# Patient Record
Sex: Female | Born: 1990
Health system: Southern US, Community
[De-identification: ages and names within clinical notes are randomized; demographics above are authoritative.]

## PROBLEM LIST (undated history)

## (undated) ENCOUNTER — Inpatient Hospital Stay (HOSPITAL_COMMUNITY): Payer: Self-pay

## (undated) DIAGNOSIS — R87619 Unspecified abnormal cytological findings in specimens from cervix uteri: Secondary | ICD-10-CM

## (undated) DIAGNOSIS — R87629 Unspecified abnormal cytological findings in specimens from vagina: Secondary | ICD-10-CM

## (undated) DIAGNOSIS — IMO0002 Reserved for concepts with insufficient information to code with codable children: Secondary | ICD-10-CM

## (undated) DIAGNOSIS — F419 Anxiety disorder, unspecified: Secondary | ICD-10-CM

## (undated) DIAGNOSIS — L709 Acne, unspecified: Secondary | ICD-10-CM

## (undated) DIAGNOSIS — N390 Urinary tract infection, site not specified: Secondary | ICD-10-CM

## (undated) DIAGNOSIS — I959 Hypotension, unspecified: Secondary | ICD-10-CM

## (undated) DIAGNOSIS — F329 Major depressive disorder, single episode, unspecified: Secondary | ICD-10-CM

## (undated) DIAGNOSIS — F32A Depression, unspecified: Secondary | ICD-10-CM

## (undated) HISTORY — PX: WISDOM TOOTH EXTRACTION: SHX21

---
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
## 2009-01-09 ENCOUNTER — Emergency Department (HOSPITAL_COMMUNITY): Admission: EM | Admit: 2009-01-09 | Discharge: 2009-01-09 | Payer: Self-pay | Admitting: Emergency Medicine

## 2009-05-20 ENCOUNTER — Emergency Department (HOSPITAL_COMMUNITY): Admission: EM | Admit: 2009-05-20 | Discharge: 2009-05-20 | Payer: Self-pay | Admitting: Family Medicine

## 2009-09-14 ENCOUNTER — Emergency Department (HOSPITAL_COMMUNITY): Admission: EM | Admit: 2009-09-14 | Discharge: 2009-09-14 | Payer: Self-pay | Admitting: Emergency Medicine

## 2009-11-07 ENCOUNTER — Emergency Department (HOSPITAL_COMMUNITY): Admission: EM | Admit: 2009-11-07 | Discharge: 2009-11-07 | Payer: Self-pay | Admitting: Family Medicine

## 2009-11-23 ENCOUNTER — Emergency Department (HOSPITAL_COMMUNITY): Admission: EM | Admit: 2009-11-23 | Discharge: 2009-11-23 | Payer: Self-pay | Admitting: Emergency Medicine

## 2009-12-03 ENCOUNTER — Emergency Department (HOSPITAL_COMMUNITY): Admission: EM | Admit: 2009-12-03 | Discharge: 2009-12-03 | Payer: Self-pay | Admitting: Emergency Medicine

## 2010-11-28 ENCOUNTER — Encounter: Payer: Self-pay | Admitting: *Deleted

## 2011-01-23 LAB — POCT URINALYSIS DIP (DEVICE)
Bilirubin Urine: NEGATIVE
Glucose, UA: NEGATIVE mg/dL
Ketones, ur: NEGATIVE mg/dL
Nitrite: NEGATIVE
Protein, ur: NEGATIVE mg/dL
Specific Gravity, Urine: 1.025 (ref 1.005–1.030)
Urobilinogen, UA: 0.2 mg/dL (ref 0.0–1.0)
pH: 6.5 (ref 5.0–8.0)

## 2011-01-23 LAB — GC/CHLAMYDIA PROBE AMP, GENITAL
Chlamydia, DNA Probe: NEGATIVE
GC Probe Amp, Genital: NEGATIVE

## 2011-01-23 LAB — WET PREP, GENITAL
Clue Cells Wet Prep HPF POC: NONE SEEN
Yeast Wet Prep HPF POC: NONE SEEN

## 2011-01-23 LAB — POCT PREGNANCY, URINE: Preg Test, Ur: NEGATIVE

## 2011-02-13 LAB — POCT URINALYSIS DIP (DEVICE)
Bilirubin Urine: NEGATIVE
Glucose, UA: NEGATIVE mg/dL
Hgb urine dipstick: NEGATIVE
Ketones, ur: NEGATIVE mg/dL
Nitrite: NEGATIVE
Protein, ur: NEGATIVE mg/dL
Specific Gravity, Urine: 1.025 (ref 1.005–1.030)
Urobilinogen, UA: 0.2 mg/dL (ref 0.0–1.0)
pH: 6.5 (ref 5.0–8.0)

## 2011-02-13 LAB — WET PREP, GENITAL
Trich, Wet Prep: NONE SEEN
Yeast Wet Prep HPF POC: NONE SEEN

## 2011-02-13 LAB — POCT PREGNANCY, URINE: Preg Test, Ur: NEGATIVE

## 2011-02-13 LAB — GC/CHLAMYDIA PROBE AMP, GENITAL
Chlamydia, DNA Probe: NEGATIVE
GC Probe Amp, Genital: NEGATIVE

## 2011-02-17 LAB — POCT RAPID STREP A (OFFICE): Streptococcus, Group A Screen (Direct): POSITIVE — AB

## 2011-02-22 LAB — URINE MICROSCOPIC-ADD ON

## 2011-02-22 LAB — URINALYSIS, ROUTINE W REFLEX MICROSCOPIC
Bilirubin Urine: NEGATIVE
Glucose, UA: NEGATIVE mg/dL
Hgb urine dipstick: NEGATIVE
Ketones, ur: 15 mg/dL — AB
Leukocytes, UA: NEGATIVE
Nitrite: NEGATIVE
Protein, ur: 30 mg/dL — AB
Specific Gravity, Urine: 1.035 — ABNORMAL HIGH (ref 1.005–1.030)
Urobilinogen, UA: 0.2 mg/dL (ref 0.0–1.0)
pH: 6 (ref 5.0–8.0)

## 2011-02-22 LAB — POCT I-STAT, CHEM 8
BUN: 8 mg/dL (ref 6–23)
Calcium, Ion: 1.14 mmol/L (ref 1.12–1.32)
Chloride: 113 mEq/L — ABNORMAL HIGH (ref 96–112)
Creatinine, Ser: 0.8 mg/dL (ref 0.4–1.2)
Glucose, Bld: 92 mg/dL (ref 70–99)
HCT: 41 % (ref 36.0–46.0)
Hemoglobin: 13.9 g/dL (ref 12.0–15.0)
Potassium: 3.2 mEq/L — ABNORMAL LOW (ref 3.5–5.1)
Sodium: 139 mEq/L (ref 135–145)
TCO2: 15 mmol/L (ref 0–100)

## 2011-02-22 LAB — POCT PREGNANCY, URINE: Preg Test, Ur: NEGATIVE

## 2011-03-22 NOTE — Op Note (Signed)
NAMEWANDRA, BABIN                 ACCOUNT NO.:  [id number]   MEDICAL RECORD NO.:  [id number]          PATIENT TYPE:  INP   LOCATION:  9129                          FACILITY:  [facility]   PHYSICIAN:  Lazaro Arms, M.D.   DATE OF BIRTH:  June 02, 1991   DATE OF PROCEDURE:  06/04/2007  DATE OF DISCHARGE:                               OPERATIVE REPORT   DELIVERY NOTE   Victoria Wright is a 20 year old African-American female gravida 1, para 0,  abortus 0 with estimate date of delivery of June 05, 2007, currently at  39-6/7 weeks' gestation, who was admitted today with some bloody show in  early labor, underwent an amniotomy and was augmented.  She progressed  slowly throughout the day, was 4 cm at about 10 o'clock this morning and  was complete and started pushing at 0030. She had approximately 40  minute second stage of labor.  She had an epidural placed.  The patient  delivered over a midline episiotomy a viable female at 3 with Apgars of  7 and 9 weighing 8 pounds and 10 ounces.  There was a three-vessel cord.  Cord blood and cord gas were sent.  However, they said the cord gas was  insufficient for study.  A midline episiotomy was without extension and  was repaired with 3-0 Monocryl in the usual fashion without difficulty.  The uterus was firm at the umbilicus.  Blood loss for delivery was 300  mL.  Again, the uterus was firm.  The infant will undergo routine  neonatal care.  Of note, the patient did have a temperature right at  time of delivery of 100.6.  An hour before delivery it was 99.5.  The  nursery will be made aware of that.  She has not received any  antibiotics. Temperature just went up right at the end.  The patient  will undergo routine postpartum care.      Lazaro Arms, M.D.  Electronically Signed     LHE/MEDQ  D:  06/04/2007  T:  06/04/2007  Job:  [id number]
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
## 2011-04-20 ENCOUNTER — Emergency Department (HOSPITAL_COMMUNITY)
Admission: EM | Admit: 2011-04-20 | Discharge: 2011-04-21 | Disposition: A | Discharge status: Home / Self Care | Payer: Self-pay | Attending: Emergency Medicine | Admitting: Emergency Medicine

## 2011-04-20 DIAGNOSIS — R42 Dizziness and giddiness: Secondary | ICD-10-CM | POA: Insufficient documentation

## 2011-04-20 DIAGNOSIS — N898 Other specified noninflammatory disorders of vagina: Secondary | ICD-10-CM | POA: Insufficient documentation

## 2011-04-20 DIAGNOSIS — B9689 Other specified bacterial agents as the cause of diseases classified elsewhere: Secondary | ICD-10-CM | POA: Insufficient documentation

## 2011-04-20 DIAGNOSIS — N760 Acute vaginitis: Secondary | ICD-10-CM | POA: Insufficient documentation

## 2011-04-20 DIAGNOSIS — N9489 Other specified conditions associated with female genital organs and menstrual cycle: Secondary | ICD-10-CM | POA: Insufficient documentation

## 2011-04-20 DIAGNOSIS — A499 Bacterial infection, unspecified: Secondary | ICD-10-CM | POA: Insufficient documentation

## 2011-04-20 LAB — BASIC METABOLIC PANEL
BUN: 14 mg/dL (ref 6–23)
CO2: 25 mEq/L (ref 19–32)
Calcium: 9.1 mg/dL (ref 8.4–10.5)
Chloride: 104 mEq/L (ref 96–112)
Creatinine, Ser: 0.62 mg/dL (ref 0.4–1.2)
GFR calc Af Amer: >60 mL/min (ref >60)
GFR calc non Af Amer: >60 mL/min (ref >60)
Glucose, Bld: 88 mg/dL (ref 70–99)
Potassium: 3.8 mEq/L (ref 3.5–5.1)
Sodium: 137 mEq/L (ref 135–145)

## 2011-04-20 LAB — DIFFERENTIAL
Basophils Absolute: 0 10*3/uL (ref 0.0–0.1)
Basophils Relative: 1 % (ref 0–1)
Eosinophils Absolute: 0.2 10*3/uL (ref 0.0–0.7)
Eosinophils Relative: 3 % (ref 0–5)
Lymphocytes Relative: 36 % (ref 12–46)
Lymphs Abs: 1.9 10*3/uL (ref 0.7–4.0)
Monocytes Absolute: 0.5 10*3/uL (ref 0.1–1.0)
Monocytes Relative: 9 % (ref 3–12)
Neutro Abs: 2.7 10*3/uL (ref 1.7–7.7)
Neutrophils Relative %: 52 % (ref 43–77)

## 2011-04-20 LAB — CBC
HCT: 34.7 % — ABNORMAL LOW (ref 36.0–46.0)
Hemoglobin: 12.2 g/dL (ref 12.0–15.0)
MCH: 30.7 pg (ref 26.0–34.0)
MCHC: 35.2 g/dL (ref 30.0–36.0)
MCV: 87.2 fL (ref 78.0–100.0)
Platelets: 224 10*3/uL (ref 150–400)
RBC: 3.98 MIL/uL (ref 3.87–5.11)
RDW: 11.9 % (ref 11.5–15.5)
WBC: 5.3 10*3/uL (ref 4.0–10.5)

## 2011-04-21 LAB — URINALYSIS, ROUTINE W REFLEX MICROSCOPIC
Bilirubin Urine: NEGATIVE
Glucose, UA: NEGATIVE mg/dL
Hgb urine dipstick: NEGATIVE
Ketones, ur: NEGATIVE mg/dL
Nitrite: NEGATIVE
Protein, ur: NEGATIVE mg/dL
Specific Gravity, Urine: 1.027 (ref 1.005–1.030)
Urobilinogen, UA: 1 mg/dL (ref 0.0–1.0)
pH: 7 (ref 5.0–8.0)

## 2011-04-21 LAB — WET PREP, GENITAL
Trich, Wet Prep: NONE SEEN
Yeast Wet Prep HPF POC: NONE SEEN

## 2011-04-21 LAB — URINE MICROSCOPIC-ADD ON

## 2011-04-21 LAB — POCT PREGNANCY, URINE: Preg Test, Ur: NEGATIVE

## 2011-04-22 LAB — GC/CHLAMYDIA PROBE AMP, GENITAL
Chlamydia, DNA Probe: NEGATIVE
GC Probe Amp, Genital: NEGATIVE

## 2011-05-14 ENCOUNTER — Inpatient Hospital Stay (INDEPENDENT_AMBULATORY_CARE_PROVIDER_SITE_OTHER)
Admission: RE | Admit: 2011-05-14 | Discharge: 2011-05-14 | Disposition: A | Discharge status: Home / Self Care | Payer: Self-pay | Source: Ambulatory Visit | Attending: Family Medicine | Admitting: Family Medicine

## 2011-05-14 DIAGNOSIS — A09 Infectious gastroenteritis and colitis, unspecified: Secondary | ICD-10-CM

## 2011-05-14 DIAGNOSIS — R197 Diarrhea, unspecified: Secondary | ICD-10-CM

## 2011-05-14 DIAGNOSIS — B9689 Other specified bacterial agents as the cause of diseases classified elsewhere: Secondary | ICD-10-CM

## 2011-05-14 DIAGNOSIS — R109 Unspecified abdominal pain: Secondary | ICD-10-CM

## 2011-05-14 DIAGNOSIS — R10819 Abdominal tenderness, unspecified site: Secondary | ICD-10-CM

## 2011-05-14 LAB — CLOSTRIDIUM DIFFICILE BY PCR: Toxigenic C. Difficile by PCR: POSITIVE — AB

## 2011-05-16 LAB — GIARDIA/CRYPTOSPORIDIUM SCREEN(EIA)
Cryptosporidium Screen (EIA): NEGATIVE
Giardia Screen - EIA: NEGATIVE

## 2011-05-18 ENCOUNTER — Emergency Department (HOSPITAL_COMMUNITY)
Admission: EM | Admit: 2011-05-18 | Discharge: 2011-05-18 | Disposition: A | Discharge status: Home / Self Care | Payer: Self-pay | Attending: Emergency Medicine | Admitting: Emergency Medicine

## 2011-05-18 DIAGNOSIS — Y92009 Unspecified place in unspecified non-institutional (private) residence as the place of occurrence of the external cause: Secondary | ICD-10-CM | POA: Insufficient documentation

## 2011-05-18 DIAGNOSIS — L270 Generalized skin eruption due to drugs and medicaments taken internally: Secondary | ICD-10-CM | POA: Insufficient documentation

## 2011-05-18 DIAGNOSIS — A0472 Enterocolitis due to Clostridium difficile, not specified as recurrent: Secondary | ICD-10-CM | POA: Insufficient documentation

## 2011-05-18 DIAGNOSIS — F17200 Nicotine dependence, unspecified, uncomplicated: Secondary | ICD-10-CM | POA: Insufficient documentation

## 2011-05-18 DIAGNOSIS — H5789 Other specified disorders of eye and adnexa: Secondary | ICD-10-CM | POA: Insufficient documentation

## 2011-05-18 DIAGNOSIS — T368X5A Adverse effect of other systemic antibiotics, initial encounter: Secondary | ICD-10-CM | POA: Insufficient documentation

## 2011-05-18 LAB — STOOL CULTURE

## 2011-06-06 ENCOUNTER — Emergency Department (HOSPITAL_COMMUNITY)
Admission: EM | Admit: 2011-06-06 | Discharge: 2011-06-07 | Disposition: A | Discharge status: Home / Self Care | Payer: Medicaid Other | Attending: Emergency Medicine | Admitting: Emergency Medicine

## 2011-06-06 DIAGNOSIS — R350 Frequency of micturition: Secondary | ICD-10-CM | POA: Insufficient documentation

## 2011-06-06 DIAGNOSIS — N898 Other specified noninflammatory disorders of vagina: Secondary | ICD-10-CM | POA: Insufficient documentation

## 2011-06-06 DIAGNOSIS — R109 Unspecified abdominal pain: Secondary | ICD-10-CM | POA: Insufficient documentation

## 2011-06-06 DIAGNOSIS — O99891 Other specified diseases and conditions complicating pregnancy: Secondary | ICD-10-CM | POA: Insufficient documentation

## 2011-06-06 DIAGNOSIS — R112 Nausea with vomiting, unspecified: Secondary | ICD-10-CM | POA: Insufficient documentation

## 2011-06-06 LAB — POCT PREGNANCY, URINE: Preg Test, Ur: NEGATIVE

## 2011-06-06 LAB — URINALYSIS, ROUTINE W REFLEX MICROSCOPIC
Bilirubin Urine: NEGATIVE
Glucose, UA: NEGATIVE mg/dL
Hgb urine dipstick: NEGATIVE
Ketones, ur: NEGATIVE mg/dL
Leukocytes, UA: NEGATIVE
Nitrite: NEGATIVE
Protein, ur: NEGATIVE mg/dL
Specific Gravity, Urine: 1.031 — ABNORMAL HIGH (ref 1.005–1.030)
Urobilinogen, UA: 0.2 mg/dL (ref 0.0–1.0)
pH: 6.5 (ref 5.0–8.0)

## 2011-06-07 LAB — COMPREHENSIVE METABOLIC PANEL
ALT: 14 U/L (ref 0–35)
AST: 13 U/L (ref 0–37)
Albumin: 3.8 g/dL (ref 3.5–5.2)
Alkaline Phosphatase: 57 U/L (ref 39–117)
BUN: 12 mg/dL (ref 6–23)
CO2: 23 mEq/L (ref 19–32)
Calcium: 8.8 mg/dL (ref 8.4–10.5)
Chloride: 104 mEq/L (ref 96–112)
Creatinine, Ser: 0.57 mg/dL (ref 0.50–1.10)
GFR calc Af Amer: >60 mL/min (ref >60)
GFR calc non Af Amer: >60 mL/min (ref >60)
Glucose, Bld: 97 mg/dL (ref 70–99)
Potassium: 3.4 mEq/L — ABNORMAL LOW (ref 3.5–5.1)
Sodium: 136 mEq/L (ref 135–145)
Total Bilirubin: 0.1 mg/dL — ABNORMAL LOW (ref 0.3–1.2)
Total Protein: 7.1 g/dL (ref 6.0–8.3)

## 2011-06-07 LAB — CBC
HCT: 34.1 % — ABNORMAL LOW (ref 36.0–46.0)
Hemoglobin: 12 g/dL (ref 12.0–15.0)
MCH: 30.8 pg (ref 26.0–34.0)
MCHC: 35.2 g/dL (ref 30.0–36.0)
MCV: 87.4 fL (ref 78.0–100.0)
Platelets: 200 10*3/uL (ref 150–400)
RBC: 3.9 MIL/uL (ref 3.87–5.11)
RDW: 12.5 % (ref 11.5–15.5)
WBC: 6.6 10*3/uL (ref 4.0–10.5)

## 2011-06-07 LAB — DIFFERENTIAL
Basophils Absolute: 0 10*3/uL (ref 0.0–0.1)
Basophils Relative: 1 % (ref 0–1)
Eosinophils Absolute: 0.2 10*3/uL (ref 0.0–0.7)
Eosinophils Relative: 2 % (ref 0–5)
Lymphocytes Relative: 37 % (ref 12–46)
Lymphs Abs: 2.5 10*3/uL (ref 0.7–4.0)
Monocytes Absolute: 0.6 10*3/uL (ref 0.1–1.0)
Monocytes Relative: 9 % (ref 3–12)
Neutro Abs: 3.4 10*3/uL (ref 1.7–7.7)
Neutrophils Relative %: 51 % (ref 43–77)

## 2011-06-07 LAB — RPR: RPR Ser Ql: NONREACTIVE

## 2011-06-07 LAB — HCG, QUANTITATIVE, PREGNANCY: hCG, Beta Chain, Quant, S: 41 m[IU]/mL — ABNORMAL HIGH (ref <5)

## 2011-06-07 LAB — LIPASE, BLOOD: Lipase: 34 U/L (ref 11–59)

## 2011-06-10 ENCOUNTER — Encounter (HOSPITAL_COMMUNITY): Payer: Self-pay

## 2011-06-10 ENCOUNTER — Inpatient Hospital Stay (HOSPITAL_COMMUNITY)
Admission: AD | Admit: 2011-06-10 | Discharge: 2011-06-10 | Disposition: A | Discharge status: Home / Self Care | Payer: Medicaid Other | Source: Ambulatory Visit | Attending: Obstetrics and Gynecology | Admitting: Obstetrics and Gynecology

## 2011-06-10 DIAGNOSIS — O99891 Other specified diseases and conditions complicating pregnancy: Secondary | ICD-10-CM | POA: Insufficient documentation

## 2011-06-10 DIAGNOSIS — R109 Unspecified abdominal pain: Secondary | ICD-10-CM | POA: Insufficient documentation

## 2011-06-10 DIAGNOSIS — O26899 Other specified pregnancy related conditions, unspecified trimester: Secondary | ICD-10-CM

## 2011-06-10 DIAGNOSIS — O210 Mild hyperemesis gravidarum: Secondary | ICD-10-CM | POA: Insufficient documentation

## 2011-06-10 LAB — HCG, QUANTITATIVE, PREGNANCY: hCG, Beta Chain, Quant, S: 311 m[IU]/mL — ABNORMAL HIGH (ref <5)

## 2011-06-10 MED ORDER — PROMETHAZINE HCL 25 MG PO TABS: 25.0000 mg | ORAL_TABLET | Freq: Four times a day (QID) | ORAL | Status: DC | PRN

## 2011-06-10 NOTE — Progress Notes (Signed)
Pt states she was seen at [facility] ED on 7-31. Was told to come to MAU today for repeat BHCG. Pt reports no pain or bleeding.

## 2011-06-10 NOTE — ED Provider Notes (Addendum)
History     Chief Complaint  Patient presents with  . Follow-up   HPI Pt here for f/u HCG - was seen on 06/06/11 with abdominal pain and vomiting and positive UPT with HCG 41 Today is here for repeat HCG and is 311.  Pt denies pain or bleeding.    No past medical history on file.  No past surgical history on file.  No family history on file.  History  Substance Use Topics  . Smoking status: Not on file  . Smokeless tobacco: Not on file  . Alcohol Use: Not on file    Allergies: Allergies not on file  No prescriptions prior to admission    ROS Physical Exam   Blood pressure 126/64, pulse 102, temperature 98.8 F (37.1 C), temperature source Oral, resp. rate 16, height 5\' 5"  (1.651 m), weight 170 lb 3.2 oz (77.202 kg), last menstrual period 05/09/2011, SpO2 99.00%.  Physical Exam  MAU Course  Procedures Repeat HCG today is 311 from 41 MDM   Assessment and Plan  Early pregnancy with abdominal pain Repeat HCG 48 hours  Javion Holmer 06/10/2011, 10:06 AM

## 2011-06-12 ENCOUNTER — Encounter (HOSPITAL_COMMUNITY): Payer: Self-pay | Admitting: Family

## 2011-06-12 ENCOUNTER — Inpatient Hospital Stay (HOSPITAL_COMMUNITY)
Admission: AD | Admit: 2011-06-12 | Discharge: 2011-06-12 | Disposition: A | Discharge status: Home / Self Care | Payer: Medicaid Other | Source: Ambulatory Visit | Attending: Obstetrics & Gynecology | Admitting: Obstetrics & Gynecology

## 2011-06-12 DIAGNOSIS — Z331 Pregnant state, incidental: Secondary | ICD-10-CM

## 2011-06-12 DIAGNOSIS — O99891 Other specified diseases and conditions complicating pregnancy: Secondary | ICD-10-CM | POA: Insufficient documentation

## 2011-06-12 LAB — HCG, QUANTITATIVE, PREGNANCY: hCG, Beta Chain, Quant, S: 740 m[IU]/mL — ABNORMAL HIGH (ref <5)

## 2011-06-12 NOTE — ED Provider Notes (Signed)
History     Chief Complaint  Patient presents with  . Follow-up   HPI  Here for repeat BHCG.  Denies any history of abdominal pain or vaginal bleeding.    History reviewed. No pertinent past medical history.  Past Surgical History  Procedure Date  . Wisdom tooth extraction     Family History  Problem Relation Age of Onset  . Hypertension Mother   . Diabetes Maternal Grandmother   . Cancer Maternal Grandfather     History  Substance Use Topics  . Smoking status: Former Games developer  . Smokeless tobacco: Not on file  . Alcohol Use: No    Allergies: No Known Allergies  Prescriptions prior to admission  Medication Sig Dispense Refill  . promethazine (PHENERGAN) 25 MG tablet Take 1 tablet (25 mg total) by mouth every 6 (six) hours as needed for nausea.  30 tablet  0    ROS Negative  Physical Exam   Blood pressure 126/67, pulse 87, temperature 99.8 F (37.7 C), temperature source Oral, resp. rate 18, last menstrual period 05/09/2011.  Physical Exam  Constitutional: She is oriented to person, place, and time. She appears well-developed and well-nourished.  HENT:  Head: Normocephalic.  Neck: Normal range of motion. Neck supple.  Neurological: She is alert and oriented to person, place, and time. She has normal reflexes.  Skin: Skin is warm and dry.    MAU Course  Procedures   Assessment and Plan  Pregnancy  Begin prenatal care as soon as possible.   [facility] 06/12/2011, 11:00 PM

## 2011-06-12 NOTE — Progress Notes (Signed)
Returns for repeat labs, denies problems, denies pain

## 2011-07-08 ENCOUNTER — Encounter (HOSPITAL_COMMUNITY): Payer: Self-pay

## 2011-07-08 ENCOUNTER — Inpatient Hospital Stay (HOSPITAL_COMMUNITY)
Admission: AD | Admit: 2011-07-08 | Discharge: 2011-07-09 | Disposition: A | Discharge status: Home / Self Care | Payer: Medicaid Other | Source: Ambulatory Visit | Attending: Obstetrics and Gynecology | Admitting: Obstetrics and Gynecology

## 2011-07-08 DIAGNOSIS — O039 Complete or unspecified spontaneous abortion without complication: Secondary | ICD-10-CM

## 2011-07-08 DIAGNOSIS — O021 Missed abortion: Secondary | ICD-10-CM | POA: Insufficient documentation

## 2011-07-08 HISTORY — DX: Urinary tract infection, site not specified: N39.0

## 2011-07-08 NOTE — Progress Notes (Signed)
Patient states that she has been followed for rising hbcg level. She had intercourse 2 days ago and today started having pink vaginal bleeding. Denies any pain or cramping.

## 2011-07-08 NOTE — Progress Notes (Signed)
Pt states, " I've had spotting off and on today. It is pink, and only when I wipe."

## 2011-07-09 ENCOUNTER — Encounter (HOSPITAL_COMMUNITY): Payer: Self-pay | Admitting: Family

## 2011-07-09 ENCOUNTER — Inpatient Hospital Stay (HOSPITAL_COMMUNITY): Payer: Medicaid Other

## 2011-07-09 LAB — URINALYSIS, ROUTINE W REFLEX MICROSCOPIC
Bilirubin Urine: NEGATIVE
Glucose, UA: NEGATIVE mg/dL
Hgb urine dipstick: NEGATIVE
Ketones, ur: NEGATIVE mg/dL
Leukocytes, UA: NEGATIVE
Nitrite: NEGATIVE
Protein, ur: NEGATIVE mg/dL
Specific Gravity, Urine: 1.025 (ref 1.005–1.030)
Urobilinogen, UA: 0.2 mg/dL (ref 0.0–1.0)
pH: 6 (ref 5.0–8.0)

## 2011-07-09 LAB — ABO/RH: ABO/RH(D): O POS

## 2011-07-09 LAB — CBC
HCT: 34.4 % — ABNORMAL LOW (ref 36.0–46.0)
Hemoglobin: 11.7 g/dL — ABNORMAL LOW (ref 12.0–15.0)
MCH: 30.8 pg (ref 26.0–34.0)
MCHC: 34 g/dL (ref 30.0–36.0)
MCV: 90.5 fL (ref 78.0–100.0)
Platelets: 224 10*3/uL (ref 150–400)
RBC: 3.8 MIL/uL — ABNORMAL LOW (ref 3.87–5.11)
RDW: 12.6 % (ref 11.5–15.5)
WBC: 5.7 10*3/uL (ref 4.0–10.5)

## 2011-07-09 LAB — WET PREP, GENITAL
Trich, Wet Prep: NONE SEEN
Yeast Wet Prep HPF POC: NONE SEEN

## 2011-07-09 LAB — HCG, QUANTITATIVE, PREGNANCY: hCG, Beta Chain, Quant, S: 9100 m[IU]/mL — ABNORMAL HIGH (ref <5)

## 2011-07-09 MED ORDER — MISOPROSTOL 200 MCG PO TABS
800.0000 ug | ORAL_TABLET | Freq: Once | ORAL | Status: AC
  Administered 2011-07-09: 800 ug via VAGINAL
  Filled 2011-07-09: qty 4

## 2011-07-09 NOTE — ED Provider Notes (Signed)
History     Chief Complaint  Patient presents with  . Vaginal Bleeding   HPI  Pt presented for repeat BHCG; +vaginal bleeding that started today.  Denies clots or pelvic pain.  Previously seen in MAU for nausea and pelvic pain.  No ultrasound completed, however, have been following HCGs.   Past Medical History  Diagnosis Date  . UTI (lower urinary tract infection)     Past Surgical History  Procedure Date  . Wisdom tooth extraction     Family History  Problem Relation Age of Onset  . Hypertension Mother   . Diabetes Maternal Grandmother   . Cancer Maternal Grandfather     History  Substance Use Topics  . Smoking status: Former Games developer  . Smokeless tobacco: Not on file  . Alcohol Use: No    Allergies: No Known Allergies  Prescriptions prior to admission  Medication Sig Dispense Refill  . prenatal vitamin w/FE, FA (PRENATAL 1 + 1) 27-1 MG TABS Take 1 tablet by mouth daily.          Review of Systems  Genitourinary:       Vaginal bleeding  All other systems reviewed and are negative.   Physical Exam   Blood pressure 122/60, pulse 76, temperature 99.8 F (37.7 C), temperature source Oral, resp. rate 18, height 5\' 5"  (1.651 m), weight 79.096 kg (174 lb 6 oz), last menstrual period 05/09/2011.  Physical Exam  Constitutional: She is oriented to person, place, and time. She appears well-developed and well-nourished.  HENT:  Head: Normocephalic.  Neck: Normal range of motion. Neck supple.  Cardiovascular: Normal rate, regular rhythm and normal heart sounds.   Respiratory: Effort normal and breath sounds normal.  GI: Soft. She exhibits no mass. There is tenderness. There is no guarding.  Genitourinary: Cervix exhibits no motion tenderness. Right adnexum displays no mass and no tenderness. Left adnexum displays no mass and no tenderness. There is bleeding (negative clots) around the vagina.  Neurological: She is alert and oriented to person, place, and time. She has  normal reflexes.  Skin: Skin is warm and dry.    MAU Course  Procedures  [place] - IUFD  Assessment and Plan  IUFD at 7 wks IUP  Plan: Cytotec placed in MAU ( ) OTC Ibuprofen 600 mg every 8 hours prn pain F/U in MAU in 2 wks for f/u quant Given bleeding precautions  [facility]   [facility] 07/09/2011, 3:20 AM

## 2011-07-09 NOTE — Consult Note (Signed)
Dr. Emelda Fear here in MAU and reviews ultrasound>offer pt cytotec.

## 2011-07-11 NOTE — ED Provider Notes (Signed)
Discussed with Roney Marion, CNM during decisionmaking

## 2011-07-12 LAB — GC/CHLAMYDIA PROBE AMP, GENITAL
Chlamydia, DNA Probe: NEGATIVE
GC Probe Amp, Genital: NEGATIVE

## 2011-07-22 ENCOUNTER — Inpatient Hospital Stay (HOSPITAL_COMMUNITY): Admit: 2011-07-22 | Payer: Self-pay

## 2011-07-27 ENCOUNTER — Inpatient Hospital Stay (HOSPITAL_COMMUNITY)
Admission: AD | Admit: 2011-07-27 | Discharge: 2011-07-27 | Disposition: A | Discharge status: Home / Self Care | Payer: Medicaid Other | Source: Ambulatory Visit | Attending: Obstetrics & Gynecology | Admitting: Obstetrics & Gynecology

## 2011-07-27 ENCOUNTER — Encounter (HOSPITAL_COMMUNITY): Payer: Self-pay | Admitting: *Deleted

## 2011-07-27 DIAGNOSIS — O039 Complete or unspecified spontaneous abortion without complication: Secondary | ICD-10-CM

## 2011-07-27 LAB — WET PREP, GENITAL
Clue Cells Wet Prep HPF POC: NONE SEEN
Trich, Wet Prep: NONE SEEN
Yeast Wet Prep HPF POC: NONE SEEN

## 2011-07-27 LAB — HCG, QUANTITATIVE, PREGNANCY: hCG, Beta Chain, Quant, S: 4 m[IU]/mL (ref <5)

## 2011-07-27 MED ORDER — NORGESTIMATE-ETH ESTRADIOL 0.25-35 MG-MCG PO TABS: 1.0000 | ORAL_TABLET | Freq: Every day | ORAL | Status: DC

## 2011-07-27 NOTE — ED Provider Notes (Signed)
History     CSN: [id number] Arrival date & time: 07/27/2011  9:11 AM   Chief Complaint  Patient presents with  . Labs Only     (Include location/radiation/quality/duration/timing/severity/associated sxs/prior treatment) HPI Victoria Wright is a 20 y.o. female who presents to MAU for follow up after SAB. She had sexual intercourse 3 days ago and now is having a vaginal discharge and itching. Request STD testing. Bhcg today is 4. Pt. Request birht control. The history is provided by the patient.   Past Medical History  Diagnosis Date  . UTI (lower urinary tract infection)   . No pertinent past medical history      Past Surgical History  Procedure Date  . Wisdom tooth extraction     Family History  Problem Relation Age of Onset  . Hypertension Mother   . Diabetes Maternal Grandmother   . Cancer Maternal Grandfather     History  Substance Use Topics  . Smoking status: Current Everyday Smoker -- 0.2 packs/day  . Smokeless tobacco: Not on file  . Alcohol Use: No    OB History    Grav Para Term Preterm Abortions TAB SAB Ect Mult Living   3 1 1  1  1   1       Review of Systems  Genitourinary: Positive for vaginal bleeding and vaginal discharge.  All other systems reviewed and are negative.    Allergies  Review of patient's allergies indicates no known allergies.  Home Medications  No current outpatient prescriptions on file.  Physical Exam    BP 113/63  Pulse 66  Temp(Src) 98.5 F (36.9 C) (Oral)  Resp 16  Ht 5\' 5"  (1.651 m)  Wt 175 lb 4 oz (79.493 kg)  BMI 29.16 kg/m2  LMP 05/09/2011  Physical Exam  Nursing note and vitals reviewed. Constitutional: She is oriented to person, place, and time. She appears well-developed and well-nourished.  Eyes: EOM are normal.  Neck: Neck supple.  Pulmonary/Chest: Effort normal.  Abdominal: Soft. There is no tenderness.  Musculoskeletal: Normal range of motion.  Neurological: She is alert and oriented to person,  place, and time. No cranial nerve deficit.  Skin: Skin is warm and dry.    ED Course  Procedures Results for orders placed during the hospital encounter of 07/27/11 (from the past 24 hour(s))  HCG, QUANTITATIVE, PREGNANCY     Status: Normal   Collection Time   07/27/11  9:30 AM      Component Value Range   hCG, Beta Chain, Quant, S 4  <5 (mIU/mL)  WET PREP, GENITAL     Status: Abnormal   Collection Time   07/27/11 11:45 AM      Component Value Range   Yeast, Wet Prep NONE SEEN  NONE SEEN    Trich, Wet Prep NONE SEEN  NONE SEEN    Clue Cells, Wet Prep NONE SEEN  NONE SEEN    WBC, Wet Prep HPF POC FEW (*) NONE SEEN    Results for orders placed during the hospital encounter of 07/27/11  HCG, QUANTITATIVE, PREGNANCY      Component Value Range   hCG, Beta Chain, Quant, S 4  <5 (mIU/mL)   US Ob Comp Less 14 Wks  07/09/2011  *RADIOLOGY REPORT*  Clinical Data: Vaginal bleeding and pain.  LMP 05/09/2011.  OBSTETRIC <14 WK [place] AND TRANSVAGINAL OB US  Technique:  Both transabdominal and transvaginal ultrasound examinations were performed for complete evaluation of the gestation as well as the  maternal uterus, adnexal regions, and pelvic cul-de-sac.  Transvaginal technique was performed to assess early pregnancy.  Comparison:  None.  Intrauterine gestational sac:  A single ovoid intrauterine gestational sac is visualized. Amniotic fluid volume appears somewhat limited. Yolk sac: Present Embryo: Present, no fetal motion identified. Cardiac Activity: Not demonstrated Heart Rate: Not demonstrated bpm  CRL: 12.6   mm  7   w  4   d        [place] EDC: 02/21/2012  Maternal uterus/adnexae:   No subchorionic hemorrhage demonstrated.  Left ovary measures 4 x 1.7 x 2.5 cm and contains normal follicular changes.  Right ovary measures 3.3 x 2.1 x 2.1 cm and contains normal follicular changes. No free pelvic fluid collections.  IMPRESSION: Somewhat small appearance to the gestational sac with fetal pole and yolk sac  visualized.  However, no fetal motion or cardiac activity can be observed.  Crown-rump length is consistent with estimated gestational age [redacted] weeks 4 days.  This is somewhat small with respect to the maternal clinical dates.  Early intrauterine fetal demise should be excluded.  Follow-up with serial quantitative beta HCG and/or short-term ultrasound in 2 weeks recommended as clinically indicated.  Results were discussed with Dr. Emelda Fear at the time of dictation, 0152 hours on 07/09/2011.  Original Report Authenticated By: Marlon Pel, M.D.   US Ob Transvaginal  07/09/2011  *RADIOLOGY REPORT*  Clinical Data: Vaginal bleeding and pain.  LMP 05/09/2011.  OBSTETRIC <14 WK [place] AND TRANSVAGINAL OB US  Technique:  Both transabdominal and transvaginal ultrasound examinations were performed for complete evaluation of the gestation as well as the maternal uterus, adnexal regions, and pelvic cul-de-sac.  Transvaginal technique was performed to assess early pregnancy.  Comparison:  None.  Intrauterine gestational sac:  A single ovoid intrauterine gestational sac is visualized. Amniotic fluid volume appears somewhat limited. Yolk sac: Present Embryo: Present, no fetal motion identified. Cardiac Activity: Not demonstrated Heart Rate: Not demonstrated bpm  CRL: 12.6   mm  7   w  4   d        [place] EDC: 02/21/2012  Maternal uterus/adnexae:   No subchorionic hemorrhage demonstrated.  Left ovary measures 4 x 1.7 x 2.5 cm and contains normal follicular changes.  Right ovary measures 3.3 x 2.1 x 2.1 cm and contains normal follicular changes. No free pelvic fluid collections.  IMPRESSION: Somewhat small appearance to the gestational sac with fetal pole and yolk sac visualized.  However, no fetal motion or cardiac activity can be observed.  Crown-rump length is consistent with estimated gestational age [redacted] weeks 4 days.  This is somewhat small with respect to the maternal clinical dates.  Early intrauterine fetal demise should be  excluded.  Follow-up with serial quantitative beta HCG and/or short-term ultrasound in 2 weeks recommended as clinically indicated.  Results were discussed with Dr. Emelda Fear at the time of dictation, 0152 hours on 07/09/2011.  Original Report Authenticated By: Marlon Pel, M.D.     Assessment: Follow up s/p SAB  Plan: Sprintec and follow up with health department       [facility], NP 07/27/11 1319

## 2011-07-27 NOTE — Progress Notes (Signed)
Pt states here for f/u bhcg levels, last intercourse 3 days ago, noted spotting like cycle is starting. No pain at present.

## 2011-07-28 LAB — GC/CHLAMYDIA PROBE AMP, GENITAL
Chlamydia, DNA Probe: NEGATIVE
GC Probe Amp, Genital: NEGATIVE

## 2011-08-09 LAB — POCT RAPID STREP A: Streptococcus, Group A Screen (Direct): NEGATIVE

## 2011-08-15 LAB — DIFFERENTIAL
Basophils Absolute: 0
Basophils Relative: 0
Eosinophils Absolute: 0.2
Eosinophils Relative: 2
Lymphocytes Relative: 35
Lymphs Abs: 2.3
Monocytes Absolute: 0.3
Monocytes Relative: 5
Neutro Abs: 3.8
Neutrophils Relative %: 57

## 2011-08-15 LAB — CBC
HCT: 35.8 — ABNORMAL LOW
Hemoglobin: 12.6
MCHC: 35.1
MCV: 81.7
Platelets: 286
RBC: 4.39
RDW: 13.6
WBC: 6.6

## 2011-08-22 LAB — CBC
HCT: 24.4 — ABNORMAL LOW
HCT: 26.2 — ABNORMAL LOW
HCT: 36
Hemoglobin: 12.4
Hemoglobin: 8.4 — ABNORMAL LOW
Hemoglobin: 9 — ABNORMAL LOW
MCHC: 34.3
MCHC: 34.6
MCHC: 34.6
MCV: 89.1
MCV: 89.7
MCV: 89.8
Platelets: 150 — ABNORMAL LOW
Platelets: 158 — ABNORMAL LOW
Platelets: 158 — ABNORMAL LOW
RBC: 2.72 — ABNORMAL LOW
RBC: 2.91 — ABNORMAL LOW
RBC: 4.04
RDW: 14.9 — ABNORMAL HIGH
RDW: 15 — ABNORMAL HIGH
RDW: 15 — ABNORMAL HIGH
WBC: 12.3 — ABNORMAL HIGH
WBC: 7.4
WBC: 7.8

## 2011-08-22 LAB — RPR: RPR Ser Ql: NONREACTIVE

## 2011-08-22 LAB — CREATININE, SERUM: Creatinine, Ser: 0.74

## 2011-08-26 ENCOUNTER — Encounter (HOSPITAL_COMMUNITY): Payer: Self-pay | Admitting: *Deleted

## 2011-08-26 ENCOUNTER — Inpatient Hospital Stay (HOSPITAL_COMMUNITY)
Admission: AD | Admit: 2011-08-26 | Discharge: 2011-08-26 | Disposition: A | Discharge status: Home / Self Care | Payer: Medicaid Other | Source: Ambulatory Visit | Attending: Obstetrics & Gynecology | Admitting: Obstetrics & Gynecology

## 2011-08-26 DIAGNOSIS — N912 Amenorrhea, unspecified: Secondary | ICD-10-CM | POA: Insufficient documentation

## 2011-08-26 DIAGNOSIS — N898 Other specified noninflammatory disorders of vagina: Secondary | ICD-10-CM

## 2011-08-26 HISTORY — DX: Unspecified abnormal cytological findings in specimens from cervix uteri: R87.619

## 2011-08-26 HISTORY — DX: Reserved for concepts with insufficient information to code with codable children: IMO0002

## 2011-08-26 LAB — HCG, QUANTITATIVE, PREGNANCY: hCG, Beta Chain, Quant, S: <1 m[IU]/mL (ref <5)

## 2011-08-26 LAB — POCT PREGNANCY, URINE: Preg Test, Ur: NEGATIVE

## 2011-08-26 LAB — WET PREP, GENITAL
Clue Cells Wet Prep HPF POC: NONE SEEN
Trich, Wet Prep: NONE SEEN
WBC, Wet Prep HPF POC: NONE SEEN
Yeast Wet Prep HPF POC: NONE SEEN

## 2011-08-26 NOTE — ED Provider Notes (Signed)
History     CSN: [id number] Arrival date & time: 08/26/2011  9:03 AM   None     Chief Complaint  Patient presents with  . Amenorrhea   HPI Victoria Wright is a 20 y.o. female who presents to MAU for amenorrhea and vaginal discharge. Had a miscarriage in August and no period since that time. Denies nausea vomiting or any other problems. The history was provided by the patient.  Past Medical History  Diagnosis Date  . UTI (lower urinary tract infection)   . Abnormal Pap smear     follow up was wnl    Past Surgical History  Procedure Date  . Wisdom tooth extraction     Family History  Problem Relation Age of Onset  . Hypertension Mother   . Diabetes Maternal Grandmother   . Cancer Maternal Grandfather     History  Substance Use Topics  . Smoking status: Current Everyday Smoker -- 0.2 packs/day  . Smokeless tobacco: Never Used  . Alcohol Use: No    OB History    Grav Para Term Preterm Abortions TAB SAB Ect Mult Living   2 1 1  1  1   1       Review of Systems  Constitutional: Negative for fever, chills, diaphoresis and fatigue.  HENT: Negative for ear pain, congestion, sore throat, facial swelling, neck pain, neck stiffness, dental problem and sinus pressure.   Eyes: Negative for photophobia, pain and discharge.  Respiratory: Negative for cough, chest tightness and wheezing.   Cardiovascular: Negative.   Gastrointestinal: Positive for constipation. Negative for nausea, vomiting, abdominal pain, diarrhea and abdominal distention.  Genitourinary: Positive for frequency and vaginal discharge. Negative for dysuria, flank pain, vaginal bleeding and difficulty urinating.  Musculoskeletal: Negative for myalgias, back pain and gait problem.  Skin: Negative for color change and rash.  Neurological: Negative for dizziness, speech difficulty, weakness, light-headedness, numbness and headaches.  Psychiatric/Behavioral: Negative for confusion and agitation.    Allergies    Amoxicillin and Prednisone  Home Medications  No current outpatient prescriptions on file.  BP 108/61  Pulse 62  Temp(Src) 98 F (36.7 C) (Oral)  Resp 18  Ht 5' 5.5" (1.664 m)  Wt 170 lb (77.111 kg)  BMI 27.86 kg/m2  LMP 05/09/2011  Breastfeeding? Unknown  Physical Exam  Nursing note and vitals reviewed. Constitutional: She is oriented to person, place, and time. She appears well-developed and well-nourished.  HENT:  Head: Normocephalic and atraumatic.  Eyes: EOM are normal.  Neck: Normal range of motion. Neck supple.  Cardiovascular: Normal rate.   Pulmonary/Chest: Effort normal.  Abdominal: Soft. There is no tenderness.  Genitourinary:       Thick white vaginal discharge. Cervix closed, no CMT, no adnexal tenderness, uterus without palpable enlargement.  Musculoskeletal: Normal range of motion.  Neurological: She is alert and oriented to person, place, and time. No cranial nerve deficit.  Skin: Skin is warm and dry.   Wet prep negative, urine normal, pregnancy negative.  Assessment: Amenorrhea  Plan:  Discussed with patient reasons for amenorrhea and need for follow up with GYN   ED Course  Procedures Patient request Bhcg because when she had her miscarriage in August she had a negative urine pregnancy test but a + Bhcg.  Bhcg.<2.  MDM          Kerrie Buffalo, NP 08/29/11 [phone]

## 2011-08-26 NOTE — Progress Notes (Signed)
No period since miscarriage in Aug.  Discharge has been really thick.

## 2011-08-26 NOTE — ED Notes (Signed)
Pt called with HCG result.  preg is completely resolved.  Pt verbalised understanding

## 2011-08-27 LAB — GC/CHLAMYDIA PROBE AMP, GENITAL
Chlamydia, DNA Probe: NEGATIVE
GC Probe Amp, Genital: NEGATIVE

## 2011-08-29 NOTE — ED Provider Notes (Signed)
Agree with above note.  Barnabas Henriques H. 08/29/2011 2:39 PM

## 2011-09-12 NOTE — ED Provider Notes (Signed)
Agree with above note.  Orby Tangen 09/12/2011 11:05 AM   

## 2011-11-15 ENCOUNTER — Encounter (HOSPITAL_COMMUNITY): Payer: Self-pay | Admitting: *Deleted

## 2011-11-15 ENCOUNTER — Emergency Department (INDEPENDENT_AMBULATORY_CARE_PROVIDER_SITE_OTHER)
Admission: EM | Admit: 2011-11-15 | Discharge: 2011-11-15 | Disposition: A | Discharge status: Home / Self Care | Payer: Self-pay | Source: Home / Self Care | Attending: Family Medicine | Admitting: Family Medicine

## 2011-11-15 DIAGNOSIS — S46819A Strain of other muscles, fascia and tendons at shoulder and upper arm level, unspecified arm, initial encounter: Secondary | ICD-10-CM

## 2011-11-15 DIAGNOSIS — S43499A Other sprain of unspecified shoulder joint, initial encounter: Secondary | ICD-10-CM

## 2011-11-15 DIAGNOSIS — S46219A Strain of muscle, fascia and tendon of other parts of biceps, unspecified arm, initial encounter: Secondary | ICD-10-CM
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
## 2011-12-08 ENCOUNTER — Emergency Department (INDEPENDENT_AMBULATORY_CARE_PROVIDER_SITE_OTHER)
Admission: EM | Admit: 2011-12-08 | Discharge: 2011-12-08 | Disposition: A | Discharge status: Home / Self Care | Payer: Medicaid Other | Source: Home / Self Care | Attending: Emergency Medicine | Admitting: Emergency Medicine

## 2011-12-08 ENCOUNTER — Encounter (HOSPITAL_COMMUNITY): Payer: Self-pay | Admitting: Emergency Medicine

## 2011-12-08 DIAGNOSIS — L239 Allergic contact dermatitis, unspecified cause: Secondary | ICD-10-CM

## 2011-12-08 DIAGNOSIS — L259 Unspecified contact dermatitis, unspecified cause: Secondary | ICD-10-CM

## 2011-12-08 HISTORY — DX: Acne, unspecified: L70.9

## 2011-12-08 MED ORDER — LORATADINE 10 MG PO TABS: 10.0000 mg | ORAL_TABLET | Freq: Every day | ORAL | Status: DC

## 2011-12-08 MED ORDER — TRIAMCINOLONE ACETONIDE 0.1 % EX CREA: TOPICAL_CREAM | Freq: Two times a day (BID) | CUTANEOUS | Status: DC

## 2011-12-08 NOTE — ED Notes (Signed)
Rash under eye from pro-active medicine

## 2011-12-08 NOTE — ED Provider Notes (Signed)
History     CSN: [id number]  Arrival date & time 12/08/11  1918   First MD Initiated Contact with Patient 12/08/11 2041      Chief Complaint  Patient presents with  . Rash    (Consider location/radiation/quality/duration/timing/severity/associated sxs/prior treatment) HPI Comments: Patient with itchy erythematous areas around her eyes starting today after using a new over-the-counter acne medication. States it "burns" and reports intermittent swelling. Tried an unknown dose of liquid Benadryl without relief. No visual changes, angioedema, tongue swelling, difficulty breathing, wheezing, cough, shortness of breath. No rash anywhere else. Similar reaction before, and was sent home with a topical steroid, which resolved her symptoms.  ROS as noted in HPI. All other ROS negative.   Patient is a 20 y.o. female presenting with rash. The history is provided by the patient. No language interpreter was used.  Rash  This is a new problem. The current episode started 6 to 12 hours ago. Associated with: new acne cream. There has been no fever. The rash is present on the face. She has tried antihistamines for the symptoms. The treatment provided no relief.    Past Medical History  Diagnosis Date  . UTI (lower urinary tract infection)   . Abnormal Pap smear     follow up was wnl  . Acne     Past Surgical History  Procedure Date  . Wisdom tooth extraction     Family History  Problem Relation Age of Onset  . Hypertension Mother   . Diabetes Maternal Grandmother   . Cancer Maternal Grandfather     History  Substance Use Topics  . Smoking status: Current Everyday Smoker -- 0.5 packs/day  . Smokeless tobacco: Never Used  . Alcohol Use: No    OB History    Grav Para Term Preterm Abortions TAB SAB Ect Mult Living   2 1 1  1  1   1       Review of Systems  Skin: Positive for rash.    Allergies  Amoxicillin and Prednisone  Home Medications   Current Outpatient Rx  Name  Route Sig Dispense Refill  . LORATADINE 10 MG PO TABS Oral Take 1 tablet (10 mg total) by mouth daily. 10 tablet 0  . TRIAMCINOLONE ACETONIDE 0.1 % EX CREA Topical Apply topically 2 (two) times daily. Apply for 2 weeks. May use on face 30 g 0    BP 120/63  Pulse 86  Temp(Src) 98.5 F (36.9 C) (Oral)  Resp 14  SpO2 100%  LMP 12/08/2011  Physical Exam  Nursing note and vitals reviewed. Constitutional: She is oriented to person, place, and time. She appears well-developed and well-nourished. No distress.  HENT:  Head: Normocephalic and atraumatic.    Mouth/Throat: Uvula is midline, oropharynx is clear and moist and mucous membranes are normal.       Erythematous, blanchable areas. Skin intact. No significant periorbital swelling.   Eyes: Conjunctivae, EOM and lids are normal. Pupils are equal, round, and reactive to light.  Neck: Normal range of motion.  Cardiovascular: Regular rhythm.   Pulmonary/Chest: Effort normal.  Abdominal: She exhibits no distension.  Musculoskeletal: Normal range of motion.  Neurological: She is alert and oriented to person, place, and time.  Skin: Skin is warm and dry.  Psychiatric: She has a normal mood and affect. Her behavior is normal. Judgment and thought content normal.    ED Course  Procedures (including critical care time)  Labs Reviewed - No data to display No results  found.   1. Contact allergic reaction      MDM    Luiz Blare, MD 12/08/11 2107

## 2011-12-25 ENCOUNTER — Emergency Department (HOSPITAL_COMMUNITY)
Admission: EM | Admit: 2011-12-25 | Discharge: 2011-12-25 | Disposition: A | Discharge status: Home / Self Care | Payer: Medicaid Other | Source: Home / Self Care

## 2012-01-01 ENCOUNTER — Encounter (HOSPITAL_COMMUNITY): Payer: Self-pay

## 2012-01-01 ENCOUNTER — Inpatient Hospital Stay (HOSPITAL_COMMUNITY)
Admission: AD | Admit: 2012-01-01 | Discharge: 2012-01-01 | Disposition: A | Discharge status: Home / Self Care | Payer: Medicaid Other | Source: Ambulatory Visit | Attending: Obstetrics & Gynecology | Admitting: Obstetrics & Gynecology

## 2012-01-01 DIAGNOSIS — L989 Disorder of the skin and subcutaneous tissue, unspecified: Secondary | ICD-10-CM

## 2012-01-01 DIAGNOSIS — Z3202 Encounter for pregnancy test, result negative: Secondary | ICD-10-CM

## 2012-01-01 LAB — POCT PREGNANCY, URINE: Preg Test, Ur: NEGATIVE

## 2012-01-01 NOTE — ED Notes (Signed)
Patient left room without RN knowledge.

## 2012-01-01 NOTE — ED Notes (Signed)
Patient has area on right side of her neck that looks excoriated states started out as two bumps she scratched it now is spreading, no drainage noted from area.  Patient took an early pregnancy test that was positive however has not missed a period yet scheduled to start 4 days from now, having increased vaginal discharge, no itching or burning.

## 2012-01-01 NOTE — Discharge Instructions (Signed)
Pregnancy Tests HOW DO PREGNANCY TESTS WORK? All pregnancy tests look for a special hormone in the urine or blood that is only present in pregnant women. This hormone, human chorionic gonadotropin (hCG), is also called the pregnancy hormone.  WHAT IS THE DIFFERENCE BETWEEN A URINE AND A BLOOD PREGNANCY TEST? IS ONE BETTER THAN THE OTHER? There are two types of pregnancy tests.  Blood tests.   Urine tests.  Both tests look for the presence of hCG, the pregnancy hormone. Many women use a urine test or home pregnancy test (HPT) to find out if they are pregnant. HPTs are cheap, easy to use, can be done at home, and are private. When a woman has a positive result on an HPT, she needs to see her caregiver right away. The caregiver can confirm a positive HPT result with another urine test, a blood test, ultrasound, and a pelvic exam.  There are two types of blood tests you can get from a caregiver.   A quantitative blood test (or the beta hCG test). This test measures the exact amount of hCG in the blood. This means it can pick up very small amounts of hCG, making it a very accurate test.   A qualitative hCG blood test. This test gives a simple yes or no answer to whether you are pregnant. This test is more like a urine test in terms of its accuracy.  Blood tests can pick up hCG earlier in a pregnancy than urine tests can. Blood tests can tell if you are pregnant about 6 to 8 days after you release an egg from an ovary (ovulate). Urine tests can determine pregnancy about 2 weeks after ovulation. Some more sensitive urine tests can tell if you are pregnant as early as 6 days or even 1 day after you miss a menstrual period.  HOW IS A HOME PREGNANCY TEST DONE?  There are many types of home pregnancy tests or HPTs that can be bought over-the-counter at drug or discount stores.   Some involve collecting your urine in a cup and dipping a stick into the urine or putting some of the urine into a special  container with an eyedropper.   Others are done by placing a stick into your urine stream.   Tests vary in how long you need to wait for the stick or container to turn a certain color or have a symbol on it (like a plus or a minus).   All tests come with written instructions. Most tests also have toll-free phone numbers to call if you have any questions about how to do the test or read the results.  HOW ACCURATE ARE HOME PREGNANCY TESTS?  HPTs are very accurate. Most brands of HPTs say they are 97% to 99% accurate when taken 1 week after missing your menstrual period, but this can vary with actual use. Each brand varies in how sensitive it is in picking up the pregnancy hormone hCG. If a test is not done correctly, it will be less accurate. Always check the package to make sure it is not past its expiration date. If it is, it will not be accurate. Most brands of HPTs tell users to do the test again in a few days, no matter what the results.  If you use an HPT too early in your pregnancy, you may not have enough of the pregnancy hormone hCG in your urine to have a positive test result. Most HPTs will be accurate if you test yourself around   the time your period is due (about 2 weeks after you ovulate). You can get a negative test result if you are not pregnant or if you ovulated later than you thought you did. You may also have problems with the pregnancy, which affects the amount of hCG you have in your urine. If your HPT is negative, test yourself again within a few days to 1 week. If you keep getting a negative result and think you are pregnant, talk with your caregiver right away about getting a blood pregnancy test.  FALSE POSITIVE PREGNANCY TEST A false positive HPT can happen if there is blood or protein present in your urine. A false positive can also happen if you were recently pregnant or if you take a pregnancy test too soon after taking fertility drug that contains hCG. Also, some prescription  medicines such as water pills (diuretics), tranquilizers, seizure medicines, psychiatric medicines, and allergy and nausea medicines (promethazine) give false positive readings. FALSE NEGATIVE PREGNANCY TEST  A false negative HPT can happen if you do the test too early. Try to wait until you are at least 1 day late for your menstrual period.   It may happen if you wait too long to test the urine (longer than 15 minutes).   It may also happen if the urine is too diluted because you drank a lot of fluids before getting the urine sample. It is best to test the first morning urine after you get out of bed.  If your menstrual period did not start after a week of a negative HPT, repeat the pregnancy test. CAN ANYTHING INTERFERE WITH HOME PREGNANCY TEST RESULTS?  Most medicines, both over-the-counter and prescription drugs, including birth control pills and antibiotics, should not affect the results of a HPT. Only those drugs that have the pregnancy hormone hCG in them can give a false positive test result. Drugs that have hCG in them may be used for treating infertility (not being able to get pregnant). Alcohol and illegal drugs do not affect HPT results, but you should not be using these substances if you are trying to get pregnant. If you have a positive pregnancy test, call your caregiver to make an appointment to begin prenatal care. Document Released: 10/27/2003 Document Revised: 07/06/2011 Document Reviewed: 01/07/2011 [facility] Patient Information 2012 [place], [place].

## 2012-01-01 NOTE — Progress Notes (Signed)
Pt reports having a positive pregnancy at home.  Reports having headaches  And nausea today. Reports having a bite on her neck 5 days ago that looks red and infected.

## 2012-01-01 NOTE — ED Provider Notes (Signed)
History     CSN: [id number]  Arrival date & time 01/01/12  1416   None     No chief complaint on file.   (Consider location/radiation/quality/duration/timing/severity/associated sxs/prior treatment) HPI Victoria Wright is a 21 y.o. G2P1011. Her LMPO was 1/28, + UPT at home yesterday. Pt c/o 2 bumps/bites she had on R neck that she scratehed and are open now. No vaginal bleeding.spotting or cramping. No other c/o to me today. UPT here was negative,can we do a blood test?  Past Medical History  Diagnosis Date  . UTI (lower urinary tract infection)   . Abnormal Pap smear     follow up was wnl  . Acne     Past Surgical History  Procedure Date  . Wisdom tooth extraction     Family History  Problem Relation Age of Onset  . Hypertension Mother   . Diabetes Maternal Grandmother   . Cancer Maternal Grandfather     History  Substance Use Topics  . Smoking status: Current Everyday Smoker -- 0.5 packs/day  . Smokeless tobacco: Never Used  . Alcohol Use: No    OB History    Grav Para Term Preterm Abortions TAB SAB Ect Mult Living   2 1 1  1  1   1       Review of Systems  Constitutional: Negative for fever and chills.  Genitourinary: Negative for vaginal bleeding and pelvic pain.    Allergies  Amoxicillin and Prednisone  Home Medications  No current outpatient prescriptions on file.  BP 127/71  Pulse 95  Temp(Src) 99.6 F (37.6 C) (Oral)  Resp 18  Ht 5\' 5"  (1.651 m)  Wt 174 lb 9.6 oz (79.198 kg)  BMI 29.05 kg/m2  LMP 12/05/2011  Physical Exam  Constitutional: She appears well-developed and well-nourished. No distress.  Skin: Skin is warm and dry.          2 healing open aresa R anterior neck,scattered scabs around them. No open oozing.    ED Course  Procedures (including critical care time)   Labs Reviewed  POCT PREGNANCY, URINE   No results found. Results for orders placed during the hospital encounter of 01/01/12 (from the past 24 hour(s))    POCT PREGNANCY, URINE     Status: Normal   Collection Time   01/01/12  3:19 PM      Component Value Range   Preg Test, Ur NEGATIVE  NEGATIVE      No diagnosis found. ASSESSMENT:  Not pregnant Healing lesions on neck  PLAN:  Repeat UPT if misses period. If pos call [facility] to start [facility]. Return here with pain or bleeding. Topical antibiotic oint ,warm compresses if desires to neck. If continue to see dermatologist.    MDM          Victoria Wright. Victoria Pea, NP 01/01/12 1555

## 2012-02-11 ENCOUNTER — Encounter (HOSPITAL_COMMUNITY): Payer: Self-pay | Admitting: *Deleted

## 2012-02-11 ENCOUNTER — Inpatient Hospital Stay (HOSPITAL_COMMUNITY)
Admission: AD | Admit: 2012-02-11 | Discharge: 2012-02-12 | Disposition: A | Discharge status: Home / Self Care | Payer: Medicaid Other | Source: Ambulatory Visit | Attending: Obstetrics & Gynecology | Admitting: Obstetrics & Gynecology

## 2012-02-11 DIAGNOSIS — N938 Other specified abnormal uterine and vaginal bleeding: Secondary | ICD-10-CM | POA: Insufficient documentation

## 2012-02-11 DIAGNOSIS — N926 Irregular menstruation, unspecified: Secondary | ICD-10-CM

## 2012-02-11 DIAGNOSIS — N939 Abnormal uterine and vaginal bleeding, unspecified: Secondary | ICD-10-CM

## 2012-02-11 DIAGNOSIS — N949 Unspecified condition associated with female genital organs and menstrual cycle: Secondary | ICD-10-CM | POA: Insufficient documentation

## 2012-02-11 DIAGNOSIS — R51 Headache: Secondary | ICD-10-CM | POA: Insufficient documentation

## 2012-02-11 DIAGNOSIS — R42 Dizziness and giddiness: Secondary | ICD-10-CM | POA: Insufficient documentation

## 2012-02-11 LAB — POCT PREGNANCY, URINE: Preg Test, Ur: NEGATIVE

## 2012-02-11 NOTE — MAU Note (Signed)
Irreg vag bleeding for the last month. Also having headaches and constipation the last month. Lightheaded and dizzy

## 2012-02-12 LAB — URINALYSIS, ROUTINE W REFLEX MICROSCOPIC
Bilirubin Urine: NEGATIVE
Glucose, UA: NEGATIVE mg/dL
Ketones, ur: NEGATIVE mg/dL
Leukocytes, UA: NEGATIVE
Nitrite: NEGATIVE
Protein, ur: NEGATIVE mg/dL
Specific Gravity, Urine: 1.02 (ref 1.005–1.030)
Urobilinogen, UA: 0.2 mg/dL (ref 0.0–1.0)
pH: 7.5 (ref 5.0–8.0)

## 2012-02-12 LAB — CBC
HCT: 35.7 % — ABNORMAL LOW (ref 36.0–46.0)
Hemoglobin: 12.3 g/dL (ref 12.0–15.0)
MCH: 30.8 pg (ref 26.0–34.0)
MCHC: 34.5 g/dL (ref 30.0–36.0)
MCV: 89.3 fL (ref 78.0–100.0)
Platelets: 226 10*3/uL (ref 150–400)
RBC: 4 MIL/uL (ref 3.87–5.11)
RDW: 12.4 % (ref 11.5–15.5)
WBC: 4.5 10*3/uL (ref 4.0–10.5)

## 2012-02-12 LAB — URINE MICROSCOPIC-ADD ON: WBC, UA: NONE SEEN WBC/hpf (ref <3)

## 2012-02-12 LAB — WET PREP, GENITAL
Clue Cells Wet Prep HPF POC: NONE SEEN
Trich, Wet Prep: NONE SEEN
WBC, Wet Prep HPF POC: NONE SEEN
Yeast Wet Prep HPF POC: NONE SEEN

## 2012-02-12 NOTE — Progress Notes (Signed)
S. Shore CNM in to see pt 

## 2012-02-12 NOTE — MAU Provider Note (Signed)
Attestation of Attending Supervision of Advanced Practitioner: Evaluation and management procedures were performed by the OB Fellow/PA/CNM/NP under my supervision and collaboration. Chart reviewed, and agree with management and plan.  Yashica Sterbenz, M.D. 02/12/2012 8:25 AM   

## 2012-02-12 NOTE — Discharge Instructions (Signed)
Abnormal Uterine Bleeding Abnormal uterine bleeding can have many causes. Some cases are simply treated, while others are more serious. There are several kinds of bleeding that is considered abnormal, including:  Bleeding between periods.   Bleeding after sexual intercourse.   Spotting anytime in the menstrual cycle.   Bleeding heavier or more than normal.   Bleeding after menopause.  CAUSES  There are many causes of abnormal uterine bleeding. It can be present in teenagers, pregnant women, women during their reproductive years, and women who have reached menopause. Your caregiver will look for the more common causes depending on your age, signs, symptoms and your particular circumstance. Most cases are not serious and can be treated. Even the more serious causes, like cancer of the female organs, can be treated adequately if found in the early stages. That is why all types of bleeding should be evaluated and treated as soon as possible. DIAGNOSIS  Diagnosing the cause may take several kinds of tests. Your caregiver may:  Take a complete history of the type of bleeding.   Perform a complete physical exam and Pap smear.   Take an ultrasound on the abdomen showing a picture of the female organs and the pelvis.   Inject dye into the uterus and Fallopian tubes and X-ray them (hysterosalpingogram).   Place fluid in the uterus and do an ultrasound (sonohysterogrqphy).   Take a CT scan to examine the female organs and pelvis.   Take an MRI to examine the female organs and pelvis. There is no X-ray involved with this procedure.   Look inside the uterus with a telescope that has a light at the end (hysteroscopy).   Scrap the inside of the uterus to get tissue to examine (Dilatation and Curettage, D&C).   Look into the pelvis with a telescope that has a light at the end (laparoscopy). This is done through a very small cut (incision) in the abdomen.  TREATMENT  Treatment will depend on the  cause of the abnormal bleeding. It can include:  Doing nothing to allow the problem to take care of itself over time.   Hormone treatment.   Birth control pills.   Treating the medical condition causing the problem.   Laparoscopy.   Major or minor surgery   Destroying the lining of the uterus with electrical currant, laser, freezing or heat (uterine ablation).  HOME CARE INSTRUCTIONS   Follow your caregiver's recommendation on how to treat your problem.   See your caregiver if you missed a menstrual period and think you may be pregnant.   If you are bleeding heavily, count the number of pads/tampons you use and how often you have to change them. Tell this to your caregiver.   Avoid sexual intercourse until the problem is controlled.  SEEK MEDICAL CARE IF:   You have any kind of abnormal bleeding mentioned above.   You feel dizzy at times.   You are 21 years old and have not had a menstrual period yet.  SEEK IMMEDIATE MEDICAL CARE IF:   You pass out.   You are changing pads/tampons every 15 to 30 minutes.   You have belly (abdominal) pain.   You have a temperature of 100 F (37.8 C) or higher.   You become sweaty or weak.   You are passing large blood clots from the vagina.   You start to feel sick to your stomach (nauseous) and throw up (vomit).  Document Released: 10/24/2005 Document Revised: 10/13/2011 Document Reviewed: 03/19/2009 [employer]   Patient Information 2012 [employer]. 

## 2012-02-12 NOTE — MAU Provider Note (Signed)
Chief Complaint:  Headache, Vaginal Bleeding and Dizziness    First Provider Initiated Contact with Patient 02/12/12 0018      Victoria Wright is  21 y.o. G2P1011.  Patient's last menstrual period was 01/17/2012.Marland Kitchen  Her pregnancy status is negative.  She presents complaining of Headache, Vaginal Bleeding and Dizziness  Reports 3 periods in the last month. All with moderate bleeding lasting 4 days. Same partner x 1 year. No hx of STI. No contraception currently. Reports regular monthly periods until February of this year.   Obstetrical/Gynecological History: OB History    Grav Para Term Preterm Abortions TAB SAB Ect Mult Living   2 1 1  1  1   1       Past Medical History: Past Medical History  Diagnosis Date  . UTI (lower urinary tract infection)   . Abnormal Pap smear     follow up was wnl  . Acne     Past Surgical History: Past Surgical History  Procedure Date  . Wisdom tooth extraction     Family History: Family History  Problem Relation Age of Onset  . Hypertension Mother   . Diabetes Maternal Grandmother   . Cancer Maternal Grandfather   . Anesthesia problems Neg Hx   . Hypotension Neg Hx   . Pseudochol deficiency Neg Hx   . Malignant hyperthermia Neg Hx     Social History: History  Substance Use Topics  . Smoking status: Current Everyday Smoker -- 0.5 packs/day  . Smokeless tobacco: Never Used  . Alcohol Use: Yes     occasional    Allergies:  Allergies  Allergen Reactions  . Amoxicillin Rash  . Prednisone Rash    Prescriptions prior to admission  Medication Sig Dispense Refill  . ibuprofen (ADVIL,MOTRIN) 200 MG tablet Take 400 mg by mouth as needed. Used for headache.        Review of Systems - Negative except what has been reviewed in the HPI  Physical Exam   Blood pressure 127/73, pulse 91, temperature 98.7 F (37.1 C), temperature source Oral, resp. rate 20, last menstrual period 01/17/2012.  General: General appearance - alert, well  appearing, and in no distress, oriented to person, place, and time and overweight Mental status - alert, oriented to person, place, and time, normal mood, behavior, speech, dress, motor activity, and thought processes, affect appropriate to mood Abdomen - soft, nontender, nondistended, no masses or organomegaly Focused Gynecological Exam: VULVA: normal appearing vulva with no masses, tenderness or lesions, VAGINA: vaginal discharge - small menstrual blood noted, CERVIX: normal appearing cervix without discharge or lesions, UTERUS: uterus is normal size, shape, consistency and nontender, ADNEXA: normal adnexa in size, nontender and no masses  Labs: Recent Results (from the past 24 hour(s))  URINALYSIS, ROUTINE W REFLEX MICROSCOPIC   Collection Time   02/11/12 11:20 PM      Component Value Range   Color, Urine YELLOW  YELLOW    APPearance HAZY (*) CLEAR    Specific Gravity, Urine 1.020  1.005 - 1.030    pH 7.5  5.0 - 8.0    Glucose, UA NEGATIVE  NEGATIVE (mg/dL)   Hgb urine dipstick MODERATE (*) NEGATIVE    Bilirubin Urine NEGATIVE  NEGATIVE    Ketones, ur NEGATIVE  NEGATIVE (mg/dL)   Protein, ur NEGATIVE  NEGATIVE (mg/dL)   Urobilinogen, UA 0.2  0.0 - 1.0 (mg/dL)   Nitrite NEGATIVE  NEGATIVE    Leukocytes, UA NEGATIVE  NEGATIVE   URINE MICROSCOPIC-ADD  ON   Collection Time   02/11/12 11:20 PM      Component Value Range   Squamous Epithelial / LPF RARE  RARE    WBC, UA    <3 (WBC/hpf)   Value: NO FORMED ELEMENTS SEEN ON URINE MICROSCOPIC EXAMINATION   RBC / HPF 0-2  <3 (RBC/hpf)   Bacteria, UA FEW (*) RARE    Urine-Other MUCOUS PRESENT    POCT PREGNANCY, URINE   Collection Time   02/11/12 11:53 PM      Component Value Range   Preg Test, Ur NEGATIVE  NEGATIVE   WET PREP, GENITAL   Collection Time   02/12/12 12:15 AM      Component Value Range   Yeast Wet Prep HPF POC NONE SEEN  NONE SEEN    Trich, Wet Prep NONE SEEN  NONE SEEN    Clue Cells Wet Prep HPF POC NONE SEEN  NONE SEEN     WBC, Wet Prep HPF POC NONE SEEN  NONE SEEN   CBC   Collection Time   02/12/12 12:23 AM      Component Value Range   WBC 4.5  4.0 - 10.5 (K/uL)   RBC 4.00  3.87 - 5.11 (MIL/uL)   Hemoglobin 12.3  12.0 - 15.0 (g/dL)   HCT 16.1 (*) 09.6 - 46.0 (%)   MCV 89.3  78.0 - 100.0 (fL)   MCH 30.8  26.0 - 34.0 (pg)   MCHC 34.5  30.0 - 36.0 (g/dL)   RDW 04.5  40.9 - 81.1 (%)   Platelets 226  150 - 400 (K/uL)   Assessment: DUB  Plan: Discharge home FU in [facility], clinic staff will call with date/time of your appt.  Denelle Capurro E. 02/12/2012,1:10 AM

## 2012-02-12 NOTE — Progress Notes (Signed)
Written and verbal d/c instructions given and understanding voiced. 

## 2012-02-13 LAB — GC/CHLAMYDIA PROBE AMP, GENITAL
Chlamydia, DNA Probe: NEGATIVE
GC Probe Amp, Genital: UNDETERMINED

## 2012-02-28 ENCOUNTER — Inpatient Hospital Stay (HOSPITAL_COMMUNITY)
Admission: AD | Admit: 2012-02-28 | Discharge: 2012-02-28 | Disposition: A | Discharge status: Home / Self Care | Payer: Medicaid Other | Source: Ambulatory Visit | Attending: Obstetrics & Gynecology | Admitting: Obstetrics & Gynecology

## 2012-02-28 ENCOUNTER — Encounter (HOSPITAL_COMMUNITY): Payer: Self-pay | Admitting: *Deleted

## 2012-02-28 DIAGNOSIS — N926 Irregular menstruation, unspecified: Secondary | ICD-10-CM | POA: Insufficient documentation

## 2012-02-28 DIAGNOSIS — R350 Frequency of micturition: Secondary | ICD-10-CM | POA: Insufficient documentation

## 2012-02-28 DIAGNOSIS — N3090 Cystitis, unspecified without hematuria: Secondary | ICD-10-CM

## 2012-02-28 LAB — URINE MICROSCOPIC-ADD ON

## 2012-02-28 LAB — URINALYSIS, ROUTINE W REFLEX MICROSCOPIC
Bilirubin Urine: NEGATIVE
Glucose, UA: NEGATIVE mg/dL
Ketones, ur: NEGATIVE mg/dL
Nitrite: NEGATIVE
Protein, ur: NEGATIVE mg/dL
Specific Gravity, Urine: 1.015 (ref 1.005–1.030)
Urobilinogen, UA: 0.2 mg/dL (ref 0.0–1.0)
pH: 7 (ref 5.0–8.0)

## 2012-02-28 LAB — POCT PREGNANCY, URINE: Preg Test, Ur: NEGATIVE

## 2012-02-28 MED ORDER — CIPROFLOXACIN HCL 500 MG PO TABS: 500.0000 mg | ORAL_TABLET | Freq: Two times a day (BID) | ORAL | Status: AC

## 2012-02-28 NOTE — MAU Provider Note (Signed)
Victoria Kendrick Rudd20 [id number]  Chief Complaint  Patient presents with  . Urinary Frequency     None     SUBJECTIVE  HPI: Urinary frequency and sensation of pressure with the end of urination. She denies new sex partner and has had same partner for one year. She was seen here on 02/11/2012  For DUB having reported 3 menstrual periods in a month ( she attributes it to using tampons for the first time in years) and she is set up to be followed at the GYN clinic. She had GC Chlamydia done at that visit and the GC was reported as indeterminate Chlamydia negative. Denies irritative, discolored or malodorous vaginal discharge.  Past Medical History  Diagnosis Date  . UTI (lower urinary tract infection)   . Abnormal Pap smear     follow up was wnl  . Acne    Past Surgical History  Procedure Date  . Wisdom tooth extraction    History   Social History  . Marital Status: Single    Spouse Name: N/A    Number of Children: N/A  . Years of Education: N/A   Occupational History  . Not on file.   Social History Main Topics  . Smoking status: Current Everyday Smoker -- 0.5 packs/day  . Smokeless tobacco: Never Used  . Alcohol Use: Yes     occasional  . Drug Use: No  . Sexually Active: Yes    Birth Control/ Protection: None   Other Topics Concern  . Not on file   Social History Narrative  . No narrative on file   No current facility-administered medications on file prior to encounter.   Current Outpatient Prescriptions on File Prior to Encounter  Medication Sig Dispense Refill  . ibuprofen (ADVIL,MOTRIN) 200 MG tablet Take 400 mg by mouth as needed. Used for headache.       Allergies  Allergen Reactions  . Amoxicillin Rash  . Prednisone Rash    ROS: Pertinent items in HPI  OBJECTIVE Blood pressure 130/67, pulse 79, temperature 99.3 F (37.4 C), temperature source Oral, resp. rate 16, height 5\' 5"  (1.651 m), weight 82.645 kg (182 lb 3.2 oz), last menstrual period  01/17/2012, SpO2 100.00%. GENERAL: Well-developed, well-nourished female in no acute distress.  ABDOMEN: Soft, nontender EXTREMITIES: Nontender, no edema SPECULUM EXAM: NEFG, physiologic discharge, no blood noted, cervix clean BIMANUAL: cervix no CMT; uterus NSSP; no adnexal tenderness or masses   LAB RESULTS Results for orders placed during the hospital encounter of 02/28/12 (from the past 24 hour(s))  URINALYSIS, ROUTINE W REFLEX MICROSCOPIC     Status: Abnormal   Collection Time   02/28/12 12:10 PM      Component Value Range   Color, Urine YELLOW  YELLOW    APPearance HAZY (*) CLEAR    Specific Gravity, Urine 1.015  1.005 - 1.030    pH 7.0  5.0 - 8.0    Glucose, UA NEGATIVE  NEGATIVE (mg/dL)   Hgb urine dipstick TRACE (*) NEGATIVE    Bilirubin Urine NEGATIVE  NEGATIVE    Ketones, ur NEGATIVE  NEGATIVE (mg/dL)   Protein, ur NEGATIVE  NEGATIVE (mg/dL)   Urobilinogen, UA 0.2  0.0 - 1.0 (mg/dL)   Nitrite NEGATIVE  NEGATIVE    Leukocytes, UA SMALL (*) NEGATIVE   URINE MICROSCOPIC-ADD ON     Status: Abnormal   Collection Time   02/28/12 12:10 PM      Component Value Range   Squamous Epithelial / LPF RARE  RARE    WBC, UA 11-20  <3 (WBC/hpf)   Bacteria, UA FEW (*) RARE    Urine-Other MUCOUS PRESENT    POCT PREGNANCY, URINE     Status: Normal   Collection Time   02/28/12 12:16 PM      Component Value Range   Preg Test, Ur NEGATIVE  NEGATIVE        ASSESSMENT Cystitis Indeterminate result on prior GC culture Abnormal bleeding pattern  PLAN GC/CT sent Rx Cipro F/U Gyn Clinic 03/12/2012     Victoria Wright 02/28/2012 2:13 PM

## 2012-02-28 NOTE — MAU Note (Signed)
Patient states she has had irregular bleeding for about one month with three episodes of bleeding. Had regular periods before that time. States urinary frequency and pressure with urination for about 3-4 days.

## 2012-02-28 NOTE — Discharge Instructions (Signed)

## 2012-02-29 LAB — GC/CHLAMYDIA PROBE AMP, GENITAL
Chlamydia, DNA Probe: NEGATIVE
GC Probe Amp, Genital: NEGATIVE

## 2012-03-12 ENCOUNTER — Encounter: Payer: Medicaid Other | Admitting: Advanced Practice Midwife

## 2012-04-02 ENCOUNTER — Inpatient Hospital Stay (HOSPITAL_COMMUNITY)
Admission: AD | Admit: 2012-04-02 | Discharge: 2012-04-02 | Disposition: A | Discharge status: Home / Self Care | Payer: Medicaid Other | Source: Ambulatory Visit | Attending: Obstetrics & Gynecology | Admitting: Obstetrics & Gynecology

## 2012-04-02 DIAGNOSIS — R300 Dysuria: Secondary | ICD-10-CM | POA: Insufficient documentation

## 2012-04-02 DIAGNOSIS — R350 Frequency of micturition: Secondary | ICD-10-CM | POA: Insufficient documentation

## 2012-04-02 LAB — URINALYSIS, ROUTINE W REFLEX MICROSCOPIC
Bilirubin Urine: NEGATIVE
Glucose, UA: NEGATIVE mg/dL
Hgb urine dipstick: NEGATIVE
Ketones, ur: NEGATIVE mg/dL
Nitrite: NEGATIVE
Protein, ur: NEGATIVE mg/dL
Specific Gravity, Urine: 1.025 (ref 1.005–1.030)
Urobilinogen, UA: 0.2 mg/dL (ref 0.0–1.0)
pH: 6 (ref 5.0–8.0)

## 2012-04-02 LAB — URINE MICROSCOPIC-ADD ON

## 2012-04-02 LAB — POCT PREGNANCY, URINE: Preg Test, Ur: NEGATIVE

## 2012-04-02 MED ORDER — CIPROFLOXACIN HCL 250 MG PO TABS: 250.0000 mg | ORAL_TABLET | Freq: Two times a day (BID) | ORAL | Status: AC

## 2012-04-02 NOTE — MAU Provider Note (Signed)
History     CSN: [id number]  Arrival date and time: 04/02/12 1332   First Provider Initiated Contact with Patient 04/02/12 1355      Chief Complaint  Patient presents with  . Urinary Frequency   HPI Victoria Wright is 21 y.o. [id number] Unknown weeks presenting with UTI sxs--dysuria, pressure with urinating, cloudy urine, frequency.   States she is sure this is an UTI.  Does not need testing for STI's.  Uses condom for contraception.  Has been drinking a lot of water.   Past Medical History  Diagnosis Date  . UTI (lower urinary tract infection)   . Abnormal Pap smear     follow up was wnl  . Acne     Past Surgical History  Procedure Date  . Wisdom tooth extraction     Family History  Problem Relation Age of Onset  . Hypertension Mother   . Diabetes Maternal Grandmother   . Cancer Maternal Grandfather   . Anesthesia problems Neg Hx   . Hypotension Neg Hx   . Pseudochol deficiency Neg Hx   . Malignant hyperthermia Neg Hx     History  Substance Use Topics  . Smoking status: Current Everyday Smoker -- 0.5 packs/day  . Smokeless tobacco: Never Used  . Alcohol Use: Yes     occasional    Allergies:  Allergies  Allergen Reactions  . Amoxicillin Rash  . Prednisone Rash    No prescriptions prior to admission    Review of Systems  Constitutional: Negative for fever and chills.  Respiratory: Negative.   Cardiovascular: Negative.   Gastrointestinal: Negative for nausea, vomiting and abdominal pain.  Genitourinary: Positive for dysuria, urgency and frequency. Negative for hematuria and flank pain.       Negative for abnormal vaginal bleeding or discharge.  Musculoskeletal: Positive for back pain (lower).   Physical Exam   Blood pressure 120/72, pulse 101, temperature 98.5 F (36.9 C), temperature source Oral, resp. rate 16, height 5\' 6"  (1.676 m), weight 78.019 kg (172 lb).  Physical Exam  Constitutional: She is oriented to person, place, and time. She appears  well-developed and well-nourished.  HENT:  Head: Normocephalic.       Hot at night.  Cardiovascular: Normal rate.   Respiratory: Effort normal.  Genitourinary:       Not indicated  Neurological: She is alert and oriented to person, place, and time.  Skin: Skin is warm and dry.  Psychiatric: She has a normal mood and affect. Her behavior is normal.   Results for orders placed during the hospital encounter of 04/02/12 (from the past 24 hour(s))  URINALYSIS, ROUTINE W REFLEX MICROSCOPIC     Status: Abnormal   Collection Time   04/02/12  1:41 PM      Component Value Range   Color, Urine YELLOW  YELLOW    APPearance CLEAR  CLEAR    Specific Gravity, Urine 1.025  1.005 - 1.030    pH 6.0  5.0 - 8.0    Glucose, UA NEGATIVE  NEGATIVE (mg/dL)   Hgb urine dipstick NEGATIVE  NEGATIVE    Bilirubin Urine NEGATIVE  NEGATIVE    Ketones, ur NEGATIVE  NEGATIVE (mg/dL)   Protein, ur NEGATIVE  NEGATIVE (mg/dL)   Urobilinogen, UA 0.2  0.0 - 1.0 (mg/dL)   Nitrite NEGATIVE  NEGATIVE    Leukocytes, UA SMALL (*) NEGATIVE   URINE MICROSCOPIC-ADD ON     Status: Normal   Collection Time   04/02/12  1:41  PM      Component Value Range   Squamous Epithelial / LPF RARE  RARE    WBC, UA 3-6  <3 (WBC/hpf)   RBC / HPF 3-6  <3 (RBC/hpf)   Bacteria, UA RARE  RARE   POCT PREGNANCY, URINE     Status: Normal   Collection Time   04/02/12  1:46 PM      Component Value Range   Preg Test, Ur NEGATIVE  NEGATIVE    MAU Course  Procedures  MDM   Assessment and Plan  A:  Dysuria and Frequency of urination  P:  Rx for Cipro 250mg  bid X 3 days     Continue to flush with po fluids      Re-evaluation with doctor of her choice if sxs persist after treatment.     Void after intercourse.  Pamela Maddy,EVE M 04/02/2012, 1:56 PM

## 2012-04-02 NOTE — MAU Note (Signed)
Pt states she was treated for UTI about 4 weeks ago with cipro and states it got better but symptoms of burning and pressure in vagina has started to reoccur.

## 2012-05-01 ENCOUNTER — Encounter (HOSPITAL_COMMUNITY): Payer: Self-pay | Admitting: *Deleted

## 2012-05-01 ENCOUNTER — Inpatient Hospital Stay (HOSPITAL_COMMUNITY)
Admission: AD | Admit: 2012-05-01 | Discharge: 2012-05-01 | Disposition: A | Discharge status: Home / Self Care | Payer: Medicaid Other | Source: Ambulatory Visit | Attending: Obstetrics and Gynecology | Admitting: Obstetrics and Gynecology

## 2012-05-01 DIAGNOSIS — Z3201 Encounter for pregnancy test, result positive: Secondary | ICD-10-CM

## 2012-05-01 LAB — POCT PREGNANCY, URINE: Preg Test, Ur: POSITIVE — AB

## 2012-05-01 MED ORDER — PRENATAL VITAMINS (DIS) PO TABS: 1.0000 | ORAL_TABLET | Freq: Every day | ORAL | Status: DC

## 2012-05-01 NOTE — MAU Note (Signed)
Needs to verify pregnancy;

## 2012-05-01 NOTE — MAU Provider Note (Signed)
Attestation of Attending Supervision of Advanced Practitioner (CNM/NP): Evaluation and management procedures were performed by the Advanced Practitioner under my supervision and collaboration.  I have reviewed the Advanced Practitioner's note and chart, and I agree with the management and plan.  Jaynie Collins, M.D. 05/01/2012 3:52 PM

## 2012-05-01 NOTE — MAU Provider Note (Signed)
History     CSN: [id number]  Arrival date & time 05/01/12  1135   None     Chief Complaint  Patient presents with  . Possible Pregnancy    HPI Victoria Wright is a 21 y.o. female who presents to MAU for pregnancy verification letter and prenatal vitamins. She denies abdominal pain, vaginal bleeding or discharge. The history was provided by the patient.  Past Medical History  Diagnosis Date  . UTI (lower urinary tract infection)   . Abnormal Pap smear     follow up was wnl  . Acne     Past Surgical History  Procedure Date  . Wisdom tooth extraction     Family History  Problem Relation Age of Onset  . Hypertension Mother   . Diabetes Maternal Grandmother   . Cancer Maternal Grandfather   . Anesthesia problems Neg Hx   . Hypotension Neg Hx   . Pseudochol deficiency Neg Hx   . Malignant hyperthermia Neg Hx     History  Substance Use Topics  . Smoking status: Current Everyday Smoker -- 0.5 packs/day  . Smokeless tobacco: Never Used  . Alcohol Use: Yes     occasional    OB History    Grav Para Term Preterm Abortions TAB SAB Ect Mult Living   3 1 1  1  1   1       Review of Systems: as stated in HPI  Allergies  Amoxicillin and Prednisone  Home Medications  No current outpatient prescriptions on file.  BP 118/60  Pulse 99  Temp 98.2 F (36.8 C) (Oral)  Resp 16  Ht 5\' 5"  (1.651 m)  Wt 170 lb (77.111 kg)  BMI 28.29 kg/m2  LMP 03/22/2012  Physical Exam  Nursing note and vitals reviewed. Constitutional: She is oriented to person, place, and time. She appears well-developed and well-nourished. No distress.  HENT:  Head: Normocephalic.  Eyes: EOM are normal.  Neck: Neck supple.  Cardiovascular: Normal rate.   Pulmonary/Chest: Effort normal.  Musculoskeletal: Normal range of motion.  Neurological: She is alert and oriented to person, place, and time. No cranial nerve deficit.  Skin: Skin is warm and dry.  Psychiatric: She has a normal mood and affect.  Her behavior is normal. Judgment and thought content normal.   Results for orders placed during the hospital encounter of 05/01/12 (from the past 24 hour(s))  POCT PREGNANCY, URINE     Status: Abnormal   Collection Time   05/01/12 11:53 AM      Component Value Range   Preg Test, Ur POSITIVE (*) NEGATIVE   Assessment: Positive pregnancy test  Plan:  Start prenatal vitamins and prenatal care   Pregnancy verification letter given   Rx PNV   Discussed with patient OTC medication for n/v if needed, return as needed. ED Course  Procedures  MDM

## 2012-05-01 NOTE — Discharge Instructions (Signed)
YOU CAN TAKE UNISOM AND VITAMIN B6 FOR NAUSEA OR SLEEP. RETURN HERE AS NEEDED.     ________________________________________     To schedule your Maternity Eligibility Appointment, please call [phone].  When you arrive for your appointment you must bring the following items or information listed below.  Your appointment will be rescheduled if you do not have these items or are 15 minutes late. If currently receiving Medicaid, you MUST bring: 1. Medicaid Card 2. Social Security Card 3. Picture ID 4. Proof of Pregnancy 5. Verification of current address if the address on Medicaid card is incorrect "postmarked mail" If not receiving Medicaid, you MUST bring: 1. Social Security Card 2. Picture ID 3. Birth Certificate (if available) Passport or *Green Card 4. Proof of Pregnancy 5. Verification of current address "postmarked mail" for each income presented. 6. Verification of insurance coverage, if any 7. Check stubs from each employer for the previous month (if unable to present check stub  for each week, we will accept check stub for the first and last week ill the same month.) If you can't locate check stubs, you must bring a letter from the employer(s) and it must have the following information on letterhead, typed, in English: o name of company o company telephone number o how long been with the company, if less than one month o how much person earns per hour o how many hours per week work o the gross pay the person earned for the previous month If you are 22 years old or less, you do not have to bring proof of income unless you work or live with the father of the baby and at that time we will need proof of income from you and/or the father of the baby. Green Card recipients are eligible for Medicaid for Pregnant Women (MPW)    Prenatal Care  WHAT IS PRENATAL CARE?  Prenatal care means health care during your pregnancy, before your baby is born. Take care of yourself and  your baby by:   Getting early prenatal care. If you know you are pregnant, or think you might be pregnant, call your caregiver as soon as possible. Schedule a visit for a general/prenatal examination.   Getting regular prenatal care. Follow your caregiver's schedule for blood and other necessary tests. Do not miss appointments.   Do everything you can to keep yourself and your baby healthy during your pregnancy.   Prenatal care should include evaluation of medical, dietary, educational, psychological, and social needs for the couple and the medical, surgical, and genetic history of the family of the mother and father.   Discuss with your caregiver:   Your medicines, prescription, over-the-counter, and herbal medicines.   Substance abuse, alcohol, smoking, and illegal drugs.   Domestic abuse and violence, if present.   Your immunizations.   Nutrition and diet.   Exercising.   Environment and occupational hazards, at home and at work.   History of sexually transmitted disease, both you and your partner.   Previous pregnancies.  WHY IS PRENATAL CARE SO IMPORTANT?  By seeing you regularly, your caregiver has the chance to find problems early, so that they can be treated as soon as possible. Other problems might be prevented. Many studies have shown that early and regular prenatal care is important for the health of both mothers and their babies.  I AM THINKING ABOUT GETTING PREGNANT. HOW CAN I TAKE CARE OF MYSELF?  Taking care of yourself before you get pregnant helps you to  have a healthy pregnancy. It also lowers your chances of having a baby born with a birth defect. Here are ways to take care of yourself before you get pregnant:   Eat healthy foods, exercise regularly (30 minutes per day for most days of the week is best), and get enough rest and sleep. Talk to your caregiver about what kinds of foods and exercises are best for you.   Take 400 micrograms (mcg) of folic acid (one  of the B vitamins) every day. The best way to do this is to take a daily multivitamin pill that contains this amount of folic acid. Getting enough of the synthetic (manufactured) form of folic acid every day before you get pregnant and during early pregnancy can help prevent certain birth defects. Many breakfast cereals and other grain products have folic acid added to them, but only certain cereals contain 400 mcg of folic acid per serving. Check the label on your multivitamin or cereal to find the amount of folic acid in the food.   See your caregiver for a complete check up before getting pregnant. Make sure that you have had all your immunization shots, especially for rubella ([place] measles). Rubella can cause serious birth defects. Chickenpox is another illness you want to avoid during pregnancy. If you have had chickenpox and rubella in the past, you should be immune to them.   Tell your caregiver about any prescription or non-prescription medicines (including herbal remedies) you are taking. Some medicines are not safe to take during pregnancy.   Stop smoking cigarettes, drinking alcohol, or taking illegal drugs. Ask your caregiver for help, if you need it. You can also get help with alcohol and drugs by talking with a member of your faith community, a counselor, or a trusted friend.   Discuss and treat any medical, social, or psychological problems before getting pregnant.   Discuss any history of genetic problems in the mother, father, and their families. Do genetic testing before getting pregnant, when possible.   Discuss any physical or emotional abuse with your caregiver.   Discuss with your caregiver if you might be exposed to harmful chemicals on your job or where you live.   Discuss with your caregiver if you think your job or the hours you work may be harmful and should be changed.   The father should be involved with the decision making and with all aspects of the pregnancy,  labor, and delivery.   If you have medical insurance, make sure you are covered for pregnancy.  I JUST FOUND OUT THAT I AM PREGNANT. HOW CAN I TAKE CARE OF MYSELF?  Here are ways to take care of yourself and the precious new life growing inside you:   Continue taking your multivitamin with 400 micrograms (mcg) of folic acid every day.   Get early and regular prenatal care. It does not matter if this is your first pregnancy or if you already have children. It is very important to see a caregiver during your pregnancy. Your caregiver will check at each visit to make sure that you and the baby are healthy. If there are any problems, action can be taken right away to help you and the baby.   Eat a healthy diet that includes:   Fruits.   Vegetables.   Foods low in saturated fat.   Grains.   Calcium-rich foods.   Drink 6 to 8 glasses of liquids a day.   Unless your caregiver tells you not to, try  to be physically active for 30 minutes, most days of the week. If you are pressed for time, you can get your activity in through 10 minute segments, three times a day.   If you smoke, drink alcohol, or use drugs, STOP. These can cause long-term damage to your baby. Talk with your caregiver about steps to take to stop smoking. Talk with a member of your faith community, a counselor, a trusted friend, or your caregiver if you are concerned about your alcohol or drug use.   Ask your caregiver before taking any medicine, even over-the-counter medicines. Some medicines are not safe to take during pregnancy.   Get plenty of rest and sleep.   Avoid hot tubs and saunas during pregnancy.   Do not have X-rays taken, unless absolutely necessary and with the recommendation of your caregiver. A lead shield can be placed on your abdomen, to protect the baby when X-rays are taken in other parts of the body.   Do not empty the cat litter when you are pregnant. It may contain a parasite that causes an infection  called toxoplasmosis, which can cause birth defects. Also, use gloves when working in garden areas used by cats.   Do not eat uncooked or undercooked cheese, meats, or fish.   Stay away from toxic chemicals like:   Insecticides.   Solvents (some cleaners or paint thinners).   Lead.   Mercury.   Sexual relations may continue until the end of the pregnancy, unless you have a medical problem or there is a problem with the pregnancy and your caregiver tells you not to.   Do not wear high heel shoes, especially during the second half of the pregnancy. You can lose your balance and fall.   Do not take long trips, unless absolutely necessary. Be sure to see your caregiver before going on the trip.   Do not sit in one position for more than 2 hours, when on a trip.   Take a copy of your medical records when going on a trip.   Know where there is a hospital in the city you are visiting, in case of an emergency.   Most dangerous household products will have pregnancy warnings on their labels. Ask your caregiver about products if you are unsure.   Limit or eliminate your caffeine intake from coffee, tea, sodas, medicines, and chocolate.   Many women continue working through pregnancy. Staying active might help you stay healthier. If you have a question about the safety or the hours you work at your particular job, talk with your caregiver.   Get informed:   Read books.   Watch videos.   Go to childbirth classes for you and the father.   Talk with experienced moms.   Ask your caregiver about childbirth education classes for you and your partner. Classes can help you and your partner prepare for the birth of your baby.   Ask about a pediatrician (baby doctor) and methods and pain medicine for labor, delivery, and possible Cesarean delivery (C-section).  I AM NOT THINKING ABOUT GETTING PREGNANT RIGHT NOW, BUT HEARD THAT ALL WOMEN SHOULD TAKE FOLIC ACID EVERY DAY?  All women of  childbearing age, with even a remote chance of getting pregnant, should try to make sure they get enough folic acid. Many pregnancies are not planned. Many women do not know they are actually pregnant early in their pregnancies, and certain birth defects happen in the very early part of pregnancy. Taking 400 micrograms (  mcg) of folic acid every day will help prevent certain birth defects that happen in the early part of pregnancy. If a woman begins taking vitamin pills in the second or third month of pregnancy, it may be too late to prevent birth defects. Folic acid may also have other health benefits for women, besides preventing birth defects.  HOW OFTEN SHOULD I SEE MY CAREGIVER DURING PREGNANCY?  Your caregiver will give you a schedule for your prenatal visits. You will have visits more often as you get closer to the end of your pregnancy. An average pregnancy lasts about 40 weeks.  A typical schedule includes visiting your caregiver:   About once each month, during your first 6 months of pregnancy.   Every 2 weeks, during the next 2 months.   Weekly in the last month, until the delivery date.  Your caregiver will probably want to see you more often if:  You are over 35.   Your pregnancy is high risk, because you have certain health problems or problems with the pregnancy, such as:   Diabetes.   High blood pressure.   The baby is not growing on schedule, according to the dates of the pregnancy.  Your caregiver will do special tests, to make sure you and the baby are not having any serious problems. WHAT HAPPENS DURING PRENATAL VISITS?   At your first prenatal visit, your caregiver will talk to you about you and your partner's health history and your family's health history, and will do a physical exam.   On your first visit, a physical exam will include checks of your blood pressure, height and weight, and an exam of your pelvic organs. Your caregiver will do a Pap test if you have not  had one recently, and will do cultures of your cervix to make sure there is no infection.   At each visit, there will be tests of your blood, urine, blood pressure, weight, and checking the progress of the baby.   Your caregiver will be able to tell you when to expect that your baby will be born.   Each visit is also a chance for you to learn about staying healthy during pregnancy and for asking questions.   Discuss whether you will be breastfeeding.   At your later prenatal visits, your caregiver will check how you are doing and how the baby is developing. You may have a number of tests done as your pregnancy progresses.   Ultrasound exams are often used to check on the baby's growth and health.   You may have more urine and blood tests, as well as special tests, if needed. These may include amniocentesis (examine fluid in the pregnancy sac), stress tests (check how baby responds to contractions), biophysical profile (measures fetus well-being). Your caregiver will explain the tests and why they are necessary.  I AM IN MY LATE THIRTIES, AND I WANT TO HAVE A CHILD NOW. SHOULD I DO ANYTHING SPECIAL?  As you get older, there is more chance of having a medical problem (high blood pressure), pregnancy problem (preeclampsia, problems with the placenta), miscarriage, or a baby born with a birth defect. However, most women in their late thirties and early forties have healthy babies. See your caregiver on a regular basis before you get pregnant and be sure to go for exams throughout your pregnancy. Your caregiver probably will want to do some special tests to check on you and your baby's health when you are pregnant.  Women today are  often delaying having children until later in life, when they are in their thirties and forties. While many women in their thirties and forties have no difficulty getting pregnant, fertility does decline with age. For women over 40 who cannot get pregnant after 6 months of  trying, it is recommended that they see their caregiver for a fertility evaluation. It is not uncommon to have trouble becoming pregnant or experience infertility (inability to become pregnant after trying for one year). If you think that you or your partner may be infertile, you can discuss this with your caregiver. He or she can recommend treatments such as drugs, surgery, or assisted reproductive technology.  Document Released: 10/27/2003 Document Revised: 10/13/2011 Document Reviewed: 09/23/2009 [facility] Patient Information 2012 [place], [place].

## 2012-05-07 ENCOUNTER — Inpatient Hospital Stay (HOSPITAL_COMMUNITY): Payer: Medicaid Other

## 2012-05-07 ENCOUNTER — Encounter (HOSPITAL_COMMUNITY): Payer: Self-pay | Admitting: *Deleted

## 2012-05-07 ENCOUNTER — Inpatient Hospital Stay (HOSPITAL_COMMUNITY)
Admission: AD | Admit: 2012-05-07 | Discharge: 2012-05-07 | Disposition: A | Discharge status: Home / Self Care | Payer: Medicaid Other | Source: Ambulatory Visit | Attending: Obstetrics & Gynecology | Admitting: Obstetrics & Gynecology

## 2012-05-07 DIAGNOSIS — O26899 Other specified pregnancy related conditions, unspecified trimester: Secondary | ICD-10-CM

## 2012-05-07 DIAGNOSIS — O9989 Other specified diseases and conditions complicating pregnancy, childbirth and the puerperium: Secondary | ICD-10-CM

## 2012-05-07 DIAGNOSIS — R109 Unspecified abdominal pain: Secondary | ICD-10-CM | POA: Insufficient documentation

## 2012-05-07 DIAGNOSIS — O99891 Other specified diseases and conditions complicating pregnancy: Secondary | ICD-10-CM | POA: Insufficient documentation

## 2012-05-07 LAB — URINALYSIS, ROUTINE W REFLEX MICROSCOPIC
Bilirubin Urine: NEGATIVE
Glucose, UA: NEGATIVE mg/dL
Hgb urine dipstick: NEGATIVE
Ketones, ur: NEGATIVE mg/dL
Leukocytes, UA: NEGATIVE
Nitrite: NEGATIVE
Protein, ur: NEGATIVE mg/dL
Specific Gravity, Urine: 1.015 (ref 1.005–1.030)
Urobilinogen, UA: 0.2 mg/dL (ref 0.0–1.0)
pH: 7 (ref 5.0–8.0)

## 2012-05-07 NOTE — Discharge Instructions (Signed)
Abdominal Pain During Pregnancy  Abdominal discomfort is common in pregnancy. Most of the time, it does not cause harm. There are many causes of abdominal pain. Some causes are more serious than others. Some of the causes of abdominal pain in pregnancy are easily diagnosed. Occasionally, the diagnosis takes time to understand. Other times, the cause is not determined. Abdominal pain can be a sign that something is very wrong with the pregnancy, or the pain may have nothing to do with the pregnancy at all. For this reason, always tell your caregiver if you have any abdominal discomfort.  CAUSES  Common and harmless causes of abdominal pain include:   Constipation.   Excess gas and bloating.   Round ligament pain. This is pain that is felt in the folds of the groin.   The position the baby or placenta is in.   Baby kicks.   Braxton-Hicks contractions. These are mild contractions that do not cause cervical dilation.  Serious causes of abdominal pain include:   Ectopic pregnancy. This happens when a fertilized egg implants outside of the uterus.   Miscarriage.   Preterm labor. This is when labor starts at less than 37 weeks of pregnancy.   Placental abruption. This is when the placenta partially or completely separates from the uterus.   Preeclampsia. This is often associated with high blood pressure and has been referred to as "toxemia in pregnancy."   Uterine or amniotic fluid infections.  Causes unrelated to pregnancy include:   Urinary tract infection.   Gallbladder stones or inflammation.   Hepatitis or other liver illness.   Intestinal problems, stomach flu, food poisoning, or ulcer.   Appendicitis.   Kidney (renal) stones.   Kidney infection (pylonephritis).  HOME CARE INSTRUCTIONS   For mild pain:   Do not have sexual intercourse or put anything in your vagina until your symptoms go away completely.   Get plenty of rest until your pain improves. If your pain does not improve in 1 hour, call  your caregiver.   Drink clear fluids if you feel nauseous. Avoid solid food as long as you are uncomfortable or nauseous.   Only take medicine as directed by your caregiver.   Keep all follow-up appointments with your caregiver.  SEEK IMMEDIATE MEDICAL CARE IF:   You are bleeding, leaking fluid, or passing tissue from the vagina.   You have increasing pain or cramping.   You have persistent vomiting.   You have painful or bloody urination.   You have a fever.   You notice a decrease in your baby's movements.   You have extreme weakness or feel faint.   You have shortness of breath, with or without abdominal pain.   You develop a severe headache with abdominal pain.   You have abnormal vaginal discharge with abdominal pain.   You have persistent diarrhea.   You have abdominal pain that continues even after rest, or gets worse.  MAKE SURE YOU:    Understand these instructions.   Will watch your condition.   Will get help right away if you are not doing well or get worse.  Document Released: 10/24/2005 Document Revised: 10/13/2011 Document Reviewed: 05/20/2011  [employer] Patient Information 2012 [employer].

## 2012-05-07 NOTE — MAU Note (Signed)
Pt states no longer having nausea, HA, breast tenderness, constipation.

## 2012-05-07 NOTE — MAU Provider Note (Signed)
Victoria Roselli Rudd20 y.o.G3P1011 @[redacted]w[redacted]d  by LMP Chief Complaint  Patient presents with  . Abdominal Cramping     First Provider Initiated Contact with Patient 05/07/12 1057      SUBJECTIVE  HPI: Presents with a concern that she had been having nausea, breast tenderness, fatigue and constipation -- all symptoms that she attributed to being pregnant-- however she no longer has the symptoms. For the past few days has been having some lower abdominal menstrual-like cramping LMP sure but cycles have been irregular. Moving to [place] next week.  Past Medical History  Diagnosis Date  . UTI (lower urinary tract infection)   . Abnormal Pap smear     follow up was wnl  . Acne    Past Surgical History  Procedure Date  . Wisdom tooth extraction    History   Social History  . Marital Status: Single    Spouse Name: N/A    Number of Children: N/A  . Years of Education: N/A   Occupational History  . Not on file.   Social History Main Topics  . Smoking status: Current Everyday Smoker -- 0.5 packs/day  . Smokeless tobacco: Never Used  . Alcohol Use: Yes     occasional  . Drug Use: No  . Sexually Active: Yes    Birth Control/ Protection: None   Other Topics Concern  . Not on file   Social History Narrative  . No narrative on file   No current facility-administered medications on file prior to encounter.   No current outpatient prescriptions on file prior to encounter.   Allergies  Allergen Reactions  . Amoxicillin Rash  . Prednisone Rash    ROS: Pertinent items in HPI  OBJECTIVE Blood pressure 117/55, pulse 82, temperature 99.4 F (37.4 C), resp. rate 16, height 5\' 6"  (1.676 m), weight 81.103 kg (178 lb 12.8 oz), last menstrual period 03/22/2012, SpO2 100.00%.  GENERAL: Well-developed, well-nourished female in no acute distress.  HEENT: Normocephalic, good dentition HEART: normal rate RESP: normal effort ABDOMEN: Soft, nontender EXTREMITIES: Nontender, no  edema NEURO: Alert and oriented   LAB RESULTS  Results for orders placed during the hospital encounter of 05/07/12 (from the past 24 hour(s))  URINALYSIS, ROUTINE W REFLEX MICROSCOPIC     Status: Normal   Collection Time   05/07/12 10:30 AM      Component Value Range   Color, Urine YELLOW  YELLOW   APPearance CLEAR  CLEAR   Specific Gravity, Urine 1.015  1.005 - 1.030   pH 7.0  5.0 - 8.0   Glucose, UA NEGATIVE  NEGATIVE mg/dL   Hgb urine dipstick NEGATIVE  NEGATIVE   Bilirubin Urine NEGATIVE  NEGATIVE   Ketones, ur NEGATIVE  NEGATIVE mg/dL   Protein, ur NEGATIVE  NEGATIVE mg/dL   Urobilinogen, UA 0.2  0.0 - 1.0 mg/dL   Nitrite NEGATIVE  NEGATIVE   Leukocytes, UA NEGATIVE  NEGATIVE    IMAGING Bedside US: unable to see FH Formal US: 107w5d by IUGS size, YS seen, no fetal pole; adnexae nl  ASSESSMENT G3P1011   IUP at71w5d BEGA by [place] 1. Abdominal pain in pregnancy, antepartum     PLAN  Medication List  As of 05/07/2012 11:11 AM   ASK your doctor about these medications         prenatal multivitamin Tabs   Take 1 tablet by mouth every morning.           follow up Make an appointment as soon as  possible in [place]. Pregnancy precautions reviewed     Carry Ortez 05/07/2012 11:11 AM

## 2012-05-07 NOTE — MAU Note (Signed)
Patient states she has not been having the pregnancy symptoms like she had before and has had a miscarriage and is afraid that might be happening. Has some cramping for the past coupled days. None now.

## 2012-06-15 ENCOUNTER — Other Ambulatory Visit: Payer: Self-pay

## 2012-06-15 LAB — OB RESULTS CONSOLE ANTIBODY SCREEN: Antibody Screen: NEGATIVE

## 2012-06-15 LAB — OB RESULTS CONSOLE RUBELLA ANTIBODY, IGM: Rubella: IMMUNE

## 2012-06-15 LAB — OB RESULTS CONSOLE HIV ANTIBODY (ROUTINE TESTING): HIV: NONREACTIVE

## 2012-06-15 LAB — OB RESULTS CONSOLE HEPATITIS B SURFACE ANTIGEN: Hepatitis B Surface Ag: NEGATIVE

## 2012-06-15 LAB — OB RESULTS CONSOLE RPR: RPR: NONREACTIVE

## 2012-08-07 NOTE — Consult Note (Signed)
I have seen @PTNAME @ and examination done.  I agree with documentation and plan as noted in resident/PA's note. Ladarius Seubert V 08/07/2012 3:45 PM

## 2012-08-09 ENCOUNTER — Inpatient Hospital Stay (HOSPITAL_COMMUNITY)
Admission: AD | Admit: 2012-08-09 | Discharge: 2012-08-09 | Disposition: A | Discharge status: Home / Self Care | Payer: Medicaid Other | Source: Ambulatory Visit | Attending: Obstetrics | Admitting: Obstetrics

## 2012-08-09 ENCOUNTER — Encounter (HOSPITAL_COMMUNITY): Payer: Self-pay

## 2012-08-09 ENCOUNTER — Inpatient Hospital Stay (HOSPITAL_COMMUNITY): Payer: Medicaid Other

## 2012-08-09 DIAGNOSIS — N949 Unspecified condition associated with female genital organs and menstrual cycle: Secondary | ICD-10-CM

## 2012-08-09 DIAGNOSIS — R109 Unspecified abdominal pain: Secondary | ICD-10-CM | POA: Insufficient documentation

## 2012-08-09 DIAGNOSIS — O26859 Spotting complicating pregnancy, unspecified trimester: Secondary | ICD-10-CM

## 2012-08-09 LAB — URINALYSIS, ROUTINE W REFLEX MICROSCOPIC
Bilirubin Urine: NEGATIVE
Glucose, UA: NEGATIVE mg/dL
Hgb urine dipstick: NEGATIVE
Ketones, ur: NEGATIVE mg/dL
Leukocytes, UA: NEGATIVE
Nitrite: NEGATIVE
Protein, ur: NEGATIVE mg/dL
Specific Gravity, Urine: 1.015 (ref 1.005–1.030)
Urobilinogen, UA: 0.2 mg/dL (ref 0.0–1.0)
pH: 7.5 (ref 5.0–8.0)

## 2012-08-09 MED ORDER — COMFORT FIT MATERNITY SUPP MED MISC: 1.0000 [IU] | Status: DC | PRN

## 2012-08-09 NOTE — MAU Note (Signed)
Patient states she has been having a lower abdominal pulling after lifting at her job yesterday. Has had spotting on and off since yesterday. None now and not wearing a pad. Has not felt the baby move as much as usual.

## 2012-08-09 NOTE — MAU Provider Note (Signed)
History     CSN: [id number]  Arrival date and time: 08/09/12 1741   None     Chief Complaint  Patient presents with  . Abdominal Pain  . Vaginal Bleeding   HPI 21 y.o. 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 Spotting mostly yesterday, decreased today. Shooting pelvic pain, mostly left sided, but radiates across with urination.    Past Medical History  Diagnosis Date  . UTI (lower urinary tract infection)   . Abnormal Pap smear     follow up was wnl  . Acne     Past Surgical History  Procedure Date  . Wisdom tooth extraction     Family History  Problem Relation Age of Onset  . Hypertension Mother   . Diabetes Maternal Grandmother   . Cancer Maternal Grandfather   . Anesthesia problems Neg Hx   . Hypotension Neg Hx   . Pseudochol deficiency Neg Hx   . Malignant hyperthermia Neg Hx     History  Substance Use Topics  . Smoking status: Former Smoker -- 0.5 packs/day    Types: Cigarettes    Quit date: 05/09/2012  . Smokeless tobacco: Never Used  . Alcohol Use: No     occasional    Allergies:  Allergies  Allergen Reactions  . Amoxicillin Rash  . Prednisone Rash    Prescriptions prior to admission  Medication Sig Dispense Refill  . Prenatal Vit-Fe Fumarate-FA (PRENATAL MULTIVITAMIN) TABS Take 1 tablet by mouth every morning.        Review of Systems  Constitutional: Negative.   Respiratory: Negative.   Cardiovascular: Negative.   Gastrointestinal: Positive for abdominal pain. Negative for nausea, vomiting, diarrhea and constipation.  Genitourinary: Negative for dysuria, urgency, frequency, hematuria and flank pain.       Positive for vaginal bleeding  Musculoskeletal: Negative.   Neurological: Negative.   Psychiatric/Behavioral: Negative.    Physical Exam   Blood pressure 114/61, pulse 64, temperature 99.1 F (37.3 C), temperature source Oral, resp. rate 16, height 5\' 6"  (1.676 m), weight 196 lb  3.2 oz (88.996 kg), last menstrual period 03/22/2012, SpO2 100.00%, unknown if currently breastfeeding.  Physical Exam  Nursing note and vitals reviewed. Constitutional: She is oriented to person, place, and time. She appears well-developed and well-nourished. No distress.  Cardiovascular: Normal rate.   Respiratory: Effort normal.  GI: Soft. There is no tenderness.  Genitourinary: No bleeding around the vagina. Vaginal discharge (white) found.       Cervix visually closed   Musculoskeletal: Normal range of motion.  Neurological: She is alert and oriented to person, place, and time.  Skin: Skin is warm and dry.  Psychiatric: She has a normal mood and affect.    MAU Course  Procedures  Results for orders placed during the hospital encounter of 08/09/12 (from the past 24 hour(s))  URINALYSIS, ROUTINE W REFLEX MICROSCOPIC     Status: Abnormal   Collection Time   08/09/12  6:18 PM      Component Value Range   Color, Urine YELLOW  YELLOW   APPearance HAZY (*) CLEAR   Specific Gravity, Urine 1.015  1.005 - 1.030   pH 7.5  5.0 - 8.0   Glucose, UA NEGATIVE  NEGATIVE mg/dL   Hgb urine dipstick NEGATIVE  NEGATIVE   Bilirubin Urine NEGATIVE  NEGATIVE   Ketones, ur NEGATIVE  NEGATIVE mg/dL   Protein, ur NEGATIVE  NEGATIVE mg/dL   Urobilinogen, UA  0.2  0.0 - 1.0 mg/dL   Nitrite NEGATIVE  NEGATIVE   Leukocytes, UA NEGATIVE  NEGATIVE   U/S: cervical length 4 cm, closed; placenta posterior above os  Assessment and Plan  21 y.o. G3P1011 at [redacted]w[redacted]d Spotting in pregnancy Round ligament pain  Follow-up Information    Follow up with HARPER,CHARLES A, MD. (as scheduled)    Contact information:   [address] [place] [address] [phone]         Precautions rev'd Belva Koziel 08/09/2012, 7:44 PM

## 2012-08-16 ENCOUNTER — Other Ambulatory Visit: Payer: Self-pay | Admitting: Obstetrics

## 2012-08-16 DIAGNOSIS — O358XX0 Maternal care for other (suspected) fetal abnormality and damage, not applicable or unspecified: Secondary | ICD-10-CM

## 2012-08-16 DIAGNOSIS — O35BXX0 Maternal care for other (suspected) fetal abnormality and damage, fetal cardiac anomalies, not applicable or unspecified: Secondary | ICD-10-CM

## 2012-08-17 ENCOUNTER — Ambulatory Visit (HOSPITAL_COMMUNITY)
Admission: RE | Admit: 2012-08-17 | Discharge: 2012-08-17 | Disposition: A | Discharge status: Home / Self Care | Payer: Medicaid Other | Source: Ambulatory Visit | Attending: Obstetrics | Admitting: Obstetrics

## 2012-08-17 ENCOUNTER — Encounter (HOSPITAL_COMMUNITY): Payer: Self-pay

## 2012-08-17 VITALS — BP 119/53 | HR 93 | Wt 199.5 lb

## 2012-08-17 DIAGNOSIS — Z363 Encounter for antenatal screening for malformations: Secondary | ICD-10-CM | POA: Insufficient documentation

## 2012-08-17 DIAGNOSIS — O35BXX0 Maternal care for other (suspected) fetal abnormality and damage, fetal cardiac anomalies, not applicable or unspecified: Secondary | ICD-10-CM

## 2012-08-17 DIAGNOSIS — O358XX0 Maternal care for other (suspected) fetal abnormality and damage, not applicable or unspecified: Secondary | ICD-10-CM | POA: Insufficient documentation

## 2012-08-17 DIAGNOSIS — Z1389 Encounter for screening for other disorder: Secondary | ICD-10-CM | POA: Insufficient documentation

## 2012-08-17 NOTE — Progress Notes (Signed)
Victoria Wright  was seen today for an ultrasound appointment.  See full report in [facility].  Alpha Gula, MD  Ms. [name] was seen today due to echogenic intracardiac focus on recent outside ultrasound.  This finding is confirmed on today's study.  Echogenic intracardiac focus (EIF) occurs in approximatley 4-5% of normal pregnancies.  It is not associated with congenital heart defects and is considered a normal anatomic variant.  EIF is considered a soft marker for Down syndrome.  In the absence of other findings and based on her age-related risk for Down syndrome, she is still considered to be low risk for Down syndrome.    Single IUP at 20 5/7 weeks Echogenic intracardiac focus noted in the left ventricle No other markers assocaited with aneuploidy was noted Remainder of the anatomy was within normal limits Normal amniotic fluid volume  Patient previously declined quad screen.  Recommend follow up ultrasounds as clinically indicated

## 2012-08-21 ENCOUNTER — Ambulatory Visit (HOSPITAL_COMMUNITY): Payer: Medicaid Other

## 2012-09-20 ENCOUNTER — Other Ambulatory Visit: Payer: Self-pay | Admitting: Obstetrics

## 2012-09-20 DIAGNOSIS — IMO0002 Reserved for concepts with insufficient information to code with codable children: Secondary | ICD-10-CM

## 2012-09-26 ENCOUNTER — Ambulatory Visit (HOSPITAL_COMMUNITY): Payer: Medicaid Other

## 2012-10-03 ENCOUNTER — Other Ambulatory Visit (HOSPITAL_COMMUNITY): Payer: Medicaid Other

## 2012-11-07 NOTE — L&D Delivery Note (Signed)
Delivery Note At 6:31 PM a viable female was delivered via Vaginal, Spontaneous Delivery (Presentation: ; Occiput Anterior).  APGAR: , ; weight .   Placenta status: Intact, Spontaneous.  Cord:  with the following complications: None.  Cord pH: not done  Anesthesia: Epidural  Episiotomy: None Lacerations:  Suture Repair: 2.0 vicryl Est. Blood Loss (mL):   Mom to postpartum.  Baby to nursery-stable.  MARSHALL,BERNARD A 12/22/2012, 6:43 PM

## 2012-11-17 ENCOUNTER — Encounter (HOSPITAL_COMMUNITY): Payer: Self-pay | Admitting: Obstetrics and Gynecology

## 2012-11-17 ENCOUNTER — Inpatient Hospital Stay (HOSPITAL_COMMUNITY)
Admission: AD | Admit: 2012-11-17 | Discharge: 2012-11-17 | Disposition: A | Discharge status: Home / Self Care | Payer: Medicaid Other | Source: Ambulatory Visit | Attending: Obstetrics & Gynecology | Admitting: Obstetrics & Gynecology

## 2012-11-17 DIAGNOSIS — O99891 Other specified diseases and conditions complicating pregnancy: Secondary | ICD-10-CM

## 2012-11-17 DIAGNOSIS — R55 Syncope and collapse: Secondary | ICD-10-CM

## 2012-11-17 DIAGNOSIS — O2650 Maternal hypotension syndrome, unspecified trimester: Secondary | ICD-10-CM | POA: Insufficient documentation

## 2012-11-17 LAB — CBC
HCT: 32.5 % — ABNORMAL LOW (ref 36.0–46.0)
Hemoglobin: 11.1 g/dL — ABNORMAL LOW (ref 12.0–15.0)
MCH: 29.8 pg (ref 26.0–34.0)
MCHC: 34.2 g/dL (ref 30.0–36.0)
MCV: 87.1 fL (ref 78.0–100.0)
Platelets: 185 10*3/uL (ref 150–400)
RBC: 3.73 MIL/uL — ABNORMAL LOW (ref 3.87–5.11)
RDW: 13.1 % (ref 11.5–15.5)
WBC: 7.1 10*3/uL (ref 4.0–10.5)

## 2012-11-17 LAB — BASIC METABOLIC PANEL
BUN: 7 mg/dL (ref 6–23)
CO2: 19 mEq/L (ref 19–32)
Calcium: 9 mg/dL (ref 8.4–10.5)
Chloride: 103 mEq/L (ref 96–112)
Creatinine, Ser: 0.46 mg/dL — ABNORMAL LOW (ref 0.50–1.10)
GFR calc Af Amer: >90 mL/min (ref >90)
GFR calc non Af Amer: >90 mL/min (ref >90)
Glucose, Bld: 129 mg/dL — ABNORMAL HIGH (ref 70–99)
Potassium: 3.8 mEq/L (ref 3.5–5.1)
Sodium: 133 mEq/L — ABNORMAL LOW (ref 135–145)

## 2012-11-17 LAB — URINALYSIS, ROUTINE W REFLEX MICROSCOPIC
Bilirubin Urine: NEGATIVE
Glucose, UA: NEGATIVE mg/dL
Hgb urine dipstick: NEGATIVE
Ketones, ur: NEGATIVE mg/dL
Leukocytes, UA: NEGATIVE
Nitrite: NEGATIVE
Protein, ur: NEGATIVE mg/dL
Specific Gravity, Urine: 1.015 (ref 1.005–1.030)
Urobilinogen, UA: 0.2 mg/dL (ref 0.0–1.0)
pH: 6.5 (ref 5.0–8.0)

## 2012-11-17 LAB — WET PREP, GENITAL
Clue Cells Wet Prep HPF POC: NONE SEEN
Trich, Wet Prep: NONE SEEN
Yeast Wet Prep HPF POC: NONE SEEN

## 2012-11-17 NOTE — MAU Provider Note (Signed)
Chief Complaint:  Near Syncope   First Provider Initiated Contact with Patient 11/17/12 0945      HPI: Victoria Wright is a 22 y.o. G4P1011 at [redacted]w[redacted]d who presents to maternity admissions reporting 2 week history of occasionally feeling lightheaded and seeing black spots. Today while she was up On her feet a lot at work.Her symptoms of dizziness worsened and she again had a visual disturbance feel feeling like she was about to blackout symptoms abated when she sat down. History negative for fainting or exercise intolerance. She has reported symptoms at her prenatal visit and was reassured normalcy. Denies diaphoresis episodes of heart racing. States she stays well hydrated and hence had a lot of fluids today. Also reports a change in her vaginal discharge which is copious amount of white non-irritative nonpruritic discharge. Denies contractions, leakage of fluid or vaginal bleeding. Good fetal movement.   Pregnancy Course: Essentially uncomplicated course  Past Medical History: Past Medical History  Diagnosis Date  . UTI (lower urinary tract infection)   . Abnormal Pap smear     follow up was wnl  . Acne     Past obstetric history: OB History    Grav Para Term Preterm Abortions TAB SAB Ect Mult Living   4 1 1  1  1   1      # Outc Date GA Lbr Len/2nd Wgt Sex Del Anes PTL Lv   1 CUR            2 TRM      SVD      3 SAB            4 GRA            Comments: System Generated. Please review and update pregnancy details.      Past Surgical History: Past Surgical History  Procedure Date  . Wisdom tooth extraction     Family History: Family History  Problem Relation Age of Onset  . Hypertension Mother   . Diabetes Maternal Grandmother   . Cancer Maternal Grandfather   . Anesthesia problems Neg Hx   . Hypotension Neg Hx   . Pseudochol deficiency Neg Hx   . Malignant hyperthermia Neg Hx     Social History: History  Substance Use Topics  . Smoking status: Former Smoker -- 0.5  packs/day    Types: Cigarettes    Quit date: 05/09/2012  . Smokeless tobacco: Never Used  . Alcohol Use: No     Comment: occasional    Allergies:  Allergies  Allergen Reactions  . Amoxicillin Rash  . Prednisone Rash    Meds:  Prescriptions prior to admission  Medication Sig Dispense Refill  . albuterol (PROVENTIL HFA;VENTOLIN HFA) 108 (90 BASE) MCG/ACT inhaler Inhale 2 puffs into the lungs every 6 (six) hours as needed. SOB      . Prenatal Vit-Fe Fumarate-FA (PRENATAL MULTIVITAMIN) TABS Take 1 tablet by mouth every morning.      . Elastic Bandages & Supports (COMFORT FIT MATERNITY SUPP MED) MISC 1 Units by Does not apply route as needed.  1 each  0    ROS: Pertinent findings in history of present illness.  Physical Exam  Blood pressure 103/42, pulse 151, temperature 98.7 F (37.1 C), temperature source Oral, resp. rate 18, height 5\' 6"  (1.676 m), weight 225 lb (102.059 kg), last menstrual period 03/22/2012, SpO2 100.00%. Orthostatic vital signs with significant drop in blood pressure: 128/69, 107 lying-> 103/42, 151 on standing GENERAL: Well-developed, well-nourished  female in no acute distress.  HEENT: normocephalic HEART: Regular rate and rhythm without murmur RESP: normal effort, Lungs clear to auscultation bilaterally ABDOMEN: Soft, non-tender, gravid appropriate for gestational age EXTREMITIES: Nontender, no edema NEURO: alert and oriented SPECULUM EXAM: NEFG, physiologic discharge, no blood, cervix clean Dilation: Fingertip Cervical Position: Posterior Station: -3 Presentation: Undeterminable Exam by:: D. Anish Vana CNM   FHT:  Baseline 130 , moderate variability, accelerations present, no decelerations Contractions: UI   Labs: Results for orders placed during the hospital encounter of 11/17/12 (from the past 24 hour(s))  URINALYSIS, ROUTINE W REFLEX MICROSCOPIC     Status: Normal   Collection Time   11/17/12  9:15 AM      Component Value Range   Color, Urine YELLOW   YELLOW   APPearance CLEAR  CLEAR   Specific Gravity, Urine 1.015  1.005 - 1.030   pH 6.5  5.0 - 8.0   Glucose, UA NEGATIVE  NEGATIVE mg/dL   Hgb urine dipstick NEGATIVE  NEGATIVE   Bilirubin Urine NEGATIVE  NEGATIVE   Ketones, ur NEGATIVE  NEGATIVE mg/dL   Protein, ur NEGATIVE  NEGATIVE mg/dL   Urobilinogen, UA 0.2  0.0 - 1.0 mg/dL   Nitrite NEGATIVE  NEGATIVE   Leukocytes, UA NEGATIVE  NEGATIVE  CBC     Status: Abnormal   Collection Time   11/17/12  9:47 AM      Component Value Range   WBC 7.1  4.0 - 10.5 K/uL   RBC 3.73 (*) 3.87 - 5.11 MIL/uL   Hemoglobin 11.1 (*) 12.0 - 15.0 g/dL   HCT 78.2 (*) 95.6 - 21.3 %   MCV 87.1  78.0 - 100.0 fL   MCH 29.8  26.0 - 34.0 pg   MCHC 34.2  30.0 - 36.0 g/dL   RDW 08.6  57.8 - 46.9 %   Platelets 185  150 - 400 K/uL  BASIC METABOLIC PANEL     Status: Abnormal   Collection Time   11/17/12  9:47 AM      Component Value Range   Sodium 133 (*) 135 - 145 mEq/L   Potassium 3.8  3.5 - 5.1 mEq/L   Chloride 103  96 - 112 mEq/L   CO2 19  19 - 32 mEq/L   Glucose, Bld 129 (*) 70 - 99 mg/dL   BUN 7  6 - 23 mg/dL   Creatinine, Ser 6.29 (*) 0.50 - 1.10 mg/dL   Calcium 9.0  8.4 - 52.8 mg/dL   GFR calc non Af Amer >90  >90 mL/min   GFR calc Af Amer >90  >90 mL/min  WET PREP, GENITAL     Status: Abnormal   Collection Time   11/17/12 10:05 AM      Component Value Range   Yeast Wet Prep HPF POC NONE SEEN  NONE SEEN   Trich, Wet Prep NONE SEEN  NONE SEEN   Clue Cells Wet Prep HPF POC NONE SEEN  NONE SEEN   WBC, Wet Prep HPF POC FEW (*) NONE SEEN    Imaging:  No results found. MAU Course: Pulse after rest 100  Assessment: 1. Vasovagal near syncope   P1 at [redacted]w[redacted]d  Plan:  Will discuss with Dr. Clearance Coots Discharge home PDS on near-syncope Work excuse until Monday.  Call office on Monday if symptoms persist in otherwise keep routine scheduled prenatal visit stress keeping well hydrated  Follow-up Information    Follow up with Roseanna Rainbow,  MD. (Call office Monday if  symptoms worsen)    Contact information:   [address], [address] [place] [address] [phone]            Medication List     As of 11/17/2012 11:05 AM    TAKE these medications         albuterol 108 (90 BASE) MCG/ACT inhaler   Commonly known as: PROVENTIL HFA;VENTOLIN HFA   Inhale 2 puffs into the lungs every 6 (six) hours as needed. SOB      COMFORT FIT MATERNITY SUPP MED Misc   1 Units by Does not apply route as needed.      prenatal multivitamin Tabs   Take 1 tablet by mouth every morning.         Danae Orleans, CNM 11/17/2012 10:06 AM

## 2012-11-17 NOTE — MAU Note (Addendum)
Pt reports she has dizzyness and lightheadedness for the past few weeks. todday she was at work and started seeing spots and felt like she was going to pass out. Also reporting an increase in vaginal discharge that is white and milky looking Pt also reports having increased pelvic pressure and cramping

## 2012-12-01 LAB — OB RESULTS CONSOLE GC/CHLAMYDIA
Chlamydia: NEGATIVE
Gonorrhea: NEGATIVE

## 2012-12-04 ENCOUNTER — Inpatient Hospital Stay (EMERGENCY_DEPARTMENT_HOSPITAL)
Admission: AD | Admit: 2012-12-04 | Discharge: 2012-12-04 | Disposition: A | Discharge status: Home / Self Care | Payer: Medicaid Other | Source: Ambulatory Visit | Attending: Obstetrics | Admitting: Obstetrics

## 2012-12-04 ENCOUNTER — Encounter (HOSPITAL_COMMUNITY): Payer: Self-pay

## 2012-12-04 ENCOUNTER — Inpatient Hospital Stay (HOSPITAL_COMMUNITY)
Admission: AD | Admit: 2012-12-04 | Discharge: 2012-12-04 | Disposition: A | Discharge status: Home / Self Care | Payer: Medicaid Other | Source: Intra-hospital | Attending: Obstetrics | Admitting: Obstetrics

## 2012-12-04 DIAGNOSIS — R51 Headache: Secondary | ICD-10-CM | POA: Insufficient documentation

## 2012-12-04 DIAGNOSIS — O4700 False labor before 37 completed weeks of gestation, unspecified trimester: Secondary | ICD-10-CM

## 2012-12-04 DIAGNOSIS — O479 False labor, unspecified: Secondary | ICD-10-CM

## 2012-12-04 HISTORY — DX: Hypotension, unspecified: I95.9

## 2012-12-04 LAB — URINALYSIS, ROUTINE W REFLEX MICROSCOPIC
Bilirubin Urine: NEGATIVE
Glucose, UA: NEGATIVE mg/dL
Hgb urine dipstick: NEGATIVE
Ketones, ur: NEGATIVE mg/dL
Leukocytes, UA: NEGATIVE
Nitrite: NEGATIVE
Protein, ur: NEGATIVE mg/dL
Specific Gravity, Urine: 1.015 (ref 1.005–1.030)
Urobilinogen, UA: 0.2 mg/dL (ref 0.0–1.0)
pH: 7 (ref 5.0–8.0)

## 2012-12-04 MED ORDER — ACETAMINOPHEN 325 MG PO TABS: 650.0000 mg | ORAL_TABLET | Freq: Once | ORAL | Status: DC

## 2012-12-04 MED ORDER — LACTATED RINGERS IV BOLUS (SEPSIS)
1000.0000 mL | Freq: Once | INTRAVENOUS | Status: AC
  Administered 2012-12-04: 1000 mL via INTRAVENOUS

## 2012-12-04 MED ORDER — NIFEDIPINE 10 MG PO CAPS
20.0000 mg | ORAL_CAPSULE | Freq: Once | ORAL | Status: AC
  Administered 2012-12-04: 20 mg via ORAL
  Filled 2012-12-04: qty 2

## 2012-12-04 MED ORDER — LACTATED RINGERS IV BOLUS (SEPSIS): 1000.0000 mL | Freq: Once | INTRAVENOUS | Status: DC

## 2012-12-04 NOTE — MAU Provider Note (Signed)
Chief Complaint:  Contractions   First Provider Initiated Contact with Patient 12/04/12 [phone]      HPI: Victoria Wright is a 22 y.o. G3P1011 at [redacted]w[redacted]d who presents to maternity admissions reporting contractions. The patient states that she started having contractions yesterday, they were 2-5 minutes about then. Her contractions are similar today, but feel stronger in intensity. She denies LOF, vaginal bleeding, abnormal discharge, abdominal pain, fever or N/V. She reports good fetal movement.   The patient is also complaining of nasal congestion, ST and cough x 3 days. She denies fever or HA.    Past Medical History: Past Medical History  Diagnosis Date  . UTI (lower urinary tract infection)   . Abnormal Pap smear     follow up was wnl  . Acne   . Hypotension     taken out of work r/t bp drops    Past obstetric history: OB History    Grav Para Term Preterm Abortions TAB SAB Ect Mult Living   3 1 1  1  1   1      # Outc Date GA Lbr Len/2nd Wgt Sex Del Anes PTL Lv   1 TRM      SVD      2 SAB            3 CUR               Past Surgical History: Past Surgical History  Procedure Date  . Wisdom tooth extraction     Family History: Family History  Problem Relation Age of Onset  . Hypertension Mother   . Diabetes Maternal Grandmother   . Cancer Maternal Grandfather   . Anesthesia problems Neg Hx   . Hypotension Neg Hx   . Pseudochol deficiency Neg Hx   . Malignant hyperthermia Neg Hx     Social History: History  Substance Use Topics  . Smoking status: Former Smoker -- 0.5 packs/day    Types: Cigarettes    Quit date: 05/09/2012  . Smokeless tobacco: Never Used  . Alcohol Use: No     Comment: occasional    Allergies:  Allergies  Allergen Reactions  . Amoxicillin Rash  . Prednisone Rash    Meds:  Prescriptions prior to admission  Medication Sig Dispense Refill  . albuterol (PROVENTIL HFA;VENTOLIN HFA) 108 (90 BASE) MCG/ACT inhaler Inhale 2 puffs into the lungs  every 6 (six) hours as needed. SOB      . guaifenesin (ROBITUSSIN) 100 MG/5ML syrup Take 400 mg by mouth daily as needed. For cold symptoms      . Prenatal Vit-Fe Fumarate-FA (PRENATAL MULTIVITAMIN) TABS Take 1 tablet by mouth every morning.        ROS: Pertinent findings in history of present illness.  Physical Exam  Blood pressure 122/53, pulse 116, temperature 98.1 F (36.7 C), temperature source Oral, resp. rate 18, height 5\' 6"  (1.676 m), weight 228 lb 6.4 oz (103.602 kg), last menstrual period 03/22/2012, SpO2 100.00%. GENERAL: Well-developed, well-nourished female in no acute distress.  HEENT: normocephalic HEART: normal rate and rhythm RESP: normal effort, clear to auscultation ABDOMEN: Soft, non-tender, gravid appropriate for gestational age EXTREMITIES: Nontender, no edema NEURO: alert and oriented Dilation: 3 Effacement (%): 60 Cervical Position: Posterior Station: -3 Presentation: Vertex Exam by:: SBeck, RN  FHT:  Baseline 125 , moderate variability, accelerations present, no decelerations Contractions: q 2-3 mins   Labs: Results for orders placed during the hospital encounter of 12/04/12 (from the past 24  hour(s))  URINALYSIS, ROUTINE W REFLEX MICROSCOPIC     Status: Abnormal   Collection Time   12/04/12  9:00 AM      Component Value Range   Color, Urine YELLOW  YELLOW   APPearance HAZY (*) CLEAR   Specific Gravity, Urine 1.015  1.005 - 1.030   pH 7.0  5.0 - 8.0   Glucose, UA NEGATIVE  NEGATIVE mg/dL   Hgb urine dipstick NEGATIVE  NEGATIVE   Bilirubin Urine NEGATIVE  NEGATIVE   Ketones, ur NEGATIVE  NEGATIVE mg/dL   Protein, ur NEGATIVE  NEGATIVE mg/dL   Urobilinogen, UA 0.2  0.0 - 1.0 mg/dL   Nitrite NEGATIVE  NEGATIVE   Leukocytes, UA NEGATIVE  NEGATIVE    Imaging:   MAU Course: Discussed patient with Dr. Clearance Coots. He recommends 2L LR bolus and 20 mg procardia. Repeat procardia in 20 minutes if needed.  He also asked for GBS. Patient states that she is  sure she had one done in the office last week. Called [facility]. Patient had GBS on 11/30/12 - results negative. Copy faxed to MAU. Will be scanned into patient's chart.   1L LR given and 20 mg procardia. Patient still contracting. Another 20 mg procardia given. Contractions have stopped. Patient to receive 2nd L LR.  After 2nd L LR patient began to feel contractions again. Monitor is able to pick up occasional contractions as well RN to check for change in cervix. Patient now dilated to 3 cm and having frequent more intense contractions.  Discussed patient with Dr. Clearance Coots. He does not want to be more aggressive with tocolytics given her GA. He recommends monitoring 1-2 hours more to evaluate contractions and progress.   1420 - patient is complaining of headache, 8/10. Tylenol given. BP and pulse checked - BP normal, slight tachycardia.  1520 - patient on monitor x 1 hour. Contractions approx. q 8 minutes, less intense. Cervix check and unchanged from previous.  Discussed with Dr. Clearance Coots. He would like to discharge patient at this time with labor precautions. Recommends that she keep scheduled follow-up with Dr. Tamela Oddi  Assessment: 1. Preterm contractions     Plan: Discharge home Labor precautions and fetal kick counts discussed Patient should keep scheduled follow-up with Dr. Tamela Oddi Patient may return to MAU as needed or if her condition were to change or worsen      Follow-up Information    Follow up with Roseanna Rainbow, MD. (As scheduled)    Contact information:   [address] [place] [address] [phone]       Follow up with [facility] [place] MATERNITY ADMISSIONS. (As needed if symptoms worsen)    Contact information:   [address] [place] [address] [phone]          Medication List     As of 12/04/2012  3:13 PM    TAKE these medications         albuterol 108 (90 BASE) MCG/ACT inhaler     Commonly known as: PROVENTIL HFA;VENTOLIN HFA   Inhale 2 puffs into the lungs every 6 (six) hours as needed. SOB      guaifenesin 100 MG/5ML syrup   Commonly known as: ROBITUSSIN   Take 400 mg by mouth daily as needed. For cold symptoms      prenatal multivitamin Tabs   Take 1 tablet by mouth every morning.         Freddi Starr, PA-C 12/04/2012 3:13  PM   

## 2012-12-04 NOTE — MAU Note (Signed)
Pt states was having ctx's yesterday, then noticed today ctx's were 2-3 minutes apart at times. Denies bleeding or vag d/c changes. Rates ctx's 5/10.

## 2012-12-04 NOTE — MAU Provider Note (Signed)
Chief Complaint:  No chief complaint on file.  First Provider Initiated Contact with Patient 12/04/12 2152    HPI: Victoria Wright is a 22 y.o. G3P1011 at [redacted]w[redacted]d who presents to maternity admissions reporting preterm contractions. She was seen in MAU for contractions this morning and dilated from closed to 3 cm. He was given IV fluids and Procardia. Contractions decreased and she did not dilate any further. Contractions worsened again this evening and are now the same frequency and intensity as this morning. She states they are 2 minutes apart and mild-moderate. Had a small damp spot in her underwear at 1700, but no other leaking. Denies vaginal bleeding. Good fetal movement.   Past Medical History: Past Medical History  Diagnosis Date  . UTI (lower urinary tract infection)   . Abnormal Pap smear     follow up was wnl  . Acne   . Hypotension     taken out of work r/t bp drops    Past obstetric history: OB History    Grav Para Term Preterm Abortions TAB SAB Ect Mult Living   3 1 1  1  1   1      # Outc Date GA Lbr Len/2nd Wgt Sex Del Anes PTL Lv   1 TRM      SVD      2 SAB            3 CUR               Past Surgical History: Past Surgical History  Procedure Date  . Wisdom tooth extraction     Family History: Family History  Problem Relation Age of Onset  . Hypertension Mother   . Diabetes Maternal Grandmother   . Cancer Maternal Grandfather   . Anesthesia problems Neg Hx   . Hypotension Neg Hx   . Pseudochol deficiency Neg Hx   . Malignant hyperthermia Neg Hx     Social History: History  Substance Use Topics  . Smoking status: Former Smoker -- 0.5 packs/day    Types: Cigarettes    Quit date: 05/09/2012  . Smokeless tobacco: Never Used  . Alcohol Use: No     Comment: occasional    Allergies:  Allergies  Allergen Reactions  . Amoxicillin Rash  . Prednisone Rash    Meds:  Prescriptions prior to admission  Medication Sig Dispense Refill  . albuterol  (PROVENTIL HFA;VENTOLIN HFA) 108 (90 BASE) MCG/ACT inhaler Inhale 2 puffs into the lungs every 6 (six) hours as needed. SOB      . guaifenesin (ROBITUSSIN) 100 MG/5ML syrup Take 400 mg by mouth daily as needed. For cold symptoms      . Prenatal Vit-Fe Fumarate-FA (PRENATAL MULTIVITAMIN) TABS Take 1 tablet by mouth every morning.        ROS: Pertinent findings in history of present illness.  Physical Exam  Blood pressure 110/71, pulse 130, temperature 98.7 F (37.1 C), temperature source Oral, resp. rate 18, height 5\' 6"  (1.676 m), weight 103.42 kg (228 lb), last menstrual period 03/22/2012, SpO2 100.00%. GENERAL: Well-developed, well-nourished female in no acute distress.  HEENT: normocephalic HEART: normal rate RESP: normal effort ABDOMEN: Soft, non-tender, gravid appropriate for gestational age EXTREMITIES: Nontender, no edema NEURO: alert and oriented SPECULUM EXAM: deferred Dilation: 3 Effacement (%): 60 Station: -3 Exam by:: Ivonne Andrew, CNM  FHT:  Baseline 130 , moderate variability, accelerations present, no decelerations Contractions: q 7-10 mins, mild  Labs: Results for orders placed during the hospital  encounter of 12/04/12 (from the past 24 hour(s))  URINALYSIS, ROUTINE W REFLEX MICROSCOPIC     Status: Abnormal   Collection Time   12/04/12  9:00 AM      Component Value Range   Color, Urine YELLOW  YELLOW   APPearance HAZY (*) CLEAR   Specific Gravity, Urine 1.015  1.005 - 1.030   pH 7.0  5.0 - 8.0   Glucose, UA NEGATIVE  NEGATIVE mg/dL   Hgb urine dipstick NEGATIVE  NEGATIVE   Bilirubin Urine NEGATIVE  NEGATIVE   Ketones, ur NEGATIVE  NEGATIVE mg/dL   Protein, ur NEGATIVE  NEGATIVE mg/dL   Urobilinogen, UA 0.2  0.0 - 1.0 mg/dL   Nitrite NEGATIVE  NEGATIVE   Leukocytes, UA NEGATIVE  NEGATIVE    Imaging:  No results found. MAU Course: No cervical change after 1 hour. UC's decreased w/ PO fluids.   Assessment: 1. Preterm contractions     Plan: Discharge  home Preterm labor precautions and fetal kick counts Increase rest and fluids.     Follow-up Information    Follow up with [facility] On 12/13/2012.   Contact information:   [address] [place] [address] [phone]      Follow up with THE [facility] [place] MATERNITY ADMISSIONS. (As needed if symptoms worsen)    Contact information:   [address] [id number] mc [place] [address] [phone]          Medication List     As of 12/04/2012 11:38 PM    TAKE these medications         albuterol 108 (90 BASE) MCG/ACT inhaler   Commonly known as: PROVENTIL HFA;VENTOLIN HFA   Inhale 2 puffs into the lungs every 6 (six) hours as needed. SOB      guaifenesin 100 MG/5ML syrup   Commonly known as: ROBITUSSIN   Take 400 mg by mouth daily as needed. For cold symptoms      prenatal multivitamin Tabs   Take 1 tablet by mouth every morning.        [place], [place] 12/04/2012 11:38 PM

## 2012-12-04 NOTE — MAU Note (Signed)
PT SAYS SHE WAS HERE  AT 0900-  DX PTL- WAS  3 CM- GAVE PROCARDIA-  SENT HOME.  SAYS NOW UC  ARE 2-3 MIN APART  .   DENIES HSV AND MRSA.

## 2012-12-12 LAB — OB RESULTS CONSOLE GBS: GBS: NEGATIVE

## 2012-12-21 ENCOUNTER — Encounter (HOSPITAL_COMMUNITY): Payer: Self-pay | Admitting: *Deleted

## 2012-12-21 ENCOUNTER — Inpatient Hospital Stay (HOSPITAL_COMMUNITY)
Admission: AD | Admit: 2012-12-21 | Discharge: 2012-12-22 | Disposition: A | Discharge status: Home / Self Care | Payer: Medicaid Other | Source: Ambulatory Visit | Attending: Obstetrics & Gynecology | Admitting: Obstetrics & Gynecology

## 2012-12-21 DIAGNOSIS — R109 Unspecified abdominal pain: Secondary | ICD-10-CM | POA: Insufficient documentation

## 2012-12-21 DIAGNOSIS — O4690 Antepartum hemorrhage, unspecified, unspecified trimester: Secondary | ICD-10-CM | POA: Insufficient documentation

## 2012-12-21 NOTE — MAU Note (Signed)
Since 1630 I've been having abd cramping, a lot of pressure and some pink d/c when I wipe. I did have few small clots

## 2012-12-21 NOTE — MAU Note (Signed)
STARTED HURT BAD AT 530PM-  THEN WORSE AT 930PM.  VAG BLEEDING STARTED AT 930PM-    VE - HERE ON 1-30   3 CM.    DENIES HSV AND MRSA.

## 2012-12-22 ENCOUNTER — Inpatient Hospital Stay (HOSPITAL_COMMUNITY): Payer: Medicaid Other | Admitting: Anesthesiology

## 2012-12-22 ENCOUNTER — Encounter (HOSPITAL_COMMUNITY): Payer: Self-pay | Admitting: *Deleted

## 2012-12-22 ENCOUNTER — Inpatient Hospital Stay (HOSPITAL_COMMUNITY)
Admission: AD | Admit: 2012-12-22 | Discharge: 2012-12-24 | DRG: 775 | Disposition: A | Discharge status: Home / Self Care | Payer: Medicaid Other | Source: Ambulatory Visit | Attending: Obstetrics & Gynecology | Admitting: Obstetrics & Gynecology

## 2012-12-22 ENCOUNTER — Encounter (HOSPITAL_COMMUNITY): Payer: Self-pay | Admitting: Anesthesiology

## 2012-12-22 LAB — CBC
HCT: 36.6 % (ref 36.0–46.0)
Hemoglobin: 12.3 g/dL (ref 12.0–15.0)
MCH: 29.9 pg (ref 26.0–34.0)
MCHC: 33.6 g/dL (ref 30.0–36.0)
MCV: 89.1 fL (ref 78.0–100.0)
Platelets: 167 10*3/uL (ref 150–400)
RBC: 4.11 MIL/uL (ref 3.87–5.11)
RDW: 14.5 % (ref 11.5–15.5)
WBC: 9.6 10*3/uL (ref 4.0–10.5)

## 2012-12-22 LAB — TYPE AND SCREEN
ABO/RH(D): O POS
Antibody Screen: NEGATIVE

## 2012-12-22 LAB — RPR: RPR Ser Ql: NONREACTIVE

## 2012-12-22 MED ORDER — SIMETHICONE 80 MG PO CHEW: 80.0000 mg | CHEWABLE_TABLET | ORAL | Status: DC | PRN

## 2012-12-22 MED ORDER — OXYCODONE-ACETAMINOPHEN 5-325 MG PO TABS
1.0000 | ORAL_TABLET | ORAL | Status: DC | PRN
  Administered 2012-12-23: 2 via ORAL
  Administered 2012-12-23 (×2): 1 via ORAL
  Administered 2012-12-23 – 2012-12-24 (×2): 2 via ORAL
  Filled 2012-12-22 (×2): qty 2

## 2012-12-22 MED ORDER — PROMETHAZINE HCL 25 MG/ML IJ SOLN: 12.5000 mg | Freq: Four times a day (QID) | INTRAMUSCULAR | Status: DC | PRN

## 2012-12-22 MED ORDER — ACETAMINOPHEN 325 MG PO TABS: 650.0000 mg | ORAL_TABLET | ORAL | Status: DC | PRN

## 2012-12-22 MED ORDER — FLEET ENEMA 7-19 GM/118ML RE ENEM: 1.0000 | ENEMA | RECTAL | Status: DC | PRN

## 2012-12-22 MED ORDER — OXYTOCIN 40 UNITS IN LACTATED RINGERS INFUSION - SIMPLE MED
62.5000 mL/h | INTRAVENOUS | Status: DC
  Administered 2012-12-22: 500 mL/h via INTRAVENOUS
  Filled 2012-12-22: qty 1000

## 2012-12-22 MED ORDER — IBUPROFEN 600 MG PO TABS
600.0000 mg | ORAL_TABLET | Freq: Four times a day (QID) | ORAL | Status: DC | PRN
  Filled 2012-12-22 (×7): qty 1

## 2012-12-22 MED ORDER — IBUPROFEN 600 MG PO TABS
600.0000 mg | ORAL_TABLET | Freq: Four times a day (QID) | ORAL | Status: DC
  Administered 2012-12-22 – 2012-12-24 (×7): 600 mg via ORAL
  Filled 2012-12-22: qty 1

## 2012-12-22 MED ORDER — PHENYLEPHRINE 40 MCG/ML (10ML) SYRINGE FOR IV PUSH (FOR BLOOD PRESSURE SUPPORT)
80.0000 ug | PREFILLED_SYRINGE | INTRAVENOUS | Status: DC | PRN
  Filled 2012-12-22: qty 5

## 2012-12-22 MED ORDER — CITRIC ACID-SODIUM CITRATE 334-500 MG/5ML PO SOLN: 30.0000 mL | ORAL | Status: DC | PRN

## 2012-12-22 MED ORDER — BENZOCAINE-MENTHOL 20-0.5 % EX AERO
1.0000 "application " | INHALATION_SPRAY | CUTANEOUS | Status: DC | PRN
  Filled 2012-12-22: qty 56

## 2012-12-22 MED ORDER — LIDOCAINE HCL (PF) 1 % IJ SOLN
30.0000 mL | INTRAMUSCULAR | Status: DC | PRN
  Administered 2012-12-22: 30 mL via SUBCUTANEOUS
  Filled 2012-12-22 (×2): qty 30

## 2012-12-22 MED ORDER — EPHEDRINE 5 MG/ML INJ
10.0000 mg | INTRAVENOUS | Status: DC | PRN
  Filled 2012-12-22: qty 4

## 2012-12-22 MED ORDER — ONDANSETRON HCL 4 MG/2ML IJ SOLN: 4.0000 mg | Freq: Four times a day (QID) | INTRAMUSCULAR | Status: DC | PRN

## 2012-12-22 MED ORDER — ONDANSETRON HCL 4 MG PO TABS: 4.0000 mg | ORAL_TABLET | ORAL | Status: DC | PRN

## 2012-12-22 MED ORDER — DIBUCAINE 1 % RE OINT: 1.0000 "application " | TOPICAL_OINTMENT | RECTAL | Status: DC | PRN

## 2012-12-22 MED ORDER — TETANUS-DIPHTH-ACELL PERTUSSIS 5-2.5-18.5 LF-MCG/0.5 IM SUSP
0.5000 mL | Freq: Once | INTRAMUSCULAR | Status: AC
  Administered 2012-12-24: 0.5 mL via INTRAMUSCULAR
  Filled 2012-12-22: qty 0.5

## 2012-12-22 MED ORDER — LACTATED RINGERS IV SOLN: 500.0000 mL | INTRAVENOUS | Status: DC | PRN

## 2012-12-22 MED ORDER — LANOLIN HYDROUS EX OINT: TOPICAL_OINTMENT | CUTANEOUS | Status: DC | PRN

## 2012-12-22 MED ORDER — WITCH HAZEL-GLYCERIN EX PADS: 1.0000 "application " | MEDICATED_PAD | CUTANEOUS | Status: DC | PRN

## 2012-12-22 MED ORDER — BUTORPHANOL TARTRATE 1 MG/ML IJ SOLN: 1.0000 mg | INTRAMUSCULAR | Status: DC | PRN

## 2012-12-22 MED ORDER — LIDOCAINE HCL (PF) 1 % IJ SOLN
INTRAMUSCULAR | Status: DC | PRN
  Administered 2012-12-22 (×2): 5 mL

## 2012-12-22 MED ORDER — PHENYLEPHRINE 40 MCG/ML (10ML) SYRINGE FOR IV PUSH (FOR BLOOD PRESSURE SUPPORT): 80.0000 ug | PREFILLED_SYRINGE | INTRAVENOUS | Status: DC | PRN

## 2012-12-22 MED ORDER — OXYCODONE-ACETAMINOPHEN 5-325 MG PO TABS
1.0000 | ORAL_TABLET | ORAL | Status: DC | PRN
  Filled 2012-12-22: qty 2
  Filled 2012-12-22 (×2): qty 1

## 2012-12-22 MED ORDER — OXYTOCIN BOLUS FROM INFUSION: 500.0000 mL | INTRAVENOUS | Status: DC

## 2012-12-22 MED ORDER — DIPHENHYDRAMINE HCL 25 MG PO CAPS: 25.0000 mg | ORAL_CAPSULE | Freq: Four times a day (QID) | ORAL | Status: DC | PRN

## 2012-12-22 MED ORDER — EPHEDRINE 5 MG/ML INJ: 10.0000 mg | INTRAVENOUS | Status: DC | PRN

## 2012-12-22 MED ORDER — PRENATAL MULTIVITAMIN CH
1.0000 | ORAL_TABLET | Freq: Every day | ORAL | Status: DC
  Administered 2012-12-23 – 2012-12-24 (×2): 1 via ORAL
  Filled 2012-12-22 (×2): qty 1

## 2012-12-22 MED ORDER — ALBUTEROL SULFATE HFA 108 (90 BASE) MCG/ACT IN AERS
2.0000 | INHALATION_SPRAY | Freq: Four times a day (QID) | RESPIRATORY_TRACT | Status: DC | PRN
  Filled 2012-12-22: qty 6.7

## 2012-12-22 MED ORDER — INFLUENZA VIRUS VACC SPLIT PF IM SUSP
0.5000 mL | INTRAMUSCULAR | Status: AC
  Administered 2012-12-23: 0.5 mL via INTRAMUSCULAR
  Filled 2012-12-22: qty 0.5

## 2012-12-22 MED ORDER — FERROUS SULFATE 325 (65 FE) MG PO TABS
325.0000 mg | ORAL_TABLET | Freq: Two times a day (BID) | ORAL | Status: DC
  Administered 2012-12-23 – 2012-12-24 (×3): 325 mg via ORAL
  Filled 2012-12-22 (×3): qty 1

## 2012-12-22 MED ORDER — ONDANSETRON HCL 4 MG/2ML IJ SOLN: 4.0000 mg | INTRAMUSCULAR | Status: DC | PRN

## 2012-12-22 MED ORDER — LACTATED RINGERS IV SOLN
INTRAVENOUS | Status: DC
  Administered 2012-12-22 (×2): via INTRAVENOUS

## 2012-12-22 MED ORDER — DIPHENHYDRAMINE HCL 50 MG/ML IJ SOLN: 12.5000 mg | INTRAMUSCULAR | Status: DC | PRN

## 2012-12-22 MED ORDER — ZOLPIDEM TARTRATE 5 MG PO TABS: 5.0000 mg | ORAL_TABLET | Freq: Every evening | ORAL | Status: DC | PRN

## 2012-12-22 MED ORDER — SENNOSIDES-DOCUSATE SODIUM 8.6-50 MG PO TABS
2.0000 | ORAL_TABLET | Freq: Every day | ORAL | Status: DC
  Administered 2012-12-22 – 2012-12-23 (×2): 2 via ORAL

## 2012-12-22 MED ORDER — LACTATED RINGERS IV SOLN
500.0000 mL | Freq: Once | INTRAVENOUS | Status: AC
  Administered 2012-12-22: 500 mL via INTRAVENOUS

## 2012-12-22 MED ORDER — FENTANYL 2.5 MCG/ML BUPIVACAINE 1/10 % EPIDURAL INFUSION (WH - ANES)
14.0000 mL/h | INTRAMUSCULAR | Status: DC
  Administered 2012-12-22: 14 mL/h via EPIDURAL
  Filled 2012-12-22: qty 125

## 2012-12-22 NOTE — H&P (Signed)
This is Dr. Francoise Ceo dictating the history and physical on Victoria Wright she's a 22 year old gravida 3 para 1011 at 14 weeks and 2 days her due date is  2714 she was admitted in labor 5 cm 90% vertex -2 she's now 7 cm 100% -1 to -2 amniotomy was performed and the fluids clear Past medical history negative Past surgical history negative Social history negative System review negative Physical exam well-developed female in no distress HEENT negative Breasts negative Heart regular regular no murmurs no gallop lungs Lungs clear to P&A Abdomen term Pelvic as described above Extremities negative

## 2012-12-22 NOTE — MAU Note (Signed)
Pt was evaluated last night in MAU and states she was sent home at 3cms dilated. States contractions became worse through the night.

## 2012-12-22 NOTE — Anesthesia Procedure Notes (Signed)

## 2012-12-22 NOTE — Anesthesia Preprocedure Evaluation (Signed)
Anesthesia Evaluation  Patient identified by MRN, date of birth, ID band Patient awake    Reviewed: Allergy & Precautions, H&P , Patient's Chart, lab work & pertinent test results  Airway Mallampati: III TM Distance: >3 FB Neck ROM: full    Dental no notable dental hx.    Pulmonary neg pulmonary ROS,  breath sounds clear to auscultation  Pulmonary exam normal       Cardiovascular negative cardio ROS  Rhythm:regular Rate:Normal     Neuro/Psych negative neurological ROS  negative psych ROS   GI/Hepatic negative GI ROS, Neg liver ROS,   Endo/Other  negative endocrine ROS  Renal/GU negative Renal ROS     Musculoskeletal   Abdominal   Peds  Hematology negative hematology ROS (+)   Anesthesia Other Findings UTI (lower urinary tract infection)     Abnormal Pap smear   follow up was wnl    Acne     Hypotension   taken out of work r/t bp drops    Reproductive/Obstetrics (+) Pregnancy                           Anesthesia Physical Anesthesia Plan  ASA: II  Anesthesia Plan: Epidural   Post-op Pain Management:    Induction:   Airway Management Planned:   Additional Equipment:   Intra-op Plan:   Post-operative Plan:   Informed Consent: I have reviewed the patients History and Physical, chart, labs and discussed the procedure including the risks, benefits and alternatives for the proposed anesthesia with the patient or authorized representative who has indicated his/her understanding and acceptance.     Plan Discussed with:   Anesthesia Plan Comments:         Anesthesia Quick Evaluation

## 2012-12-23 LAB — CBC
HCT: 30.2 % — ABNORMAL LOW (ref 36.0–46.0)
Hemoglobin: 9.9 g/dL — ABNORMAL LOW (ref 12.0–15.0)
MCH: 29.1 pg (ref 26.0–34.0)
MCHC: 32.8 g/dL (ref 30.0–36.0)
MCV: 88.8 fL (ref 78.0–100.0)
Platelets: 145 10*3/uL — ABNORMAL LOW (ref 150–400)
RBC: 3.4 MIL/uL — ABNORMAL LOW (ref 3.87–5.11)
RDW: 14.5 % (ref 11.5–15.5)
WBC: 14 10*3/uL — ABNORMAL HIGH (ref 4.0–10.5)

## 2012-12-23 NOTE — Progress Notes (Signed)
Patient ID: Victoria Wright, female   DOB: 1991-08-24, 22 y.o.   MRN: [id number] Postpartum day one Vital signs normal Fundus firm Lochia moderate Legs negative doing well

## 2012-12-23 NOTE — Anesthesia Postprocedure Evaluation (Signed)
  Anesthesia Post-op Note  Patient: Victoria Wright  Procedure(s) Performed: * No procedures listed *  Patient Location: PACU and Mother/Baby  Anesthesia Type:Epidural  Level of Consciousness: awake, alert  and oriented  Airway and Oxygen Therapy: Patient Spontanous Breathing  Post-op Pain: mild  Post-op Assessment: Patient's Cardiovascular Status Stable, Respiratory Function Stable, No signs of Nausea or vomiting, Adequate PO intake, Pain level controlled, No headache, No backache, No residual numbness and No residual motor weakness  Post-op Vital Signs: stable  Complications: No apparent anesthesia complications

## 2012-12-24 MED ORDER — IBUPROFEN 600 MG PO TABS: 600.0000 mg | ORAL_TABLET | Freq: Four times a day (QID) | ORAL | Status: DC | PRN

## 2012-12-24 MED ORDER — OXYCODONE-ACETAMINOPHEN 5-325 MG PO TABS: 1.0000 | ORAL_TABLET | ORAL | Status: DC | PRN

## 2012-12-24 MED ORDER — FUSION PLUS PO CAPS: 1.0000 | ORAL_CAPSULE | Freq: Every day | ORAL | Status: DC

## 2012-12-24 NOTE — Progress Notes (Signed)
UR chart review completed.  

## 2012-12-24 NOTE — Progress Notes (Signed)
Post Partum Day 2 Subjective: no complaints  Objective: Blood pressure 101/46, pulse 84, temperature 99 F (37.2 C), temperature source Oral, resp. rate 18, height 5\' 6"  (1.676 m), weight 235 lb (106.595 kg), last menstrual period 03/22/2012, SpO2 98.00%, unknown if currently breastfeeding.  Physical Exam:  General: alert and no distress Lochia: appropriate Uterine Fundus: firm Incision: healing well DVT Evaluation: No evidence of DVT seen on physical exam.   Recent Labs  12/22/12 1315 12/23/12 0500  HGB 12.3 9.9*  HCT 36.6 30.2*    Assessment/Plan: Discharge home   LOS: 2 days   Natoshia Souter A 12/24/2012, 9:22 AM

## 2012-12-26 NOTE — Discharge Summary (Signed)
Obstetric Discharge Summary Reason for Admission: onset of labor Prenatal Procedures: ultrasound Intrapartum Procedures: spontaneous vaginal delivery Postpartum Procedures: none Complications-Operative and Postpartum: none Hemoglobin  Date Value Range Status  12/23/2012 9.9* 12.0 - 15.0 g/dL Final     REPEATED TO VERIFY     DELTA CHECK NOTED     HCT  Date Value Range Status  12/23/2012 30.2* 36.0 - 46.0 % Final    Physical Exam:  General: alert and no distress Lochia: appropriate Uterine Fundus: firm Incision: none DVT Evaluation: No evidence of DVT seen on physical exam.  Discharge Diagnoses: Term Pregnancy-delivered  Discharge Information: Date: 12/26/2012 Activity: pelvic rest Diet: routine Medications: PNV, Ibuprofen, Colace and Percocet Condition: stable Instructions: refer to practice specific booklet Discharge to: home Follow-up Information   Follow up with HARPER,CHARLES A, MD. Schedule an appointment as soon as possible for a visit in 6 weeks.   Contact information:   [address] [place] [address] [phone]       Newborn Data: Live born female  Birth Weight: 9 lb 7.9 oz (4305 g) APGAR: 9, 9  Home with mother.  HARPER,CHARLES A 12/26/2012, 10:55 AM

## 2013-02-04 ENCOUNTER — Ambulatory Visit (INDEPENDENT_AMBULATORY_CARE_PROVIDER_SITE_OTHER): Payer: Medicaid Other | Admitting: Obstetrics & Gynecology

## 2013-02-04 ENCOUNTER — Encounter: Payer: Self-pay | Admitting: Obstetrics & Gynecology

## 2013-02-04 VITALS — BP 120/69 | HR 78 | Temp 98.3°F | Ht 66.0 in | Wt 197.0 lb

## 2013-02-04 DIAGNOSIS — Z309 Encounter for contraceptive management, unspecified: Secondary | ICD-10-CM

## 2013-02-04 DIAGNOSIS — Z3202 Encounter for pregnancy test, result negative: Secondary | ICD-10-CM

## 2013-02-04 DIAGNOSIS — Z3483 Encounter for supervision of other normal pregnancy, third trimester: Secondary | ICD-10-CM

## 2013-02-04 LAB — POCT URINE PREGNANCY: Preg Test, Ur: NEGATIVE

## 2013-02-04 MED ORDER — NORETHIN ACE-ETH ESTRAD-FE 1-20 MG-MCG(24) PO TABS: 1.0000 | ORAL_TABLET | ORAL | Status: DC

## 2013-02-05 ENCOUNTER — Encounter: Payer: Self-pay | Admitting: Obstetrics & Gynecology

## 2013-02-05 ENCOUNTER — Telehealth: Payer: Self-pay | Admitting: *Deleted

## 2013-02-05 LAB — PAP IG AND CT-NG NAA
Chlamydia Probe Amp: NEGATIVE
GC Probe Amp: NEGATIVE

## 2013-02-05 LAB — VARICELLA ZOSTER ANTIBODY, IGG: Varicella IgG: 30.67 Index (ref <135.00)

## 2013-02-05 NOTE — Patient Instructions (Addendum)
Contraception Choices  Contraception (birth control) is the use of any methods or devices to prevent pregnancy. Below are some methods to help avoid pregnancy.  HORMONAL METHODS   · Contraceptive implant. This is a thin, plastic tube containing progesterone hormone. It does not contain estrogen hormone. Your caregiver inserts the tube in the inner part of the upper arm. The tube can remain in place for up to 3 years. After 3 years, the implant must be removed. The implant prevents the ovaries from releasing an egg (ovulation), thickens the cervical mucus which prevents sperm from entering the uterus, and thins the lining of the inside of the uterus.  · Progesterone-only injections. These injections are given every 3 months by your caregiver to prevent pregnancy. This synthetic progesterone hormone stops the ovaries from releasing eggs. It also thickens cervical mucus and changes the uterine lining. This makes it harder for sperm to survive in the uterus.  · Birth control pills. These pills contain estrogen and progesterone hormone. They work by stopping the egg from forming in the ovary (ovulation). Birth control pills are prescribed by a caregiver. Birth control pills can also be used to treat heavy periods.  · Minipill. This type of birth control pill contains only the progesterone hormone. They are taken every day of each month and must be prescribed by your caregiver.  · Birth control patch. The patch contains hormones similar to those in birth control pills. It must be changed once a week and is prescribed by a caregiver.  · Vaginal ring. The ring contains hormones similar to those in birth control pills. It is left in the vagina for 3 weeks, removed for 1 week, and then a new one is put back in place. The patient must be comfortable inserting and removing the ring from the vagina. A caregiver's prescription is necessary.  · Emergency contraception. Emergency contraceptives prevent pregnancy after unprotected  sexual intercourse. This pill can be taken right after sex or up to 5 days after unprotected sex. It is most effective the sooner you take the pills after having sexual intercourse. Emergency contraceptive pills are available without a prescription. Check with your pharmacist. Do not use emergency contraception as your only form of birth control.  BARRIER METHODS   · Female condom. This is a thin sheath (latex or rubber) that is worn over the penis during sexual intercourse. It can be used with spermicide to increase effectiveness.  · Female condom. This is a soft, loose-fitting sheath that is put into the vagina before sexual intercourse.  · Diaphragm. This is a soft, latex, dome-shaped barrier that must be fitted by a caregiver. It is inserted into the vagina, along with a spermicidal jelly. It is inserted before intercourse. The diaphragm should be left in the vagina for 6 to 8 hours after intercourse.  · Cervical cap. This is a round, soft, latex or plastic cup that fits over the cervix and must be fitted by a caregiver. The cap can be left in place for up to 48 hours after intercourse.  · Sponge. This is a soft, circular piece of polyurethane foam. The sponge has spermicide in it. It is inserted into the vagina after wetting it and before sexual intercourse.  · Spermicides. These are chemicals that kill or block sperm from entering the cervix and uterus. They come in the form of creams, jellies, suppositories, foam, or tablets. They do not require a prescription. They are inserted into the vagina with an applicator before having sexual intercourse.   The process must be repeated every time you have sexual intercourse.  INTRAUTERINE CONTRACEPTION  · Intrauterine device (IUD). This is a T-shaped device that is put in a woman's uterus during a menstrual period to prevent pregnancy. There are 2 types:  · Copper IUD. This type of IUD is wrapped in copper wire and is placed inside the uterus. Copper makes the uterus and  fallopian tubes produce a fluid that kills sperm. It can stay in place for 10 years.  · Hormone IUD. This type of IUD contains the hormone progestin (synthetic progesterone). The hormone thickens the cervical mucus and prevents sperm from entering the uterus, and it also thins the uterine lining to prevent implantation of a fertilized egg. The hormone can weaken or kill the sperm that get into the uterus. It can stay in place for 5 years.  PERMANENT METHODS OF CONTRACEPTION  · Female tubal ligation. This is when the woman's fallopian tubes are surgically sealed, tied, or blocked to prevent the egg from traveling to the uterus.  · Female sterilization. This is when the female has the tubes that carry sperm tied off (vasectomy). This blocks sperm from entering the vagina during sexual intercourse. After the procedure, the man can still ejaculate fluid (semen).  NATURAL PLANNING METHODS  · Natural family planning. This is not having sexual intercourse or using a barrier method (condom, diaphragm, cervical cap) on days the woman could become pregnant.  · Calendar method. This is keeping track of the length of each menstrual cycle and identifying when you are fertile.  · Ovulation method. This is avoiding sexual intercourse during ovulation.  · Symptothermal method. This is avoiding sexual intercourse during ovulation, using a thermometer and ovulation symptoms.  · Post-ovulation method. This is timing sexual intercourse after you have ovulated.  Regardless of which type or method of contraception you choose, it is important that you use condoms to protect against the transmission of sexually transmitted diseases (STDs). Talk with your caregiver about which form of contraception is most appropriate for you.  Document Released: 10/24/2005 Document Revised: 01/16/2012 Document Reviewed: 03/02/2011  [employer]® Patient Information ©2013 [employer].

## 2013-02-05 NOTE — Progress Notes (Signed)
Subjective:     Victoria Wright is a 22 y.o. female who presents for a postpartum visit.  I have fully reviewed the prenatal and intrapartum course.Outcome: spontaneous vaginal delivery.  Postpartum course has been uncomplicated. Baby's course has been normal. Bleeding no bleeding. Bowel function is normal. Bladder function is normal.  Contraception method is none.  The following portions of the patient's history were reviewed and updated as appropriate: allergies, current medications, past family history, past medical history, past social history, past surgical history and problem list.  Review of Systems Pertinent items are noted in HPI.   Objective:    BP 120/69  Pulse 78  Temp(Src) 98.3 F (36.8 C) (Oral)  Ht 5\' 6"  (1.676 m)  Wt 89.359 kg (197 lb)  BMI 31.81 kg/m2  Breastfeeding? No  General:  alert   Breasts:  not performed  Lungs: not performed  Heart:  not formed  Abdomen: soft, non-tender; bowel sounds normal; no masses,  no organomegaly   Vulva:  normal  Vagina: normal vagina  Cervix:  multiparous appearance  Corpus: normal size, contour, position, consistency, mobility, non-tender  Adnexa:  normal adnexa  Rectal Exam: not performed        Assessment:     Normal postpartum exam. Pap smear done at today's visit.   Plan:    1. Contraception: OCP (estrogen/progesterone)  2. Follow up  as needed.

## 2013-02-05 NOTE — Telephone Encounter (Signed)
Patient calling for lab results from yesterday (02/04/13).   Advised her that results are not back yet and we will call her if anything is abnormal.  Pt verbalized understanding.

## 2013-04-25 ENCOUNTER — Inpatient Hospital Stay (HOSPITAL_COMMUNITY)
Admission: AD | Admit: 2013-04-25 | Discharge: 2013-04-25 | Disposition: A | Discharge status: Home / Self Care | Payer: Medicaid Other | Source: Ambulatory Visit | Attending: Obstetrics | Admitting: Obstetrics

## 2013-04-25 ENCOUNTER — Encounter (HOSPITAL_COMMUNITY): Payer: Self-pay | Admitting: *Deleted

## 2013-04-25 DIAGNOSIS — R7881 Bacteremia: Secondary | ICD-10-CM | POA: Insufficient documentation

## 2013-04-25 DIAGNOSIS — R300 Dysuria: Secondary | ICD-10-CM | POA: Insufficient documentation

## 2013-04-25 LAB — URINALYSIS, ROUTINE W REFLEX MICROSCOPIC
Bilirubin Urine: NEGATIVE
Glucose, UA: NEGATIVE mg/dL
Hgb urine dipstick: NEGATIVE
Ketones, ur: NEGATIVE mg/dL
Leukocytes, UA: NEGATIVE
Nitrite: NEGATIVE
Protein, ur: 30 mg/dL — AB
Specific Gravity, Urine: 1.02 (ref 1.005–1.030)
Urobilinogen, UA: 0.2 mg/dL (ref 0.0–1.0)
pH: 7.5 (ref 5.0–8.0)

## 2013-04-25 LAB — URINE MICROSCOPIC-ADD ON

## 2013-04-25 LAB — POCT PREGNANCY, URINE: Preg Test, Ur: NEGATIVE

## 2013-04-25 MED ORDER — NITROFURANTOIN MONOHYD MACRO 100 MG PO CAPS: 100.0000 mg | ORAL_CAPSULE | Freq: Two times a day (BID) | ORAL | Status: DC

## 2013-04-25 NOTE — MAU Provider Note (Signed)
History     CSN: [id number]  Arrival date and time: 04/25/13 1102   None     Chief Complaint  Patient presents with  . Urinary Frequency  . Dysuria  . Hematuria   HPI Victoria Wright is 22 y.o. [id number] presents with dysuria X 2 day and hematuria this am.  Has frequency.  Denies fever and chills.  Hx of UTI, "this is how they begin".  Has not been sexually active  not concerned about STI's.  Patient of Dr. Verdell Carmine.  She called the office today and told they did not have an opening this week.  They said they would call her Rx in but they didn't ask her pharmacy so she came here.  She reports that she ended a pack of OCPs, didn't take the placebo pills and on the next day inserted a Nuvaring.  She saw some pink blood while in MAU.    Past Medical History  Diagnosis Date  . UTI (lower urinary tract infection)   . Abnormal Pap smear     follow up was wnl  . Acne   . Hypotension     taken out of work r/t bp drops    Past Surgical History  Procedure Laterality Date  . Wisdom tooth extraction      Family History  Problem Relation Age of Onset  . Hypertension Mother   . Diabetes Maternal Grandmother   . Cancer Maternal Grandfather   . Anesthesia problems Neg Hx   . Hypotension Neg Hx   . Pseudochol deficiency Neg Hx   . Malignant hyperthermia Neg Hx     History  Substance Use Topics  . Smoking status: Current Every Day Smoker -- 0.25 packs/day    Types: Cigarettes  . Smokeless tobacco: Never Used  . Alcohol Use: No    Allergies:  Allergies  Allergen Reactions  . Amoxicillin Rash  . Prednisone Rash    Prescriptions prior to admission  Medication Sig Dispense Refill  . albuterol (PROVENTIL HFA;VENTOLIN HFA) 108 (90 BASE) MCG/ACT inhaler Inhale 2 puffs into the lungs every 6 (six) hours as needed. SOB      . ibuprofen (ADVIL,MOTRIN) 600 MG tablet Take 1 tablet (600 mg total) by mouth every 6 (six) hours as needed for pain (pain scale < 4).  30 tablet  5  . Iron-FA-B  Cmp-C-Biot-Probiotic (FUSION PLUS) CAPS Take 1 capsule by mouth daily before breakfast.  30 capsule  5  . Norethindrone Acetate-Ethinyl Estrad-FE (LOESTRIN 24 FE) 1-20 MG-MCG(24) tablet Take 1 tablet by mouth 1 day or 1 dose.  2 Package  5  . oxyCODONE-acetaminophen (PERCOCET/ROXICET) 5-325 MG per tablet Take 1-2 tablets by mouth every 4 (four) hours as needed.  40 tablet  0    Review of Systems  Constitutional: Negative for fever and chills.  Genitourinary: Positive for dysuria, frequency and hematuria.       Neg for abnormal vaginal bleeding unless she missed OCP   Physical Exam   Blood pressure 125/65, pulse 76, temperature 99.4 F (37.4 C), temperature source Oral, resp. rate 16, height 5\' 6"  (1.676 m), weight 84.188 kg (185 lb 9.6 oz), last menstrual period 02/25/2013, SpO2 99.00%.  Physical Exam  Constitutional: She is oriented to person, place, and time. She appears well-developed and well-nourished. No distress.  HENT:  Head: Normocephalic.  Neck: Normal range of motion.  Cardiovascular: Normal rate.   Respiratory: Effort normal.  GI: Soft. She exhibits no distension and no mass. There is  tenderness (suprapubic). There is no rebound and no guarding.  Genitourinary: There is no rash, tenderness or lesion on the right labia. There is no rash, tenderness or lesion on the left labia. Uterus is not enlarged and not tender. Cervix exhibits no friability. Right adnexum displays no mass, no tenderness and no fullness. Left adnexum displays no mass, no tenderness and no fullness. There is bleeding (scant pink on foxswab) around the vagina. No tenderness around the vagina. No vaginal discharge found.  Neurological: She is alert and oriented to person, place, and time.  Skin: Skin is warm and dry.  Psychiatric: She has a normal mood and affect. Her behavior is normal.    MAU Course  Procedures Results for orders placed during the hospital encounter of 04/25/13 (from the past 24 hour(s))   URINALYSIS, ROUTINE W REFLEX MICROSCOPIC     Status: Abnormal   Collection Time    04/25/13 11:30 AM      Result Value Range   Color, Urine YELLOW  YELLOW   APPearance HAZY (*) CLEAR   Specific Gravity, Urine 1.020  1.005 - 1.030   pH 7.5  5.0 - 8.0   Glucose, UA NEGATIVE  NEGATIVE mg/dL   Hgb urine dipstick NEGATIVE  NEGATIVE   Bilirubin Urine NEGATIVE  NEGATIVE   Ketones, ur NEGATIVE  NEGATIVE mg/dL   Protein, ur 30 (*) NEGATIVE mg/dL   Urobilinogen, UA 0.2  0.0 - 1.0 mg/dL   Nitrite NEGATIVE  NEGATIVE   Leukocytes, UA NEGATIVE  NEGATIVE  URINE MICROSCOPIC-ADD ON     Status: Abnormal   Collection Time    04/25/13 11:30 AM      Result Value Range   Squamous Epithelial / LPF FEW (*) RARE   WBC, UA 7-10  <3 WBC/hpf   RBC / HPF 0-2  <3 RBC/hpf   Bacteria, UA MANY (*) RARE  POCT PREGNANCY, URINE     Status: None   Collection Time    04/25/13 11:48 AM      Result Value Range   Preg Test, Ur NEGATIVE  NEGATIVE    Assessment and Plan  A:  Bacteremia   P:  Rx for Macrobid 1 tab bid X 4 days      Increase po fluids      Follow up with Dr. Clearance Coots if sxs continue after treatment  LINEBERRY,SUSAN 04/25/2013, 12:25 PM

## 2013-04-25 NOTE — MAU Note (Signed)
Patient states she has had pain with urination for several days and has had blood in urine today.

## 2013-04-27 LAB — URINE CULTURE: Colony Count: >=100000

## 2013-05-01 ENCOUNTER — Ambulatory Visit: Payer: Medicaid Other | Admitting: Obstetrics & Gynecology

## 2013-05-03 ENCOUNTER — Ambulatory Visit: Payer: Medicaid Other | Admitting: Obstetrics & Gynecology

## 2013-06-17 ENCOUNTER — Inpatient Hospital Stay (HOSPITAL_COMMUNITY)
Admission: AD | Admit: 2013-06-17 | Discharge: 2013-06-17 | Disposition: A | Discharge status: Home / Self Care | Payer: Medicaid Other | Source: Ambulatory Visit | Attending: Obstetrics | Admitting: Obstetrics

## 2013-06-17 DIAGNOSIS — N949 Unspecified condition associated with female genital organs and menstrual cycle: Secondary | ICD-10-CM | POA: Insufficient documentation

## 2013-06-17 DIAGNOSIS — R300 Dysuria: Secondary | ICD-10-CM | POA: Insufficient documentation

## 2013-06-17 DIAGNOSIS — N390 Urinary tract infection, site not specified: Secondary | ICD-10-CM | POA: Insufficient documentation

## 2013-06-17 LAB — URINALYSIS, ROUTINE W REFLEX MICROSCOPIC
Bilirubin Urine: NEGATIVE
Glucose, UA: NEGATIVE mg/dL
Ketones, ur: NEGATIVE mg/dL
Nitrite: POSITIVE — AB
Protein, ur: 30 mg/dL — AB
Specific Gravity, Urine: >1.03 — ABNORMAL HIGH (ref 1.005–1.030)
Urobilinogen, UA: 0.2 mg/dL (ref 0.0–1.0)
pH: 6 (ref 5.0–8.0)

## 2013-06-17 LAB — URINE MICROSCOPIC-ADD ON

## 2013-06-17 LAB — POCT PREGNANCY, URINE: Preg Test, Ur: NEGATIVE

## 2013-06-17 MED ORDER — PHENAZOPYRIDINE HCL 200 MG PO TABS: 200.0000 mg | ORAL_TABLET | Freq: Three times a day (TID) | ORAL | Status: DC

## 2013-06-17 MED ORDER — CIPROFLOXACIN HCL 250 MG PO TABS: 250.0000 mg | ORAL_TABLET | Freq: Two times a day (BID) | ORAL | Status: DC

## 2013-06-17 NOTE — MAU Note (Signed)
Pt things she has a UTI. C/O burning with urinationa dnfrequency and pressure. Had UTI 2 months ago with same symptoms. Pt taking azo to help with symptoms.

## 2013-06-17 NOTE — Progress Notes (Signed)
Pt did just wanted to know if she had a UTI did not want to go into aroom. J.Ethier, PA talked woth pt and she gave her RX for UTI.

## 2013-06-17 NOTE — MAU Provider Note (Signed)
History     CSN: [id number]  Arrival date and time: 06/17/13 1533   None     Chief Complaint  Patient presents with  . Urinary Tract Infection   HPI Victoria Wright is a 22 y.o. [id number] who presents to MAU today with complaint of dysuria, urinary urgency and frequency. She states that symptoms started yesterday. She is having mild pelvic cramping. She has been taking AZO tabs with minimal relief. She denies fever or flank pain.   OB History   Grav Para Term Preterm Abortions TAB SAB Ect Mult Living   3 2 2  1  1   2       Past Medical History  Diagnosis Date  . UTI (lower urinary tract infection)   . Abnormal Pap smear     follow up was wnl  . Acne   . Hypotension     taken out of work r/t bp drops    Past Surgical History  Procedure Laterality Date  . Wisdom tooth extraction      Family History  Problem Relation Age of Onset  . Hypertension Mother   . Diabetes Maternal Grandmother   . Cancer Maternal Grandfather   . Anesthesia problems Neg Hx   . Hypotension Neg Hx   . Pseudochol deficiency Neg Hx   . Malignant hyperthermia Neg Hx     History  Substance Use Topics  . Smoking status: Current Every Day Smoker -- 0.50 packs/day    Types: Cigarettes  . Smokeless tobacco: Never Used  . Alcohol Use: No    Allergies:  Allergies  Allergen Reactions  . Amoxicillin Rash  . Prednisone Rash    Prescriptions prior to admission  Medication Sig Dispense Refill  . albuterol (PROVENTIL HFA;VENTOLIN HFA) 108 (90 BASE) MCG/ACT inhaler Inhale 2 puffs into the lungs every 6 (six) hours as needed. SOB      . ibuprofen (ADVIL,MOTRIN) 600 MG tablet Take 1 tablet (600 mg total) by mouth every 6 (six) hours as needed for pain (pain scale < 4).  30 tablet  5  . Iron-FA-B Cmp-C-Biot-Probiotic (FUSION PLUS) CAPS Take 1 capsule by mouth daily before breakfast.  30 capsule  5  . oxyCODONE-acetaminophen (PERCOCET/ROXICET) 5-325 MG per tablet Take 1-2 tablets by mouth every 4  (four) hours as needed.  40 tablet  0  . [DISCONTINUED] nitrofurantoin, macrocrystal-monohydrate, (MACROBID) 100 MG capsule Take 1 capsule (100 mg total) by mouth 2 (two) times daily.  8 capsule  0    Review of Systems  Constitutional: Negative for fever and malaise/fatigue.  Gastrointestinal: Positive for abdominal pain. Negative for nausea and vomiting.  Genitourinary: Positive for dysuria, urgency and frequency. Negative for hematuria and flank pain.   Physical Exam   Blood pressure 128/62, pulse 73, temperature 99.1 F (37.3 C), temperature source Oral, resp. rate 18, height 5\' 5"  (1.651 m), weight 186 lb 6.4 oz (84.55 kg), last menstrual period 06/02/2013.  Physical Exam  Constitutional: She is oriented to person, place, and time. She appears well-developed and well-nourished. No distress.  HENT:  Head: Normocephalic and atraumatic.  Cardiovascular: Normal rate.   Respiratory: Effort normal.  GI: Soft. There is no CVA tenderness.  Neurological: She is alert and oriented to person, place, and time.  Skin: Skin is warm and dry. No erythema.  Psychiatric: She has a normal mood and affect.   Results for orders placed during the hospital encounter of 06/17/13 (from the past 24 hour(s))  URINALYSIS, ROUTINE W  REFLEX MICROSCOPIC     Status: Abnormal   Collection Time    06/17/13  3:50 PM      Result Value Range   Color, Urine YELLOW  YELLOW   APPearance HAZY (*) CLEAR   Specific Gravity, Urine >1.030 (*) 1.005 - 1.030   pH 6.0  5.0 - 8.0   Glucose, UA NEGATIVE  NEGATIVE mg/dL   Hgb urine dipstick SMALL (*) NEGATIVE   Bilirubin Urine NEGATIVE  NEGATIVE   Ketones, ur NEGATIVE  NEGATIVE mg/dL   Protein, ur 30 (*) NEGATIVE mg/dL   Urobilinogen, UA 0.2  0.0 - 1.0 mg/dL   Nitrite POSITIVE (*) NEGATIVE   Leukocytes, UA SMALL (*) NEGATIVE  URINE MICROSCOPIC-ADD ON     Status: Abnormal   Collection Time    06/17/13  3:50 PM      Result Value Range   Squamous Epithelial / LPF FEW  (*) RARE   WBC, UA 21-50  <3 WBC/hpf   RBC / HPF 0-2  <3 RBC/hpf   Urine-Other MUCOUS PRESENT    POCT PREGNANCY, URINE     Status: None   Collection Time    06/17/13  4:06 PM      Result Value Range   Preg Test, Ur NEGATIVE  NEGATIVE    MAU Course  Procedures None  MDM UPT - negative UA today  Assessment and Plan  A: UTI  P: Discharge home Rx for Cipro and Pyridium sent to patient's pharmacy Patient advised to increase PO hydration as tolerated Patient advised of symptoms concerning for pyelonephritis and to return if she develops fever or flank pain Patient may return to MAU as needed or if her symptoms were to persist or worsen  Freddi Starr, PA-C  06/17/2013, 5:04 PM

## 2014-07-20 ENCOUNTER — Other Ambulatory Visit: Payer: Self-pay | Admitting: Obstetrics & Gynecology

## 2014-09-08 ENCOUNTER — Encounter (HOSPITAL_COMMUNITY): Payer: Self-pay | Admitting: *Deleted

## 2014-11-03 ENCOUNTER — Encounter: Payer: Self-pay | Admitting: *Deleted

## 2014-11-04 ENCOUNTER — Encounter: Payer: Self-pay | Admitting: Obstetrics & Gynecology

## 2014-12-16 ENCOUNTER — Other Ambulatory Visit: Payer: Self-pay | Admitting: Physician Assistant

## 2014-12-16 DIAGNOSIS — N63 Unspecified lump in unspecified breast: Secondary | ICD-10-CM

## 2014-12-22 ENCOUNTER — Other Ambulatory Visit: Payer: Medicaid Other

## 2014-12-29 ENCOUNTER — Other Ambulatory Visit: Payer: Medicaid Other

## 2014-12-29 ENCOUNTER — Ambulatory Visit
Admission: RE | Admit: 2014-12-29 | Discharge: 2014-12-29 | Disposition: A | Discharge status: Home / Self Care | Payer: Medicaid Other | Source: Ambulatory Visit | Attending: Physician Assistant | Admitting: Physician Assistant

## 2014-12-29 DIAGNOSIS — N63 Unspecified lump in unspecified breast: Secondary | ICD-10-CM

## 2015-02-18 ENCOUNTER — Inpatient Hospital Stay (HOSPITAL_COMMUNITY)
Admission: AD | Admit: 2015-02-18 | Discharge: 2015-02-18 | Disposition: A | Discharge status: Home / Self Care | Payer: Medicaid Other | Source: Ambulatory Visit | Attending: Obstetrics | Admitting: Obstetrics

## 2015-02-18 DIAGNOSIS — Z3201 Encounter for pregnancy test, result positive: Secondary | ICD-10-CM | POA: Diagnosis not present

## 2015-02-18 DIAGNOSIS — Z3490 Encounter for supervision of normal pregnancy, unspecified, unspecified trimester: Secondary | ICD-10-CM

## 2015-02-18 DIAGNOSIS — N926 Irregular menstruation, unspecified: Secondary | ICD-10-CM | POA: Insufficient documentation

## 2015-02-18 LAB — POCT PREGNANCY, URINE: Preg Test, Ur: POSITIVE — AB

## 2015-02-18 MED ORDER — PRENATAL PLUS 27-1 MG PO TABS: 1.0000 | ORAL_TABLET | Freq: Every day | ORAL | Status: DC

## 2015-02-18 NOTE — Discharge Instructions (Signed)
Prenatal Care  WHAT IS PRENATAL CARE?  Prenatal care means health care during your pregnancy, before your baby is born. It is very important to take care of yourself and your baby during your pregnancy by:   Getting early prenatal care. If you know you are pregnant, or think you might be pregnant, call your health care provider as soon as possible. Schedule a visit for a prenatal exam.  Getting regular prenatal care. Follow your health care provider's schedule for blood and other necessary tests. Do not miss appointments.  Doing everything you can to keep yourself and your baby healthy during your pregnancy.  Getting complete care. Prenatal care should include evaluation of the medical, dietary, educational, psychological, and social needs of you and your significant other. The medical and genetic history of your family and the family of your baby's father should be discussed with your health care provider.  Discussing with your health care provider:  Prescription, over-the-counter, and herbal medicines that you take.  Any history of substance abuse, alcohol use, smoking, and illegal drug use.  Any history of domestic abuse and violence.  Immunizations you have received.  Your nutrition and diet.  The amount of exercise you do.  Any environmental and occupational hazards to which you are exposed.  History of sexually transmitted infections for both you and your partner.  Previous pregnancies you have had. WHY IS PRENATAL CARE SO IMPORTANT?  By regularly seeing your health care provider, you help ensure that problems can be identified early so that they can be treated as soon as possible. Other problems might be prevented. Many studies have shown that early and regular prenatal care is important for the health of mothers and their babies.  HOW CAN I TAKE CARE OF MYSELF WHILE I AM PREGNANT?  Here are ways to take care of yourself and your baby:   Start or continue taking your  multivitamin with 400 micrograms (mcg) of folic acid every day.  Get early and regular prenatal care. It is very important to see a health care provider during your pregnancy. Your health care provider will check at each visit to make sure that you and your baby are healthy. If there are any problems, action can be taken right away to help you and your baby.  Eat a healthy diet that includes:  Fruits.  Vegetables.  Foods low in saturated fat.  Whole grains.  Calcium-rich foods, such as milk, yogurt, and hard cheeses.  Drink 6-8 glasses of liquids a day.  Unless your health care provider tells you not to, try to be physically active for 30 minutes, most days of the week. If you are pressed for time, you can get your activity in through 10-minute segments, three times a day.  Do not smoke, drink alcohol, or use drugs. These can cause long-term damage to your baby. Talk with your health care provider about steps to take to stop smoking. Talk with a member of your faith community, a counselor, a trusted friend, or your health care provider if you are concerned about your alcohol or drug use.  Ask your health care provider before taking any medicine, even over-the-counter medicines. Some medicines are not safe to take during pregnancy.  Get plenty of rest and sleep.  Avoid hot tubs and saunas during pregnancy.  Do not have X-rays taken unless absolutely necessary and with the recommendation of your health care provider. A lead shield can be placed on your abdomen to protect your baby when   X-rays are taken in other parts of your body.  Do not empty the cat litter when you are pregnant. It may contain a parasite that causes an infection called toxoplasmosis, which can cause birth defects. Also, use gloves when working in garden areas used by cats.  Do not eat uncooked or undercooked meats or fish.  Do not eat soft, mold-ripened cheeses (Brie, Camembert, and chevre) or soft, blue-veined  cheese (Danish blue and Roquefort).  Stay away from toxic chemicals like:  Insecticides.  Solvents (some cleaners or paint thinners).  Lead.  Mercury.  Sexual intercourse may continue until the end of the pregnancy, unless you have a medical problem or there is a problem with the pregnancy and your health care provider tells you not to.  Do not wear high-heel shoes, especially during the second half of the pregnancy. You can lose your balance and fall.  Do not take long trips, unless absolutely necessary. Be sure to see your health care provider before going on the trip.  Do not sit in one position for more than 2 hours when on a trip.  Take a copy of your medical records when going on a trip. Know where a hospital is located in the city you are visiting, in case of an emergency.  Most dangerous household products will have pregnancy warnings on their labels. Ask your health care provider about products if you are unsure.  Limit or eliminate your caffeine intake from coffee, tea, sodas, medicines, and chocolate.  Many women continue working through pregnancy. Staying active might help you stay healthier. If you have a question about the safety or the hours you work at your particular job, talk with your health care provider.  Get informed:  Read books.  Watch videos.  Go to childbirth classes for you and your significant other.  Talk with experienced moms.  Ask your health care provider about childbirth education classes for you and your partner. Classes can help you and your partner prepare for the birth of your baby.  Ask about a baby doctor (pediatrician) and methods and pain medicine for labor, delivery, and possible cesarean delivery. HOW OFTEN SHOULD I SEE MY HEALTH CARE PROVIDER DURING PREGNANCY?  Your health care provider will give you a schedule for your prenatal visits. You will have visits more often as you get closer to the end of your pregnancy. An average  pregnancy lasts about 40 weeks.  A typical schedule includes visiting your health care provider:   About once each month during your first 6 months of pregnancy.  Every 2 weeks during the next 2 months.  Weekly in the last month, until the delivery date. Your health care provider will probably want to see you more often if:  You are older than 35 years.  Your pregnancy is high risk because you have certain health problems or problems with the pregnancy, such as:  Diabetes.  High blood pressure.  The baby is not growing on schedule, according to the dates of the pregnancy. Your health care provider will do special tests to make sure you and your baby are not having any serious problems. WHAT HAPPENS DURING PRENATAL VISITS?   At your first prenatal visit, your health care provider will do a physical exam and talk to you about your health history and the health history of your partner and your family. Your health care provider will be able to tell you what date to expect your baby to be born on.  Your   first physical exam will include checks of your blood pressure, measurements of your height and weight, and an exam of your pelvic organs. Your health care provider will do a Pap test if you have not had one recently and will do cultures of your cervix to make sure there is no infection.  At each prenatal visit, there will be tests of your blood, urine, blood pressure, weight, and the progress of the baby will be checked.  At your later prenatal visits, your health care provider will check how you are doing and how your baby is developing. You may have a number of tests done as your pregnancy progresses.  Ultrasound exams are often used to check on your baby's growth and health.  You may have more urine and blood tests, as well as special tests, if needed. These may include amniocentesis to examine fluid in the pregnancy sac, stress tests to check how the baby responds to contractions, or a  biophysical profile to measure your baby's well-being. Your health care provider will explain the tests and why they are necessary.  You should be tested for high blood sugar (gestational diabetes) between the 24th and 28th weeks of your pregnancy.  You should discuss with your health care provider your plans to breastfeed or bottle-feed your baby.  Each visit is also a chance for you to learn about staying healthy during pregnancy and to ask questions. Document Released: 10/27/2003 Document Revised: 10/29/2013 Document Reviewed: 01/08/2014 ExitCare Patient Information 2015 [employer]. This information is not intended to replace advice given to you by your health care provider. Make sure you discuss any questions you have with your health care provider.  

## 2015-02-18 NOTE — MAU Provider Note (Signed)
CC: No chief complaint on file.      Subjective HPI Victoria Wright 23 y.o. [id number] at 7890w4d by unsure LMP 10/21/14. . GA uncertain also due to irregular menses, often skips 1-2 months presents for verification of pregnancy. l. Not using contraception and began having nausea today as well as breast soreness. HPT not done. Denies vaginal bleeding, abdominal pain or vomiting. No other concerns, just wants to know how far along she is.   Significant PMH: None.  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 rate 18, height 5\' 5"  (1.651 m), weight 192 lb 8 oz (87.317 kg), last menstrual period 10/21/2014, SpO2 100 %.  Physical Exam General: Mildly obese female in no apparent distress Abdomen: soft, nontender, unable to palpate fundus abdominally. Unable to obtain DT FHT  Lab Results Results for orders placed or performed during the hospital encounter of 02/18/15 (from the past 24 hour(s))  Pregnancy, urine POC     Status: Abnormal   Collection Time: 02/18/15  7:44 PM  Result Value Ref Range   Preg Test, Ur POSITIVE (A) NEGATIVE   MAU Course: Reassured that she is not "4 months" pregnant, probly first trimester  Assessment 24 y.o. [id number] pregnancy, unsure dates Irregular menses   Plan  Outpatient US for dating scheduled AVS on pregnancy precautions Pregnancy verification letter and eligibility information given List of prenatal care providers given   Medication List    STOP taking these medications        ciprofloxacin 250 MG tablet  Commonly known as:  CIPRO     FUSION PLUS Caps     ibuprofen 600 MG tablet  Commonly known as:  ADVIL,MOTRIN     NUVARING 0.12-0.015 MG/24HR vaginal ring  Generic drug:  etonogestrel-ethinyl estradiol     oxyCODONE-acetaminophen 5-325 MG per tablet  Commonly known as:  PERCOCET/ROXICET     phenazopyridine 200 MG tablet  Commonly known as:  PYRIDIUM      TAKE these medications        albuterol 108 (90 BASE) MCG/ACT inhaler  Commonly known as:  PROVENTIL HFA;VENTOLIN HFA  Inhale 2 puffs into the lungs every 6 (six) hours as needed. SOB     prenatal vitamin w/FE, FA 27-1 MG Tabs tablet  Take 1 tablet by mouth daily.         Follow-up Information    [facility] [place]   Why:  Choose pregnancy care provider from the list given   Contact information:   [address] [facility] [place] [id number] [phone]      Danae OrleansDeirdre C Claudette Wermuth, CNM 02/18/2015 7:57 PM

## 2015-02-25 ENCOUNTER — Other Ambulatory Visit: Payer: Self-pay | Admitting: Medical

## 2015-02-25 DIAGNOSIS — N926 Irregular menstruation, unspecified: Secondary | ICD-10-CM

## 2015-02-25 NOTE — Progress Notes (Signed)
Patient called MAU asking about US. Per patient note from 02/18/15 in MAU outpatient US for dating was supposed to be ordered. No appointment or order noted in patient chart today. Order placed and request made for radiology to call patient with appointment date/time.   Marny LowensteinJulie N Danyell Shader, PA-C  02/25/2015 1:57 AM

## 2015-02-27 ENCOUNTER — Inpatient Hospital Stay (HOSPITAL_COMMUNITY)
Admission: AD | Admit: 2015-02-27 | Discharge: 2015-02-27 | Disposition: A | Discharge status: Home / Self Care | Payer: Medicaid Other | Source: Ambulatory Visit | Attending: Family Medicine | Admitting: Family Medicine

## 2015-02-27 ENCOUNTER — Ambulatory Visit (HOSPITAL_COMMUNITY)
Admission: RE | Admit: 2015-02-27 | Discharge: 2015-02-27 | Disposition: A | Discharge status: Home / Self Care | Payer: Medicaid Other | Source: Ambulatory Visit | Attending: Medical | Admitting: Medical

## 2015-02-27 DIAGNOSIS — Z3201 Encounter for pregnancy test, result positive: Secondary | ICD-10-CM | POA: Diagnosis not present

## 2015-02-27 DIAGNOSIS — N926 Irregular menstruation, unspecified: Secondary | ICD-10-CM

## 2015-02-27 DIAGNOSIS — Z3491 Encounter for supervision of normal pregnancy, unspecified, first trimester: Secondary | ICD-10-CM

## 2015-02-27 NOTE — MAU Provider Note (Signed)
Chief Complaint: US result     SUBJECTIVE HPI: Victoria Wright is a 24 y.o. [phone] with irregular cycles and unsure LMP Who was seen 02/23/2015 for pregnancy verification and had outpatient ultrasound today for dating. She is concerned because she has had 1 SAB in the past. He denies any cramping or bleeding. She has arranged for prenatal care with Dr. Shawnie Ponsorn.  Past Medical History  Diagnosis Date  . UTI (lower urinary tract infection)   . Abnormal Pap smear     follow up was wnl  . Acne   . Hypotension     taken out of work r/t bp drops   OB History  Gravida Para Term Preterm AB SAB TAB Ectopic Multiple Living  3 2 2  1 1    2     # Outcome Date GA Lbr Len/2nd Weight Sex Delivery Anes PTL Lv  3 Term 12/22/12 6163w2d 09:12 / 00:19 9 lb 7.9 oz (4.305 kg) M Vag-Spont EPI  Y  2 Term      Vag-Spont     1 SAB              Past Surgical History  Procedure Laterality Date  . Wisdom tooth extraction     History   Social History  . Marital Status: Single    Spouse Name: N/A  . Number of Children: 2  . Years of Education: N/A   Occupational History  . CNA    Social History Main Topics  . Smoking status: Current Every Day Smoker -- 0.50 packs/day    Types: Cigarettes  . Smokeless tobacco: Never Used  . Alcohol Use: No  . Drug Use: No  . Sexual Activity: Yes    Birth Control/ Protection: None   Other Topics Concern  . Not on file   Social History Narrative   No current facility-administered medications on file prior to encounter.   Current Outpatient Prescriptions on File Prior to Encounter  Medication Sig Dispense Refill  . albuterol (PROVENTIL HFA;VENTOLIN HFA) 108 (90 BASE) MCG/ACT inhaler Inhale 2 puffs into the lungs every 6 (six) hours as needed. SOB    . prenatal vitamin w/FE, FA (PRENATAL 1 + 1) 27-1 MG TABS tablet Take 1 tablet by mouth daily. 30 each 0   Allergies  Allergen Reactions  . Amoxicillin Rash  . Prednisone Rash    Review of Systems   Constitutional: Negative for fever.  Gastrointestinal: Positive for nausea and vomiting.  Neurological: Negative for dizziness.    OBJECTIVE Last menstrual period 10/21/2014. GENERAL: Well-developed, well-nourished female in no acute distress.    IMAGING [place] Ob Comp Less 14 Wks  02/27/2015   CLINICAL DATA:  24 year old G4 P2 AB1, LMP approximately 01/06/2015 though the patient is unsure of her LMP as she has had irregular menses (EGA [redacted] weeks 3 days), with an episode of vaginal bleeding (spotting) approximately 2 weeks ago.  EXAM: OBSTETRIC <14 WK US AND TRANSVAGINAL OB US  TECHNIQUE: Both transabdominal and transvaginal ultrasound examinations were performed for complete evaluation of the gestation as well as the maternal uterus, adnexal regions, and pelvic cul-de-sac. Transvaginal technique was performed to assess early pregnancy.  COMPARISON:  None for this gestation.  FINDINGS: Intrauterine gestational sac: Single, triangular or heart shaped.  Yolk sac: Likely present, but the entire circumferential wall of the yolk sac is not visualized.  Embryo:  Not present.  Cardiac Activity: Not applicable.  MSD: 13.9 mm   6 w   2 d  [place] EDC: 10/21/2015.  Maternal uterus/adnexae: Moderate-sized subchorionic hemorrhage. Normal-appearing left ovary measuring approximately 4.9 x 3.1 x 5.2 cm, containing a corpus luteum cyst. Normal-appearing right ovary with multiple follicular cysts measuring approximately 3.2 x 2.1 x 2.8 cm. Small amount of free fluid in the cul-de-sac.  IMPRESSION: 1. Irregularly-shaped gestational sac containing a yolk sac (though the entire circumferential wall of the yolk sac was not visualized). No visible embryo currently. 2. Moderate-sized subchorionic hemorrhage. 3. Small amount of free fluid in the cul-de-sac. 4. Normal-appearing ovaries with a corpus luteum cyst in the left ovary. Followup ultrasound in 7-10 days may be helpful in further evaluation for determination of viability, as an  embryo should be present within 7-10 days after a yolk sac is visible. Serial quantitative beta HCG may also be helpful.   Electronically Signed   By: Hulan Saas M.D.   On: 02/27/2015 16:00   US Ob Transvaginal  02/27/2015   CLINICAL DATA:  24 year old G4 P2 AB1, LMP approximately 01/06/2015 though the patient is unsure of her LMP as she has had irregular menses (EGA [redacted] weeks 3 days), with an episode of vaginal bleeding (spotting) approximately 2 weeks ago.  EXAM: OBSTETRIC <14 WK [place] AND TRANSVAGINAL OB US  TECHNIQUE: Both transabdominal and transvaginal ultrasound examinations were performed for complete evaluation of the gestation as well as the maternal uterus, adnexal regions, and pelvic cul-de-sac. Transvaginal technique was performed to assess early pregnancy.  COMPARISON:  None for this gestation.  FINDINGS: Intrauterine gestational sac: Single, triangular or heart shaped.  Yolk sac: Likely present, but the entire circumferential wall of the yolk sac is not visualized.  Embryo:  Not present.  Cardiac Activity: Not applicable.  MSD: 13.9 mm   6 w   2 d  [place] EDC: 10/21/2015.  Maternal uterus/adnexae: Moderate-sized subchorionic hemorrhage. Normal-appearing left ovary measuring approximately 4.9 x 3.1 x 5.2 cm, containing a corpus luteum cyst. Normal-appearing right ovary with multiple follicular cysts measuring approximately 3.2 x 2.1 x 2.8 cm. Small amount of free fluid in the cul-de-sac.  IMPRESSION: 1. Irregularly-shaped gestational sac containing a yolk sac (though the entire circumferential wall of the yolk sac was not visualized). No visible embryo currently. 2. Moderate-sized subchorionic hemorrhage. 3. Small amount of free fluid in the cul-de-sac. 4. Normal-appearing ovaries with a corpus luteum cyst in the left ovary. Followup ultrasound in 7-10 days may be helpful in further evaluation for determination of viability, as an embryo should be present within 7-10 days after a yolk sac is visible.  Serial quantitative beta HCG may also be helpful.   Electronically Signed   By: Hulan Saas M.D.   On: 02/27/2015 16:00    MAU COURSE Discussed above with patient and partner. May have spotting or bleeding d/t [facility].   ASSESSMENT 1. Normal IUP (intrauterine pregnancy) on prenatal ultrasound, first trimester   Viability not determined  PLAN Discharge with pregnancy verification letter    Medication List    TAKE these medications        albuterol 108 (90 BASE) MCG/ACT inhaler  Commonly known as:  PROVENTIL HFA;VENTOLIN HFA  Inhale 2 puffs into the lungs every 6 (six) hours as needed. SOB     prenatal vitamin w/FE, FA 27-1 MG Tabs tablet  Take 1 tablet by mouth daily.       Follow-up Information    Follow up with Alm Bustard, MD On 03/11/2016.   Specialty:  Specialist   Why:  Keep your scheduled prenatal appointment  Contact information:   [address] [place] [address] [phone]       Danae Orleans, CNM 02/27/2015  4:36 PM

## 2015-02-27 NOTE — Discharge Instructions (Signed)
First Trimester of Pregnancy The first trimester of pregnancy is from week 1 until the end of week 12 (months 1 through 3). A week after a sperm fertilizes an egg, the egg will implant on the wall of the uterus. This embryo will begin to develop into a baby. Genes from you and your partner are forming the baby. The female genes determine whether the baby is a boy or a girl. At 6-8 weeks, the eyes and face are formed, and the heartbeat can be seen on ultrasound. At the end of 12 weeks, all the baby's organs are formed.  Now that you are pregnant, you will want to do everything you can to have a healthy baby. Two of the most important things are to get good prenatal care and to follow your health care provider's instructions. Prenatal care is all the medical care you receive before the baby's birth. This care will help prevent, find, and treat any problems during the pregnancy and childbirth. BODY CHANGES Your body goes through many changes during pregnancy. The changes vary from woman to woman.   You may gain or lose a couple of pounds at first.  You may feel sick to your stomach (nauseous) and throw up (vomit). If the vomiting is uncontrollable, call your health care provider.  You may tire easily.  You may develop headaches that can be relieved by medicines approved by your health care provider.  You may urinate more often. Painful urination may mean you have a bladder infection.  You may develop heartburn as a result of your pregnancy.  You may develop constipation because certain hormones are causing the muscles that push waste through your intestines to slow down.  You may develop hemorrhoids or swollen, bulging veins (varicose veins).  Your breasts may begin to grow larger and become tender. Your nipples may stick out more, and the tissue that surrounds them (areola) may become darker.  Your gums may bleed and may be sensitive to brushing and flossing.  Dark spots or blotches (chloasma,  mask of pregnancy) may develop on your face. This will likely fade after the baby is born.  Your menstrual periods will stop.  You may have a loss of appetite.  You may develop cravings for certain kinds of food.  You may have changes in your emotions from day to day, such as being excited to be pregnant or being concerned that something may go wrong with the pregnancy and baby.  You may have more vivid and strange dreams.  You may have changes in your hair. These can include thickening of your hair, rapid growth, and changes in texture. Some women also have hair loss during or after pregnancy, or hair that feels dry or thin. Your hair will most likely return to normal after your baby is born. WHAT TO EXPECT AT YOUR PRENATAL VISITS During a routine prenatal visit:  You will be weighed to make sure you and the baby are growing normally.  Your blood pressure will be taken.  Your abdomen will be measured to track your baby's growth.  The fetal heartbeat will be listened to starting around week 10 or 12 of your pregnancy.  Test results from any previous visits will be discussed. Your health care provider may ask you:  How you are feeling.  If you are feeling the baby move.  If you have had any abnormal symptoms, such as leaking fluid, bleeding, severe headaches, or abdominal cramping.  If you have any questions. Other tests   that may be performed during your first trimester include:  Blood tests to find your blood type and to check for the presence of any previous infections. They will also be used to check for low iron levels (anemia) and Rh antibodies. Later in the pregnancy, blood tests for diabetes will be done along with other tests if problems develop.  Urine tests to check for infections, diabetes, or protein in the urine.  An ultrasound to confirm the proper growth and development of the baby.  An amniocentesis to check for possible genetic problems.  Fetal screens for  spina bifida and Down syndrome.  You may need other tests to make sure you and the baby are doing well. HOME CARE INSTRUCTIONS  Medicines  Follow your health care provider's instructions regarding medicine use. Specific medicines may be either safe or unsafe to take during pregnancy.  Take your prenatal vitamins as directed.  If you develop constipation, try taking a stool softener if your health care provider approves. Diet  Eat regular, well-balanced meals. Choose a variety of foods, such as meat or vegetable-based protein, fish, milk and low-fat dairy products, vegetables, fruits, and whole grain breads and cereals. Your health care provider will help you determine the amount of weight gain that is right for you.  Avoid raw meat and uncooked cheese. These carry germs that can cause birth defects in the baby.  Eating four or five small meals rather than three large meals a day may help relieve nausea and vomiting. If you start to feel nauseous, eating a few soda crackers can be helpful. Drinking liquids between meals instead of during meals also seems to help nausea and vomiting.  If you develop constipation, eat more high-fiber foods, such as fresh vegetables or fruit and whole grains. Drink enough fluids to keep your urine clear or pale yellow. Activity and Exercise  Exercise only as directed by your health care provider. Exercising will help you:  Control your weight.  Stay in shape.  Be prepared for labor and delivery.  Experiencing pain or cramping in the lower abdomen or low back is a good sign that you should stop exercising. Check with your health care provider before continuing normal exercises.  Try to avoid standing for long periods of time. Move your legs often if you must stand in one place for a long time.  Avoid heavy lifting.  Wear low-heeled shoes, and practice good posture.  You may continue to have sex unless your health care provider directs you  otherwise. Relief of Pain or Discomfort  Wear a good support bra for breast tenderness.   Take warm sitz baths to soothe any pain or discomfort caused by hemorrhoids. Use hemorrhoid cream if your health care provider approves.   Rest with your legs elevated if you have leg cramps or low back pain.  If you develop varicose veins in your legs, wear support hose. Elevate your feet for 15 minutes, 3-4 times a day. Limit salt in your diet. Prenatal Care  Schedule your prenatal visits by the twelfth week of pregnancy. They are usually scheduled monthly at first, then more often in the last 2 months before delivery.  Write down your questions. Take them to your prenatal visits.  Keep all your prenatal visits as directed by your health care provider. Safety  Wear your seat belt at all times when driving.  Make a list of emergency phone numbers, including numbers for family, friends, the hospital, and police and fire departments. General Tips    Ask your health care provider for a referral to a local prenatal education class. Begin classes no later than at the beginning of month 6 of your pregnancy.  Ask for help if you have counseling or nutritional needs during pregnancy. Your health care provider can offer advice or refer you to specialists for help with various needs.  Do not use hot tubs, steam rooms, or saunas.  Do not douche or use tampons or scented sanitary pads.  Do not cross your legs for long periods of time.  Avoid cat litter boxes and soil used by cats. These carry germs that can cause birth defects in the baby and possibly loss of the fetus by miscarriage or stillbirth.  Avoid all smoking, herbs, alcohol, and medicines not prescribed by your health care provider. Chemicals in these affect the formation and growth of the baby.  Schedule a dentist appointment. At home, brush your teeth with a soft toothbrush and be gentle when you floss. SEEK MEDICAL CARE IF:   You have  dizziness.  You have mild pelvic cramps, pelvic pressure, or nagging pain in the abdominal area.  You have persistent nausea, vomiting, or diarrhea.  You have a bad smelling vaginal discharge.  You have pain with urination.  You notice increased swelling in your face, hands, legs, or ankles. SEEK IMMEDIATE MEDICAL CARE IF:   You have a fever.  You are leaking fluid from your vagina.  You have spotting or bleeding from your vagina.  You have severe abdominal cramping or pain.  You have rapid weight gain or loss.  You vomit blood or material that looks like coffee grounds.  You are exposed to German measles and have never had them.  You are exposed to fifth disease or chickenpox.  You develop a severe headache.  You have shortness of breath.  You have any kind of trauma, such as from a fall or a car accident. Document Released: 10/18/2001 Document Revised: 03/10/2014 Document Reviewed: 09/03/2013 ExitCare Patient Information 2015 [employer]. This information is not intended to replace advice given to you by your health care provider. Make sure you discuss any questions you have with your health care provider.  

## 2015-03-03 ENCOUNTER — Encounter (HOSPITAL_COMMUNITY): Payer: Self-pay | Admitting: *Deleted

## 2015-03-03 ENCOUNTER — Inpatient Hospital Stay (HOSPITAL_COMMUNITY): Payer: Medicaid Other

## 2015-03-03 ENCOUNTER — Inpatient Hospital Stay (HOSPITAL_COMMUNITY)
Admission: AD | Admit: 2015-03-03 | Discharge: 2015-03-03 | Disposition: A | Discharge status: Home / Self Care | Payer: Medicaid Other | Source: Ambulatory Visit | Attending: Family Medicine | Admitting: Family Medicine

## 2015-03-03 DIAGNOSIS — O26851 Spotting complicating pregnancy, first trimester: Secondary | ICD-10-CM | POA: Insufficient documentation

## 2015-03-03 DIAGNOSIS — F17210 Nicotine dependence, cigarettes, uncomplicated: Secondary | ICD-10-CM | POA: Diagnosis not present

## 2015-03-03 DIAGNOSIS — Z36 Encounter for antenatal screening of mother: Secondary | ICD-10-CM

## 2015-03-03 DIAGNOSIS — R109 Unspecified abdominal pain: Secondary | ICD-10-CM | POA: Insufficient documentation

## 2015-03-03 DIAGNOSIS — Z3A01 Less than 8 weeks gestation of pregnancy: Secondary | ICD-10-CM | POA: Diagnosis not present

## 2015-03-03 DIAGNOSIS — O99331 Smoking (tobacco) complicating pregnancy, first trimester: Secondary | ICD-10-CM | POA: Insufficient documentation

## 2015-03-03 DIAGNOSIS — Z8759 Personal history of other complications of pregnancy, childbirth and the puerperium: Secondary | ICD-10-CM

## 2015-03-03 DIAGNOSIS — O209 Hemorrhage in early pregnancy, unspecified: Secondary | ICD-10-CM

## 2015-03-03 DIAGNOSIS — O3680X0 Pregnancy with inconclusive fetal viability, not applicable or unspecified: Secondary | ICD-10-CM

## 2015-03-03 LAB — CBC
HCT: 33.9 % — ABNORMAL LOW (ref 36.0–46.0)
Hemoglobin: 11.6 g/dL — ABNORMAL LOW (ref 12.0–15.0)
MCH: 29.6 pg (ref 26.0–34.0)
MCHC: 34.2 g/dL (ref 30.0–36.0)
MCV: 86.5 fL (ref 78.0–100.0)
Platelets: 225 10*3/uL (ref 150–400)
RBC: 3.92 MIL/uL (ref 3.87–5.11)
RDW: 13 % (ref 11.5–15.5)
WBC: 6.5 10*3/uL (ref 4.0–10.5)

## 2015-03-03 LAB — HCG, QUANTITATIVE, PREGNANCY: hCG, Beta Chain, Quant, S: 33662 m[IU]/mL — ABNORMAL HIGH (ref <5)

## 2015-03-03 NOTE — Discharge Instructions (Signed)
Threatened Miscarriage °A threatened miscarriage is when you have vaginal bleeding during your first 20 weeks of pregnancy but the pregnancy has not ended. Your doctor will do tests to make sure you are still pregnant. The cause of the bleeding may not be known. This condition does not mean your pregnancy will end. It does increase the risk of it ending (complete miscarriage). °HOME CARE  °· Make sure you keep all your doctor visits for prenatal care. °· Get plenty of rest. °· Do not have sex or use tampons if you have vaginal bleeding. °· Do not douche. °· Do not smoke or use drugs. °· Do not drink alcohol. °· Avoid caffeine. °GET HELP IF: °· You have light bleeding from your vagina. °· You have belly pain or cramping. °· You have a fever. °GET HELP RIGHT AWAY IF:  °· You have heavy bleeding from your vagina. °· You have clots of blood coming from your vagina. °· You have bad pain or cramps in your low back or belly. °· You have fever, chills, and bad belly pain. °MAKE SURE YOU:  °· Understand these instructions. °· Will watch your condition. °· Will get help right away if you are not doing well or get worse. °Document Released: 10/06/2008 Document Revised: 10/29/2013 Document Reviewed: 08/20/2013 °ExitCare® Patient Information ©2015 [employer]. This information is not intended to replace advice given to you by your health care provider. Make sure you discuss any questions you have with your health care provider. ° °

## 2015-03-03 NOTE — MAU Note (Signed)
Pt had U/S today, was told baby had no heartbeat.  U/S was done @ [facility], pt states MD wants her to wait.   Started bleeding this morning, but has stopped now.  Lower abd cramping for the past 3 days.  Pt unhappy about waiting, very stressed, had D&C with prior pregnancy.

## 2015-03-03 NOTE — MAU Note (Signed)
Pt called, not in lobby 

## 2015-03-03 NOTE — MAU Provider Note (Signed)
Chief Complaint: Abdominal Pain and fetal demise    None     SUBJECTIVE HPI: Victoria Wright is a 24 y.o. [id number] at [redacted]w[redacted]d by LMP who presents to maternity admissions reporting vaginal spotting x 2 days. She reports cramping, intermittent abdominal pain x 2 days also. She was by her OB office in [facility] today and had an ultrasound with no FHR, no growth of gestational sac, and likely failed pregnancy but was told she needed a repeat U/S in 1 week. She came to MAU for a second opinion.  She denies heavy vaginal bleeding, vaginal itching/burning, urinary symptoms, h/a, dizziness, n/v, or fever/chills.     Abdominal Pain This is a new problem. The current episode started in the past 7 days. The onset quality is gradual. The problem occurs intermittently. The most recent episode lasted 2 days. The problem has been waxing and waning. The pain is located in the LLQ and RLQ. The pain is mild. The quality of the pain is cramping. The abdominal pain does not radiate. Pertinent negatives include no dysuria, fever, frequency, headaches, nausea or vomiting. Nothing aggravates the pain. She has tried nothing for the symptoms.  Vaginal Bleeding The patient's primary symptoms include vaginal bleeding. This is a new problem. The current episode started in the past 7 days. The problem occurs intermittently. The problem has been unchanged. The pain is mild. She is pregnant. Associated symptoms include abdominal pain. Pertinent negatives include no chills, dysuria, fever, frequency, headaches, nausea, urgency or vomiting. The vaginal bleeding is spotting. She has not been passing clots. She has not been passing tissue. The symptoms are aggravated by activity. She has tried nothing for the symptoms. No, her partner does not have an STD.    Past Medical History  Diagnosis Date  . UTI (lower urinary tract infection)   . Abnormal Pap smear     follow up was wnl  . Acne   . Hypotension     taken out of work r/t bp  drops   Past Surgical History  Procedure Laterality Date  . Wisdom tooth extraction     History   Social History  . Marital Status: Single    Spouse Name: N/A  . Number of Children: 2  . Years of Education: N/A   Occupational History  . CNA    Social History Main Topics  . Smoking status: Current Every Day Smoker -- 0.50 packs/day    Types: Cigarettes  . Smokeless tobacco: Never Used  . Alcohol Use: No  . Drug Use: No  . Sexual Activity: Yes    Birth Control/ Protection: None   Other Topics Concern  . Not on file   Social History Narrative   No current facility-administered medications on file prior to encounter.   Current Outpatient Prescriptions on File Prior to Encounter  Medication Sig Dispense Refill  . albuterol (PROVENTIL HFA;VENTOLIN HFA) 108 (90 BASE) MCG/ACT inhaler Inhale 2 puffs into the lungs every 6 (six) hours as needed. SOB    . prenatal vitamin w/FE, FA (PRENATAL 1 + 1) 27-1 MG TABS tablet Take 1 tablet by mouth daily. 30 each 0   Allergies  Allergen Reactions  . Amoxicillin Rash  . Prednisone Rash    Review of Systems  Constitutional: Negative for fever and chills.  Respiratory: Negative for shortness of breath.   Cardiovascular: Negative for chest pain.  Gastrointestinal: Positive for abdominal pain. Negative for nausea and vomiting.  Genitourinary: Positive for vaginal bleeding. Negative for  dysuria, urgency and frequency.  Neurological: Negative for dizziness and headaches.    OBJECTIVE Blood pressure 140/60, pulse 107, temperature 98.6 F (37 C), temperature source Oral, resp. rate 20, last menstrual period 01/19/2015. GENERAL: Well-developed, well-nourished female in no acute distress.  EYES: normal sclera/conjunctiva; no lid-lag HENT: Atraumatic, normocephalic HEART: normal rate RESP: normal effort ABDOMEN: Soft, non-tender MUSCULOSKELETAL: Normal ROM EXTREMITIES: Nontender, no edema NEURO/PSYCH: Alert and oriented, appropriate  affect  LAB RESULTS Results for orders placed or performed during the hospital encounter of 03/03/15 (from the past 24 hour(s))  CBC     Status: Abnormal   Collection Time: 03/03/15  6:33 PM  Result Value Ref Range   WBC 6.5 4.0 - 10.5 K/uL   RBC 3.92 3.87 - 5.11 MIL/uL   Hemoglobin 11.6 (L) 12.0 - 15.0 g/dL   HCT 16.1 (L) 09.6 - 04.5 %   MCV 86.5 78.0 - 100.0 fL   MCH 29.6 26.0 - 34.0 pg   MCHC 34.2 30.0 - 36.0 g/dL   RDW 40.9 81.1 - 91.4 %   Platelets 225 150 - 400 K/uL  hCG, quantitative, pregnancy     Status: Abnormal   Collection Time: 03/03/15  6:33 PM  Result Value Ref Range   hCG, Beta Chain, Quant, S 33662 (H) <5 mIU/mL    IMAGING US Ob Comp Less 14 Wks  02/27/2015   CLINICAL DATA:  24 year old G4 P2 AB1, LMP approximately 01/06/2015 though the patient is unsure of her LMP as she has had irregular menses (EGA [redacted] weeks 3 days), with an episode of vaginal bleeding (spotting) approximately 2 weeks ago.  EXAM: OBSTETRIC <14 WK [place] AND TRANSVAGINAL OB US  TECHNIQUE: Both transabdominal and transvaginal ultrasound examinations were performed for complete evaluation of the gestation as well as the maternal uterus, adnexal regions, and pelvic cul-de-sac. Transvaginal technique was performed to assess early pregnancy.  COMPARISON:  None for this gestation.  FINDINGS: Intrauterine gestational sac: Single, triangular or heart shaped.  Yolk sac: Likely present, but the entire circumferential wall of the yolk sac is not visualized.  Embryo:  Not present.  Cardiac Activity: Not applicable.  MSD: 13.9 mm   6 w   2 d  [place] EDC: 10/21/2015.  Maternal uterus/adnexae: Moderate-sized subchorionic hemorrhage. Normal-appearing left ovary measuring approximately 4.9 x 3.1 x 5.2 cm, containing a corpus luteum cyst. Normal-appearing right ovary with multiple follicular cysts measuring approximately 3.2 x 2.1 x 2.8 cm. Small amount of free fluid in the cul-de-sac.  IMPRESSION: 1. Irregularly-shaped gestational  sac containing a yolk sac (though the entire circumferential wall of the yolk sac was not visualized). No visible embryo currently. 2. Moderate-sized subchorionic hemorrhage. 3. Small amount of free fluid in the cul-de-sac. 4. Normal-appearing ovaries with a corpus luteum cyst in the left ovary. Followup ultrasound in 7-10 days may be helpful in further evaluation for determination of viability, as an embryo should be present within 7-10 days after a yolk sac is visible. Serial quantitative beta HCG may also be helpful.   Electronically Signed   By: Hulan Saas M.D.   On: 02/27/2015 16:00   US Ob Transvaginal  03/03/2015   CLINICAL DATA:  Acute onset of vaginal bleeding. Pelvic cramping. Initial encounter.  EXAM: TRANSVAGINAL OB ULTRASOUND  TECHNIQUE: Transvaginal ultrasound was performed for complete evaluation of the gestation as well as the maternal uterus, adnexal regions, and pelvic cul-de-sac.  COMPARISON:  Pelvic ultrasound performed 02/27/2015  FINDINGS: Intrauterine gestational sac: Visualized; somewhat irregular in shape,  slightly more elongated than on the prior study.  Yolk sac:  Yes  Embryo:  No  Cardiac Activity: N/A  Heart Rate: N/A bpm  MSD: 1.66 cm   6 w   4  d  Maternal uterus/adnexae: A tiny amount of subchorionic hemorrhage is noted, significantly decreased from the prior study. The patient's vaginal bleeding likely reflects the previously noted subchorionic hemorrhage.  The ovaries are unremarkable in appearance. The right ovary measures 3.5 x 2.1 x 2.0 cm, while the left ovary measures 4.0 x 3.0 x 3.0 cm. No suspicious adnexal masses are seen; there is no evidence for ovarian torsion.  A small amount of free fluid is noted within the pelvis.  IMPRESSION: 1. Single intrauterine gestational sac noted, with a mean sac diameter of 1.7 cm, corresponding to a gestational age of [redacted] weeks 4 days. This matches the gestational age of [redacted] weeks 1 day by LMP, reflecting an estimated delivery of  October 26, 2015. The gestational sac is somewhat irregular in shape, and slightly more elongated than on the prior study. However, the lack of an embryo remains within normal limits, given the size of the gestational sac. A yolk sac is seen. Follow-up pelvic ultrasound could be considered in 2 weeks for further evaluation, as deemed clinically appropriate. 2. Tiny amount of subchorionic hemorrhage has significantly improved from the prior study. The patient's acute vaginal bleeding likely reflects the previously noted subchorionic hemorrhage.   Electronically Signed   By: Roanna RaiderJeffery  Chang M.D.   On: 03/03/2015 21:35   [place] Ob Transvaginal  02/27/2015   CLINICAL DATA:  24 year old G4 P2 AB1, LMP approximately 01/06/2015 though the patient is unsure of her LMP as she has had irregular menses (EGA [redacted] weeks 3 days), with an episode of vaginal bleeding (spotting) approximately 2 weeks ago.  EXAM: OBSTETRIC <14 WK US AND TRANSVAGINAL OB US  TECHNIQUE: Both transabdominal and transvaginal ultrasound examinations were performed for complete evaluation of the gestation as well as the maternal uterus, adnexal regions, and pelvic cul-de-sac. Transvaginal technique was performed to assess early pregnancy.  COMPARISON:  None for this gestation.  FINDINGS: Intrauterine gestational sac: Single, triangular or heart shaped.  Yolk sac: Likely present, but the entire circumferential wall of the yolk sac is not visualized.  Embryo:  Not present.  Cardiac Activity: Not applicable.  MSD: 13.9 mm   6 w   2 d  US EDC: 10/21/2015.  Maternal uterus/adnexae: Moderate-sized subchorionic hemorrhage. Normal-appearing left ovary measuring approximately 4.9 x 3.1 x 5.2 cm, containing a corpus luteum cyst. Normal-appearing right ovary with multiple follicular cysts measuring approximately 3.2 x 2.1 x 2.8 cm. Small amount of free fluid in the cul-de-sac.  IMPRESSION: 1. Irregularly-shaped gestational sac containing a yolk sac (though the entire  circumferential wall of the yolk sac was not visualized). No visible embryo currently. 2. Moderate-sized subchorionic hemorrhage. 3. Small amount of free fluid in the cul-de-sac. 4. Normal-appearing ovaries with a corpus luteum cyst in the left ovary. Followup ultrasound in 7-10 days may be helpful in further evaluation for determination of viability, as an embryo should be present within 7-10 days after a yolk sac is visible. Serial quantitative beta HCG may also be helpful.   Electronically Signed   By: Hulan Saashomas  Lawrence M.D.   On: 02/27/2015 16:00    ASSESSMENT 1. Confirm fetal viability with history of miscarriage, ultrasound   2. Vaginal bleeding in pregnancy, first trimester    Report to Vonzella NippleJulie Wenzel, PA  Misty StanleyLisa  [place] Certified Nurse-Midwife 03/03/2015  11:22 PM  MDM Patient was upset with the way that care was handled at Dr. Tawni Levy office today.  Apologized to the patient for added stress and offered to take over care at [facility] at least until pregnancy outcome is confirmed Patient agrees  A: IUGS and YS at [redacted]w[redacted]d; some growth noted, unable to confirm missed AB Spotting in pregnancy, first trimester Abdominal pain in pregnancy, first trimester  P: Discharge home  bleeding precautions discussed Patient advised to follow-up with [facility] in 1 week to confirm viability vs missed AB Patient will return to MAU after [place] for results Patient may return to MAU as needed or if her condition were to change or worsen  Marny Lowenstein, PA-C 03/03/2015 11:24 PM

## 2015-03-07 ENCOUNTER — Inpatient Hospital Stay (HOSPITAL_COMMUNITY): Payer: Medicaid Other

## 2015-03-07 ENCOUNTER — Inpatient Hospital Stay (HOSPITAL_COMMUNITY)
Admission: AD | Admit: 2015-03-07 | Discharge: 2015-03-07 | Disposition: A | Discharge status: Home / Self Care | Payer: Medicaid Other | Source: Ambulatory Visit | Attending: Family Medicine | Admitting: Family Medicine

## 2015-03-07 ENCOUNTER — Encounter (HOSPITAL_COMMUNITY): Payer: Self-pay | Admitting: *Deleted

## 2015-03-07 DIAGNOSIS — F17210 Nicotine dependence, cigarettes, uncomplicated: Secondary | ICD-10-CM | POA: Diagnosis not present

## 2015-03-07 DIAGNOSIS — O200 Threatened abortion: Secondary | ICD-10-CM | POA: Diagnosis not present

## 2015-03-07 DIAGNOSIS — O209 Hemorrhage in early pregnancy, unspecified: Secondary | ICD-10-CM

## 2015-03-07 DIAGNOSIS — O99331 Smoking (tobacco) complicating pregnancy, first trimester: Secondary | ICD-10-CM | POA: Insufficient documentation

## 2015-03-07 DIAGNOSIS — Z3A01 Less than 8 weeks gestation of pregnancy: Secondary | ICD-10-CM | POA: Diagnosis not present

## 2015-03-07 LAB — HCG, QUANTITATIVE, PREGNANCY: hCG, Beta Chain, Quant, S: 62749 m[IU]/mL — ABNORMAL HIGH (ref <5)

## 2015-03-07 LAB — WET PREP, GENITAL
Clue Cells Wet Prep HPF POC: NONE SEEN
Trich, Wet Prep: NONE SEEN
Yeast Wet Prep HPF POC: NONE SEEN

## 2015-03-07 LAB — CBC
HCT: 32.8 % — ABNORMAL LOW (ref 36.0–46.0)
Hemoglobin: 11.6 g/dL — ABNORMAL LOW (ref 12.0–15.0)
MCH: 30.6 pg (ref 26.0–34.0)
MCHC: 35.4 g/dL (ref 30.0–36.0)
MCV: 86.5 fL (ref 78.0–100.0)
Platelets: 213 10*3/uL (ref 150–400)
RBC: 3.79 MIL/uL — ABNORMAL LOW (ref 3.87–5.11)
RDW: 12.8 % (ref 11.5–15.5)
WBC: 6.8 10*3/uL (ref 4.0–10.5)

## 2015-03-07 NOTE — MAU Note (Signed)
Pt presents to MAU with complaints of heavy vaginal bleeding. Reports she was evaluated a week ago with threatened miscarriage.

## 2015-03-07 NOTE — MAU Provider Note (Signed)
History     CSN: [id number]  Arrival date and time: 03/07/15 1714   First Provider Initiated Contact with Patient 03/07/15 1807      Chief Complaint  Patient presents with  . Vaginal Bleeding   HPI Pt is 3782w3d pregnant G4P2 SABx1 here for bleeding in pregnancy. Pt was initially seen on 02/18/2015 for verification of pregnancy Pt then had US on 02/27/2015 for outpatient ultrasound for dating with normal IUP On 03/05/2015 pt seen for spotting  And intermittent abdominal pain after seeing OB in [place] U/S showing likely failed pregnancy.  Pt has had SAB previously and wanted a second opinion. Review of records did not find wet prep or GC/chlamydia.  Nursing MAU Note 03/07/2015 5:26 PM    Expand All Collapse All   Pt presents to MAU with complaints of heavy vaginal bleeding. Reports she was evaluated a week ago with threatened miscarriage      Past Medical History  Diagnosis Date  . UTI (lower urinary tract infection)   . Abnormal Pap smear     follow up was wnl  . Acne   . Hypotension     taken out of work r/t bp drops    Past Surgical History  Procedure Laterality Date  . Wisdom tooth extraction      Family History  Problem Relation Age of Onset  . Hypertension Mother   . Diabetes Maternal Grandmother   . Cancer Maternal Grandfather   . Anesthesia problems Neg Hx   . Hypotension Neg Hx   . Pseudochol deficiency Neg Hx   . Malignant hyperthermia Neg Hx     History  Substance Use Topics  . Smoking status: Current Every Day Smoker -- 0.50 packs/day    Types: Cigarettes  . Smokeless tobacco: Never Used  . Alcohol Use: No    Allergies:  Allergies  Allergen Reactions  . Amoxicillin Rash  . Prednisone Rash    Prescriptions prior to admission  Medication Sig Dispense Refill Last Dose  . albuterol (PROVENTIL HFA;VENTOLIN HFA) 108 (90 BASE) MCG/ACT inhaler Inhale 2 puffs into the lungs every 6 (six) hours as needed. SOB   rescue  . prenatal vitamin w/FE, FA  (PRENATAL 1 + 1) 27-1 MG TABS tablet Take 1 tablet by mouth daily. 30 each 0 Past Week at Unknown time    Review of Systems  Constitutional: Negative for fever and chills.  Gastrointestinal: Positive for abdominal pain. Negative for nausea, vomiting, diarrhea and constipation.  Genitourinary: Negative for dysuria.   Physical Exam   Blood pressure 132/73, pulse 83, temperature 98.2 F (36.8 C), resp. rate 18, last menstrual period 01/19/2015.  Physical Exam  Nursing note and vitals reviewed. Constitutional: She is oriented to person, place, and time. She appears well-developed and well-nourished. No distress.  HENT:  Head: Normocephalic.  Eyes: Pupils are equal, round, and reactive to light.  Neck: Normal range of motion. Neck supple.  Cardiovascular: Normal rate.   Respiratory: Effort normal.  GI: Soft. She exhibits no distension. There is no tenderness. There is no rebound and no guarding.  Genitourinary:  Small amount of thick creamy discharge in vagina; cervix closed, NT; uterus NSSC NT; adnexa without palpable enlargement or tenderness  Musculoskeletal: Normal range of motion.  Neurological: She is alert and oriented to person, place, and time.  Skin: Skin is warm and dry.  Psychiatric: She has a normal mood and affect.    MAU Course  Procedures Results for orders placed or performed during  the hospital encounter of 03/07/15 (from the past 24 hour(s))  CBC     Status: Abnormal   Collection Time: 03/07/15  6:20 PM  Result Value Ref Range   WBC 6.8 4.0 - 10.5 K/uL   RBC 3.79 (L) 3.87 - 5.11 MIL/uL   Hemoglobin 11.6 (L) 12.0 - 15.0 g/dL   HCT 81.1 (L) 91.4 - 78.2 %   MCV 86.5 78.0 - 100.0 fL   MCH 30.6 26.0 - 34.0 pg   MCHC 35.4 30.0 - 36.0 g/dL   RDW 95.6 21.3 - 08.6 %   Platelets 213 150 - 400 K/uL  Wet prep, genital     Status: Abnormal   Collection Time: 03/07/15  6:25 PM  Result Value Ref Range   Yeast Wet Prep HPF POC NONE SEEN NONE SEEN   Trich, Wet Prep NONE  SEEN NONE SEEN   Clue Cells Wet Prep HPF POC NONE SEEN NONE SEEN   WBC, Wet Prep HPF POC FEW (A) NONE SEEN  US Ob Transvaginal  03/07/2015   CLINICAL DATA:  Six weeks 5 days pregnant with spotting and cramping.  EXAM: TRANSVAGINAL OB ULTRASOUND  TECHNIQUE: Transvaginal ultrasound was performed for complete evaluation of the gestation as well as the maternal uterus, adnexal regions, and pelvic cul-de-sac.  COMPARISON:  03/03/2015  FINDINGS: Intrauterine gestational sac: Visualized. Remains irregular in shape.  Yolk sac:  Present  Embryo:  Present  Cardiac Activity: Present  Heart Rate: 100 bpm  CRL:   4  mm   6 w 1 d                  [place] EDC: 10/30/2015  Maternal uterus/adnexae: Moderate subchorionic hemorrhage, increased. Superiorly, this measures 1.9 x 1.5 by 2 cm. In the lower uterine segment, measures 1.6 x 0.5 x 0.4 cm. Normal right ovary. A left ovarian corpus luteal cyst.  Moderate amount of free fluid.  IMPRESSION: 1. Intrauterine pregnancy of approximately 6 weeks 1 day with fetal heart rate of 100 beats per min. 2. Increase in moderate subchorionic hemorrhage. 3. Persistently irregular gestational sac, indeterminate. 4. Moderate free pelvic fluid. 5. Left ovarian corpus luteal cyst.   Electronically Signed   By: Jeronimo Greaves M.D.   On: 03/07/2015 19:09   GC/chlamydia pending Assessment and Plan  Bleeding in pregnancy; threatened miscarriage Viable [redacted]w[redacted]d pregnancy with mod [facility] and irregular sac F/u with Dr. Shawnie Pons for scheduled appointment- sooner if increase in pain or bleeding  Clairissa Valvano 03/07/2015, 6:41 PM

## 2015-03-07 NOTE — Discharge Instructions (Signed)

## 2015-03-07 NOTE — MAU Note (Signed)
E Signature not working; had patient sign printed copy.

## 2015-03-09 ENCOUNTER — Ambulatory Visit (HOSPITAL_COMMUNITY): Payer: Medicaid Other

## 2015-03-09 LAB — GC/CHLAMYDIA PROBE AMP (~~LOC~~) NOT AT ARMC
Chlamydia: NEGATIVE
Neisseria Gonorrhea: POSITIVE — AB

## 2015-03-16 ENCOUNTER — Inpatient Hospital Stay (HOSPITAL_COMMUNITY): Payer: Medicaid Other

## 2015-03-16 ENCOUNTER — Inpatient Hospital Stay (HOSPITAL_COMMUNITY)
Admission: AD | Admit: 2015-03-16 | Discharge: 2015-03-16 | Disposition: A | Discharge status: Home / Self Care | Payer: Medicaid Other | Source: Ambulatory Visit | Attending: Obstetrics | Admitting: Obstetrics

## 2015-03-16 ENCOUNTER — Encounter (HOSPITAL_COMMUNITY): Payer: Self-pay | Admitting: *Deleted

## 2015-03-16 DIAGNOSIS — O468X1 Other antepartum hemorrhage, first trimester: Secondary | ICD-10-CM | POA: Diagnosis not present

## 2015-03-16 DIAGNOSIS — O208 Other hemorrhage in early pregnancy: Secondary | ICD-10-CM | POA: Insufficient documentation

## 2015-03-16 DIAGNOSIS — O99331 Smoking (tobacco) complicating pregnancy, first trimester: Secondary | ICD-10-CM | POA: Diagnosis not present

## 2015-03-16 DIAGNOSIS — F17210 Nicotine dependence, cigarettes, uncomplicated: Secondary | ICD-10-CM | POA: Diagnosis not present

## 2015-03-16 DIAGNOSIS — O418X10 Other specified disorders of amniotic fluid and membranes, first trimester, not applicable or unspecified: Secondary | ICD-10-CM

## 2015-03-16 DIAGNOSIS — O209 Hemorrhage in early pregnancy, unspecified: Secondary | ICD-10-CM

## 2015-03-16 DIAGNOSIS — Z3A01 Less than 8 weeks gestation of pregnancy: Secondary | ICD-10-CM | POA: Insufficient documentation

## 2015-03-16 LAB — URINALYSIS, ROUTINE W REFLEX MICROSCOPIC
Bilirubin Urine: NEGATIVE
Glucose, UA: NEGATIVE mg/dL
Hgb urine dipstick: NEGATIVE
Ketones, ur: NEGATIVE mg/dL
Leukocytes, UA: NEGATIVE
Nitrite: NEGATIVE
Protein, ur: NEGATIVE mg/dL
Specific Gravity, Urine: 1.015 (ref 1.005–1.030)
Urobilinogen, UA: 0.2 mg/dL (ref 0.0–1.0)
pH: 7 (ref 5.0–8.0)

## 2015-03-16 MED ORDER — PROMETHAZINE HCL 25 MG PO TABS: 25.0000 mg | ORAL_TABLET | Freq: Four times a day (QID) | ORAL | Status: DC | PRN

## 2015-03-16 NOTE — MAU Provider Note (Signed)
Chief Complaint: Vaginal Bleeding and Emesis During Pregnancy   First Provider Initiated Contact with Patient 03/16/15 1312     SUBJECTIVE HPI: Victoria Wright is a 24 y.o. [id number] at 7.3 weeks by 6.1 week US 03/07/15 who presents to Maternity Admissions reporting increase bleeding. [facility] Having light bleeding and pulling sensation in groin over the past three days associated w/ lifting pts and after vomiting. [facility]. Called office RE: bleeding and was instructed to come to MAU.   Seen in MAU 03/07/15 for bleeding. Dx Gonorrhea. She and partner were Tx'd.  O pos.  Past Medical History  Diagnosis Date  . UTI (lower urinary tract infection)   . Abnormal Pap smear     follow up was wnl  . Acne   . Hypotension     taken out of work r/t bp drops   OB History  Gravida Para Term Preterm AB SAB TAB Ectopic Multiple Living  4 2 2  1 1    2     # Outcome Date GA Lbr Len/2nd Weight Sex Delivery Anes PTL Lv  4 Current           3 Term 12/22/12 3541w2d 09:12 / 00:19 9 lb 7.9 oz (4.305 kg) M Vag-Spont EPI  Y  2 SAB           1 Term      Vag-Spont        Past Surgical History  Procedure Laterality Date  . Wisdom tooth extraction     History   Social History  . Marital Status: Single    Spouse Name: N/A  . Number of Children: 2  . Years of Education: N/A   Occupational History  . CNA    Social History Main Topics  . Smoking status: Current Some Day Smoker -- 0.50 packs/day    Types: Cigarettes  . Smokeless tobacco: Never Used  . Alcohol Use: No  . Drug Use: No  . Sexual Activity: Yes    Birth Control/ Protection: None   Other Topics Concern  . Not on file   Social History Narrative   No current facility-administered medications on file prior to encounter.   Current Outpatient Prescriptions on File Prior to Encounter  Medication Sig Dispense Refill  . prenatal vitamin w/FE, FA (PRENATAL 1 + 1) 27-1 MG TABS tablet Take 1 tablet by mouth daily. 30  each 0  . albuterol (PROVENTIL HFA;VENTOLIN HFA) 108 (90 BASE) MCG/ACT inhaler Inhale 2 puffs into the lungs every 6 (six) hours as needed for wheezing or shortness of breath.      Allergies  Allergen Reactions  . Amoxicillin Rash  . Prednisone Rash    Review of Systems  Constitutional: Negative for fever and chills.  Gastrointestinal: Positive for abdominal pain (mild pulling in groin). Negative for nausea, vomiting, diarrhea and constipation.  Genitourinary: Negative for dysuria, urgency, frequency, hematuria and flank pain.       Pos for vaginal bleeding, vaginal discharge  Skin: Negative for itching.  Neurological: Negative for dizziness and weakness.    OBJECTIVE Blood pressure 123/70, pulse 91, temperature 98.7 F (37.1 C), resp. rate 20, last menstrual period 01/19/2015. GENERAL: Well-developed, well-nourished female in no acute distress. No Pallor.  HEART: normal rate RESP: normal effort GI: Abdomen soft, non-tender.  MS: Nontender, no edema NEURO: Alert and oriented SPECULUM EXAM: Declined due to recent exams.   LAB RESULTS Results for orders placed or performed during the  hospital encounter of 03/16/15 (from the past 24 hour(s))  Urinalysis, Routine w reflex microscopic     Status: None   Collection Time: 03/16/15 12:57 PM  Result Value Ref Range   Color, Urine YELLOW YELLOW   APPearance CLEAR CLEAR   Specific Gravity, Urine 1.015 1.005 - 1.030   pH 7.0 5.0 - 8.0   Glucose, UA NEGATIVE NEGATIVE mg/dL   Hgb urine dipstick NEGATIVE NEGATIVE   Bilirubin Urine NEGATIVE NEGATIVE   Ketones, ur NEGATIVE NEGATIVE mg/dL   Protein, ur NEGATIVE NEGATIVE mg/dL   Urobilinogen, UA 0.2 0.0 - 1.0 mg/dL   Nitrite NEGATIVE NEGATIVE   Leukocytes, UA NEGATIVE NEGATIVE   Too early for GC TOC  IMAGING [place] Ob Comp Less 14 Wks  03/16/2015   CLINICAL DATA:  Pregnant, bleeding  EXAM: OBSTETRIC <14 WK ULTRASOUND  TECHNIQUE: Transvaginal ultrasound was performed for complete evaluation  of the gestation as well as the maternal uterus, adnexal regions, and pelvic cul-de-sac.  COMPARISON:  None.  FINDINGS: Intrauterine gestational sac: Visualized/irregular in shape.  Yolk sac:  Present  Embryo:  Present  Cardiac Activity: Present  Heart Rate: 135 bpm  CRL:   9.1  mm   7 w 0 d                  US EDC: 11/02/2015  Maternal uterus/adnexae: Moderate subchronic hemorrhage.  Right ovary is within normal limits.  Left ovary is not discretely visualized.  Small pelvic fluid.  IMPRESSION: Single live intrauterine gestation with estimated gestational age [redacted] weeks 0 days by crown-rump length.  Mildly irregular gestational sac with moderate subchronic hemorrhage. Serial beta HCG is suggested. Consider follow-up pelvic ultrasound in 14 days (or earlier as clinically warranted).   Electronically Signed   By: Charline BillsSriyesh  Krishnan M.D.   On: 03/16/2015 15:25    Clarified w/ Radiologist rationale for recommendation for Serial beta HCG and follow-up pelvic ultrasound in 14 days. He states he is concerned that the irregular sac shape is associated w/ higher risk of SAB. HCG not really necessary since IUP confirmed. Pt has NOB scheduled and can have FHR assessed at that time.   MAU COURSE No active bleeding while in MAU. attempted informal BS US to check FHR. Unable to get measurement w/ certainly likely due to early gestational age. Formal US to verify cardiac activity ordered.  ASSESSMENT 1. Subchorionic hemorrhage in first trimester   2. Vaginal bleeding before [redacted] weeks gestation     PLAN Discharge home in stable condition. Explained that although straining may temporarily increase the egress of bleed [facility], it will not increase the risk for SAB. Offered work note for two days and note to decrease lifting for pt's peace of mind, but she declines. Rx Phenergan to minimize strain from vomiting.      Follow-up Information    [facility] On [facility]/2015.   Specialty:   Obstetrics and Gynecology   Why:  Start prenatal care   Contact information:   [address], [address] [place] [id number] [phone]      [facility].   Why:  As needed in emergencies   Contact information:   [address] [id number] mc [place] [id number] [phone]       Medication List    TAKE these medications        albuterol 108 (90 BASE) MCG/ACT inhaler  Commonly known  as:  PROVENTIL HFA;VENTOLIN HFA  Inhale 2 puffs into the lungs every 6 (six) hours as needed for wheezing or shortness of breath.     prenatal vitamin w/FE, FA 27-1 MG Tabs tablet  Take 1 tablet by mouth daily.      ASK your doctor about these medications        promethazine 25 MG tablet  Commonly known as:  PHENERGAN  Take 1 tablet (25 mg total) by mouth every 6 (six) hours as needed for nausea.  Ask about: Which instructions should I use?     promethazine 25 MG tablet  Commonly known as:  PHENERGAN  Take 1 tablet (25 mg total) by mouth every 6 (six) hours as needed.  Ask about: Which instructions should I use?       [place], [place] 03/16/2015  3:51 PM

## 2015-03-16 NOTE — Discharge Instructions (Signed)
Subchorionic Hematoma °A subchorionic hematoma is a gathering of blood between the outer wall of the placenta and the inner wall of the womb (uterus). The placenta is the organ that connects the fetus to the wall of the uterus. The placenta performs the feeding, breathing (oxygen to the fetus), and waste removal (excretory work) of the fetus.  °Subchorionic hematoma is the most common abnormality found on a result from ultrasonography done during the first trimester or early second trimester of pregnancy. If there has been little or no vaginal bleeding, early small hematomas usually shrink on their own and do not affect your baby or pregnancy. The blood is gradually absorbed over 1-2 weeks. When bleeding starts later in pregnancy or the hematoma is larger or occurs in an older pregnant woman, the outcome may not be as good. Larger hematomas may get bigger, which increases the chances for miscarriage. Subchorionic hematoma also increases the risk of premature detachment of the placenta from the uterus, preterm (premature) labor, and stillbirth. °HOME CARE INSTRUCTIONS °· Stay on bed rest if your health care provider recommends this. Although bed rest will not prevent more bleeding or prevent a miscarriage, your health care provider may recommend bed rest until you are advised otherwise. °· Avoid heavy lifting (more than 10 lb [4.5 kg]), exercise, sexual intercourse, or douching as directed by your health care provider. °· Keep track of the number of pads you use each day and how soaked (saturated) they are. Write down this information. °· Do not use tampons. °· Keep all follow-up appointments as directed by your health care provider. Your health care provider may ask you to have follow-up blood tests or ultrasound tests or both. °SEEK IMMEDIATE MEDICAL CARE IF: °· You have severe cramps in your stomach, back, abdomen, or pelvis. °· You have a fever. °· You pass large clots or tissue. Save any tissue for your health  care provider to look at. °· Your bleeding increases or you become lightheaded, feel weak, or have fainting episodes. °Document Released: 02/08/2007 Document Revised: 03/10/2014 Document Reviewed: 05/23/2013 °ExitCare® Patient Information ©2015 [employer]. This information is not intended to replace advice given to you by your health care provider. Make sure you discuss any questions you have with your health care provider. °Vaginal Bleeding During Pregnancy, First Trimester °A small amount of bleeding (spotting) from the vagina is relatively common in early pregnancy. It usually stops on its own. Various things may cause bleeding or spotting in early pregnancy. Some bleeding may be related to the pregnancy, and some may not. In most cases, the bleeding is normal and is not a problem. However, bleeding can also be a sign of something serious. Be sure to tell your health care provider about any vaginal bleeding right away. °Some possible causes of vaginal bleeding during the first trimester include: °· Infection or inflammation of the cervix. °· Growths (polyps) on the cervix. °· Miscarriage or threatened miscarriage. °· Pregnancy tissue has developed outside of the uterus and in a fallopian tube (tubal pregnancy). °· Tiny cysts have developed in the uterus instead of pregnancy tissue (molar pregnancy). °HOME CARE INSTRUCTIONS  °Watch your condition for any changes. The following actions may help to lessen any discomfort you are feeling: °· Follow your health care provider's instructions for limiting your activity. If your health care provider orders bed rest, you may need to stay in bed and only get up to use the bathroom. However, your health care provider may allow you to continue light   activity. °· If needed, make plans for someone to help with your regular activities and responsibilities while you are on bed rest. °· Keep track of the number of pads you use each day, how often you change pads, and how soaked  (saturated) they are. Write this down. °· Do not use tampons. Do not douche. °· Do not have sexual intercourse or orgasms until approved by your health care provider. °· If you pass any tissue from your vagina, save the tissue so you can show it to your health care provider. °· Only take over-the-counter or prescription medicines as directed by your health care provider. °· Do not take aspirin because it can make you bleed. °· Keep all follow-up appointments as directed by your health care provider. °SEEK MEDICAL CARE IF: °· You have any vaginal bleeding during any part of your pregnancy. °· You have cramps or labor pains. °· You have a fever, not controlled by medicine. °SEEK IMMEDIATE MEDICAL CARE IF:  °· You have severe cramps in your back or belly (abdomen). °· You pass large clots or tissue from your vagina. °· Your bleeding increases. °· You feel light-headed or weak, or you have fainting episodes. °· You have chills. °· You are leaking fluid or have a gush of fluid from your vagina. °· You pass out while having a bowel movement. °MAKE SURE YOU: °· Understand these instructions. °· Will watch your condition. °· Will get help right away if you are not doing well or get worse. °Document Released: 08/03/2005 Document Revised: 10/29/2013 Document Reviewed: 07/01/2013 °ExitCare® Patient Information ©2015 [employer]. This information is not intended to replace advice given to you by your health care provider. Make sure you discuss any questions you have with your health care provider. ° °

## 2015-03-16 NOTE — MAU Note (Signed)
Last couple wks has been noticing bleeding with activities,(lifting pt's at work, vomiting).  Increased over weekend.  Some pulling type pains. Called office - instructed to come here.

## 2015-03-18 ENCOUNTER — Telehealth: Payer: Self-pay | Admitting: *Deleted

## 2015-03-18 NOTE — Telephone Encounter (Signed)
5/11  11:00 Patient has been to the hospital a couple times and is early pregnant. Her first NOB appointment with us is not until 6/1- can you review her lab and US and make recommendation- she is our patient and she is concerned about being 1 week behind in development.

## 2015-03-20 ENCOUNTER — Other Ambulatory Visit: Payer: Self-pay | Admitting: Obstetrics

## 2015-03-20 NOTE — Telephone Encounter (Signed)
Keep appt

## 2015-03-26 NOTE — Telephone Encounter (Signed)
Dr Clearance CootsHarper reviewed patient Victoria Wright and feels they are within margin of error- he called patient to review with her and she informed him that she had miscarried the pregnancy. He advised her to follow up in a few weeks for birth control.

## 2015-04-08 ENCOUNTER — Encounter: Payer: Medicaid Other | Admitting: Certified Nurse Midwife

## 2015-05-05 NOTE — Telephone Encounter (Signed)
Attempt to call patient for follow-up. Number in chart is incorrect/not in service

## 2015-05-12 ENCOUNTER — Other Ambulatory Visit: Payer: Self-pay | Admitting: Certified Nurse Midwife

## 2015-07-24 ENCOUNTER — Telehealth: Payer: Self-pay | Admitting: Obstetrics

## 2015-07-24 NOTE — Telephone Encounter (Signed)
COMPLETED

## 2015-07-24 NOTE — Telephone Encounter (Signed)
07/24/2015 -  Unable to reach patient with any numbers available. Per Erskine Squibb sent RX back to pharmacy requesting patient call office to schedule appointment before the RX can be refilled. brm

## 2015-10-28 ENCOUNTER — Telehealth: Payer: Self-pay | Admitting: *Deleted

## 2015-11-03 NOTE — Telephone Encounter (Signed)
Error

## 2015-12-02 ENCOUNTER — Other Ambulatory Visit: Payer: Self-pay | Admitting: Certified Nurse Midwife

## 2015-12-27 IMAGING — US US OB TRANSVAGINAL
1 series · 13 of 28 positions shown · non-contrast
Comparison: Pelvic ultrasound performed 02/27/2015

CLINICAL DATA: Acute onset of vaginal bleeding. Pelvic cramping.
Initial encounter.

EXAM:
TRANSVAGINAL OB ULTRASOUND
TECHNIQUE: Transvaginal ultrasound was performed for complete evaluation of the
gestation as well as the maternal uterus, adnexal regions, and
pelvic cul-de-sac.

[Series 1: us ob transvaginal · 13 of 37 slices shown]
[im 2/37]
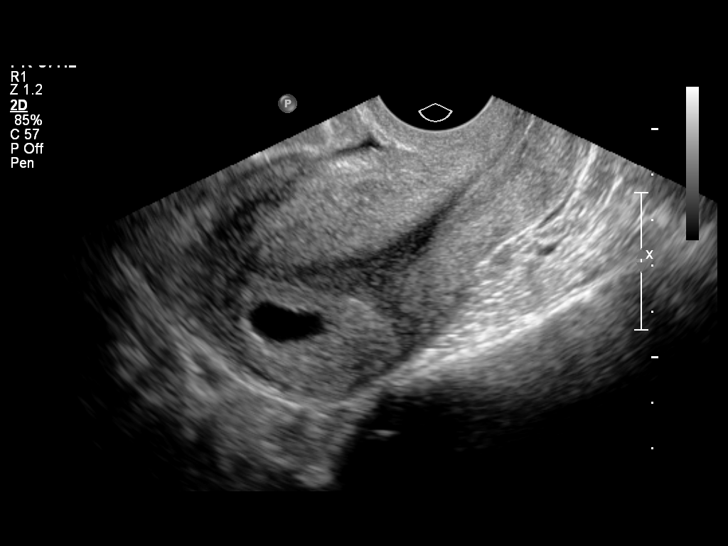
[im 5/37]
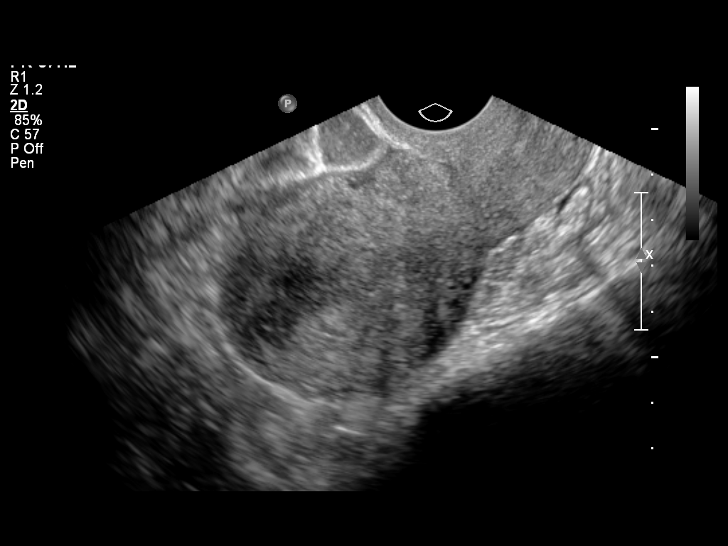
[im 7/37]
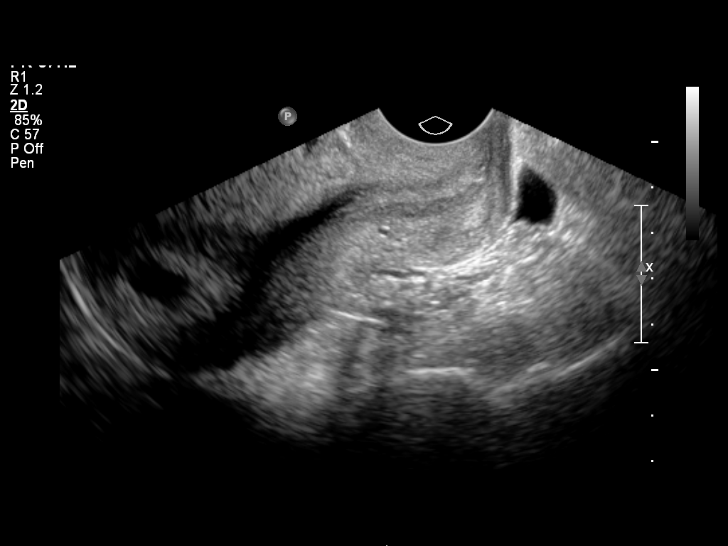
[im 10/37]
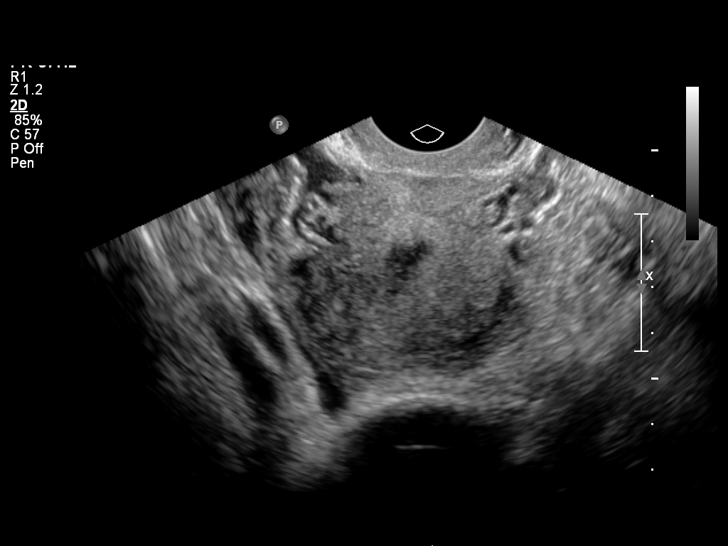
[im 13/37]
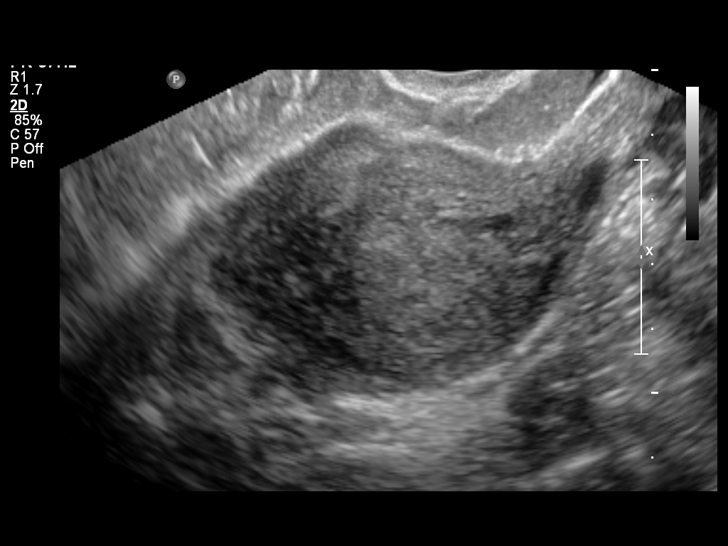
[im 15/37]
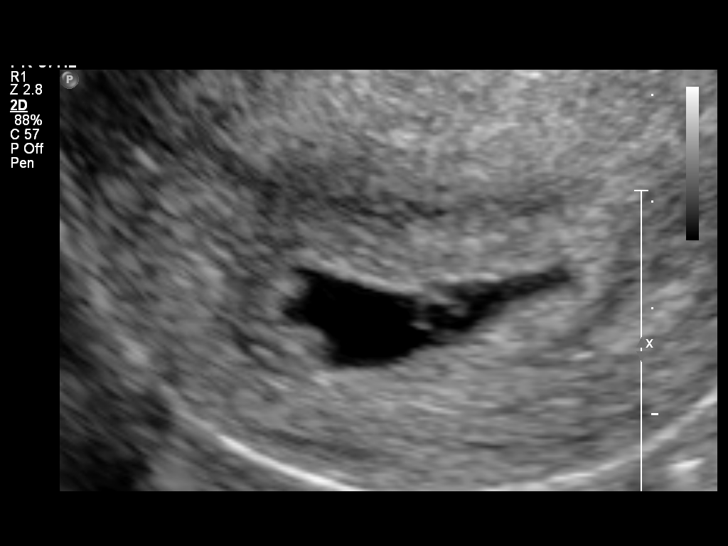
[im 19/37]
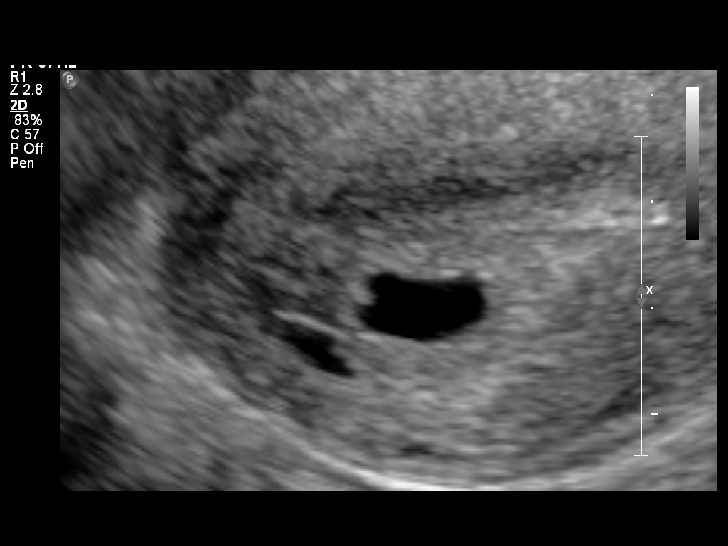
[im 22/37]
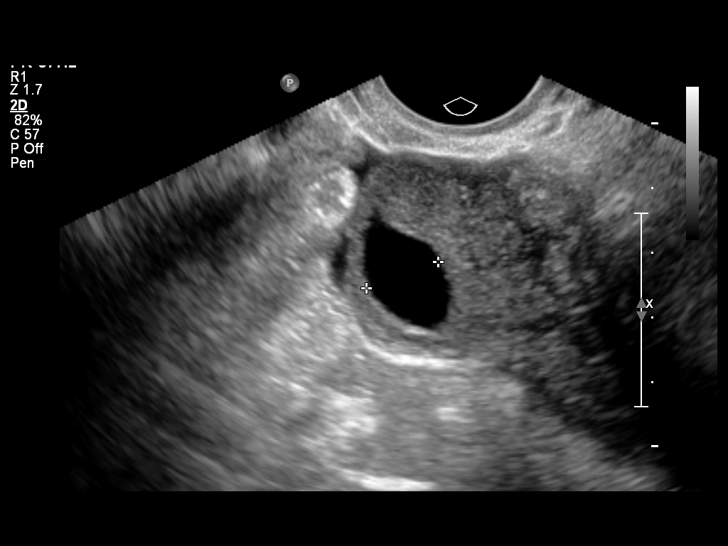
[im 25/37]
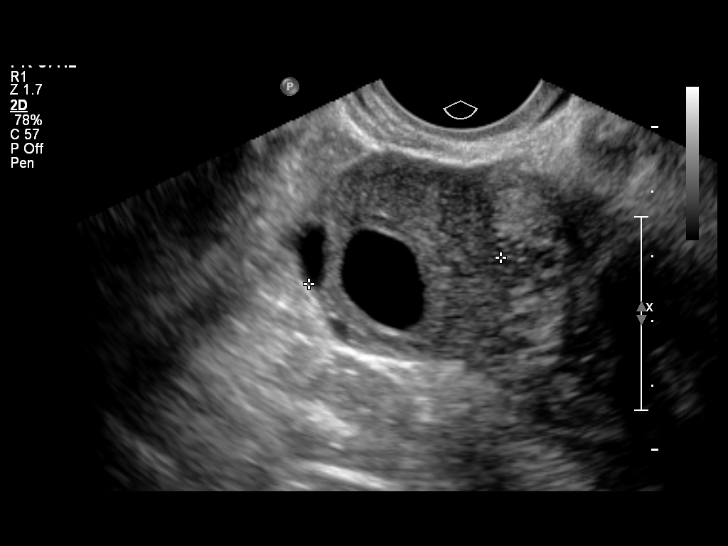
[im 27/37]
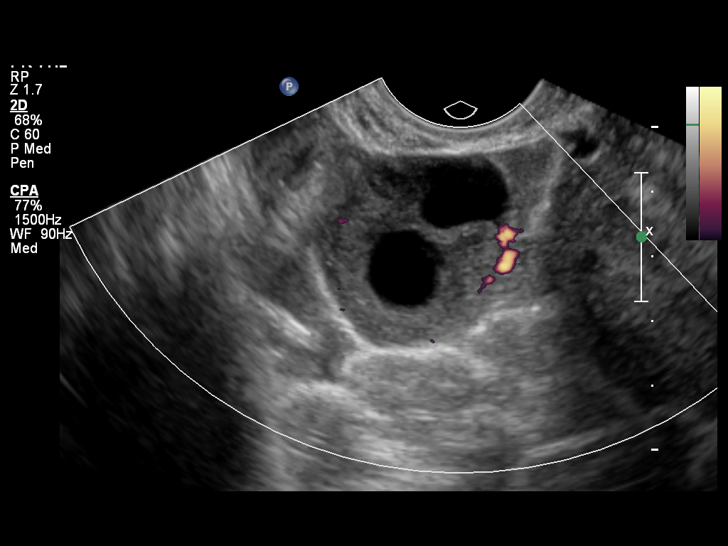
[im 30/37]
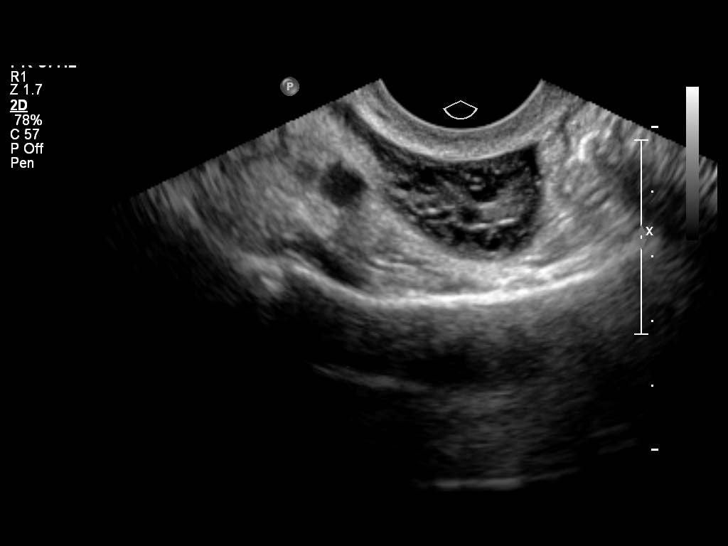
[im 33/37]
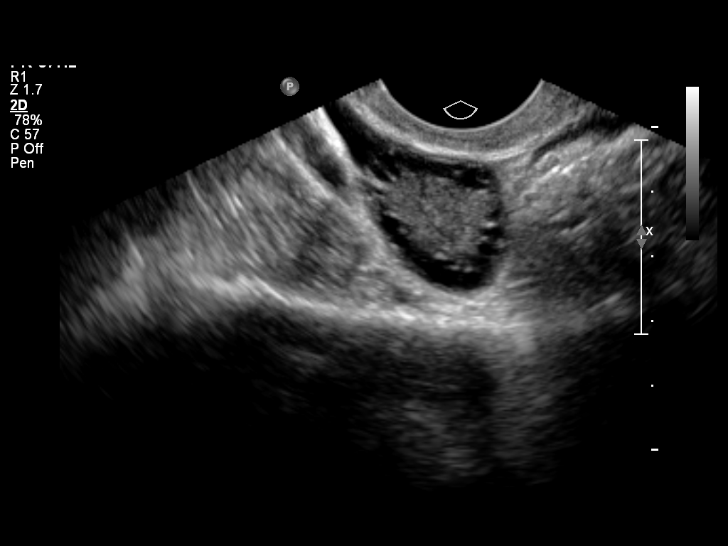
[im 35/37]
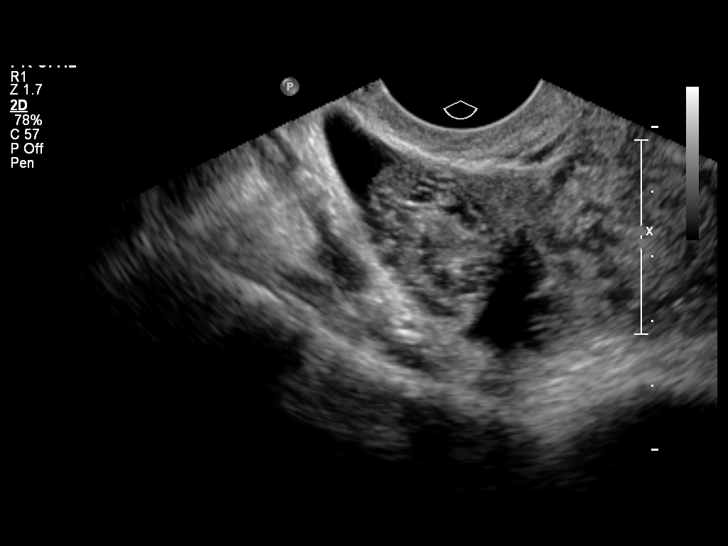

[13 of 28 positions shown; findings below may reference images not displayed]

FINDINGS: Intrauterine gestational sac: Visualized; somewhat irregular in
shape, slightly more elongated than on the prior study.

Yolk sac:  Yes

Embryo:  No

Cardiac Activity: N/A

Heart Rate: N/A bpm

MSD: 1.66 cm   6 w   4  d

Maternal uterus/adnexae: A tiny amount of subchorionic hemorrhage is
noted, significantly decreased from the prior study. The patient's
vaginal bleeding likely reflects the previously noted subchorionic
hemorrhage.

The ovaries are unremarkable in appearance. The right ovary measures
3.5 x 2.1 x 2.0 cm, while the left ovary measures 4.0 x 3.0 x
cm. No suspicious adnexal masses are seen; there is no evidence for
ovarian torsion.

A small amount of free fluid is noted within the pelvis.
IMPRESSION: 1. Single intrauterine gestational sac noted, with a mean sac
diameter of 1.7 cm, corresponding to a gestational age of 6 weeks 4
days. This matches the gestational age of 6 weeks 1 day by LMP,
reflecting an estimated delivery October 26, 2015. The
gestational sac is somewhat irregular in shape, and slightly more
elongated than on the prior study. However, the lack of an embryo
remains within normal limits, given the size of the gestational sac.
A yolk sac is seen. Follow-up pelvic ultrasound could be considered
in 2 weeks for further evaluation, as deemed clinically appropriate.
2. Tiny amount of subchorionic hemorrhage has significantly improved
from the prior study. The patient's acute vaginal bleeding likely
reflects the previously noted subchorionic hemorrhage.

## 2015-12-31 IMAGING — US US OB TRANSVAGINAL
1 series · 14 of 24 positions shown · non-contrast
Comparison: 03/03/2015

CLINICAL DATA: Six weeks 5 days pregnant with spotting and
cramping.

EXAM:
TRANSVAGINAL OB ULTRASOUND
TECHNIQUE: Transvaginal ultrasound was performed for complete evaluation of the
gestation as well as the maternal uterus, adnexal regions, and
pelvic cul-de-sac.

[Series 1: us ob transvaginal · 14 of 24 slices shown]
[im 1/24]
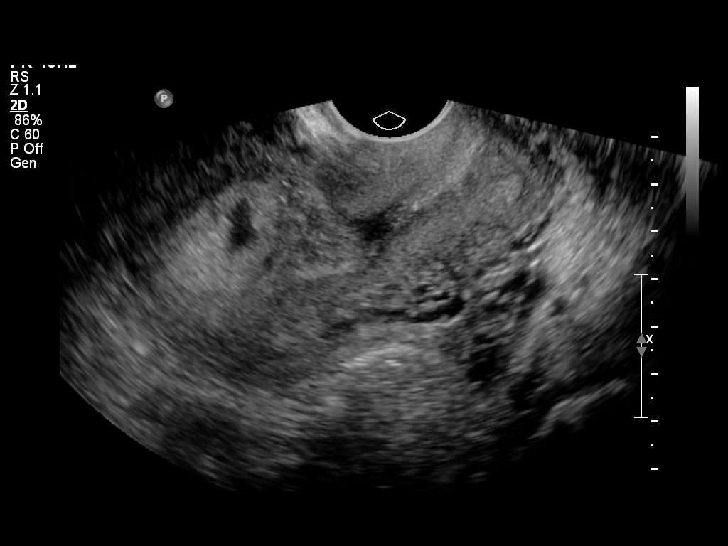
[im 3/24]
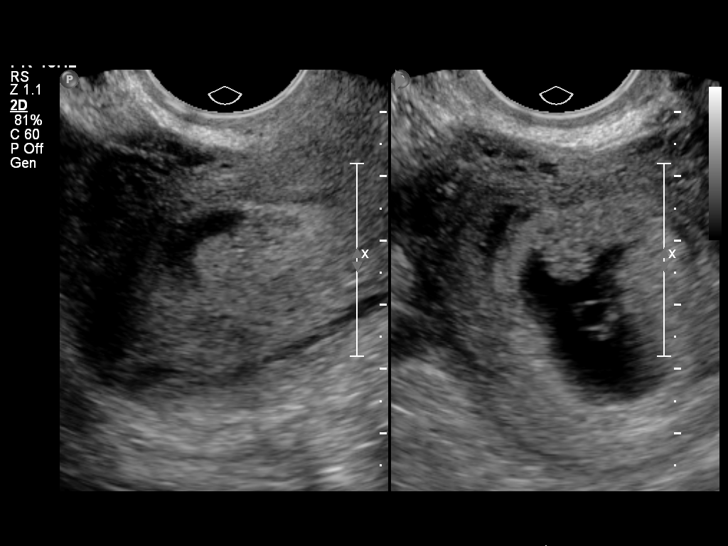
[im 5/24]
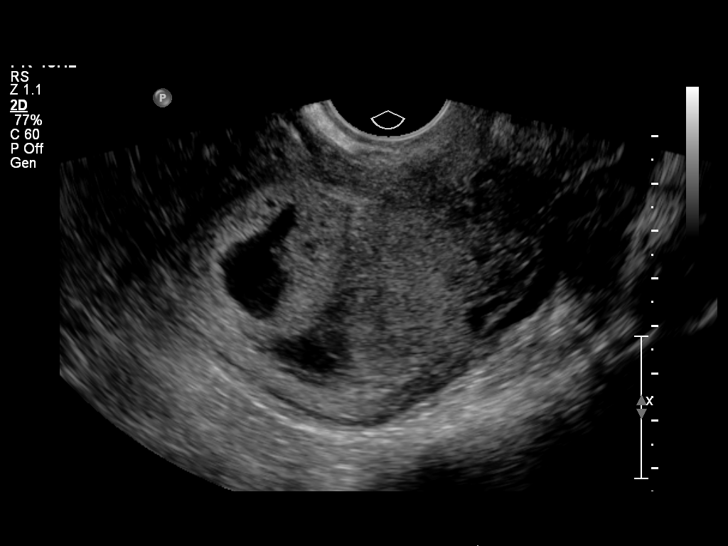
[im 7/24]
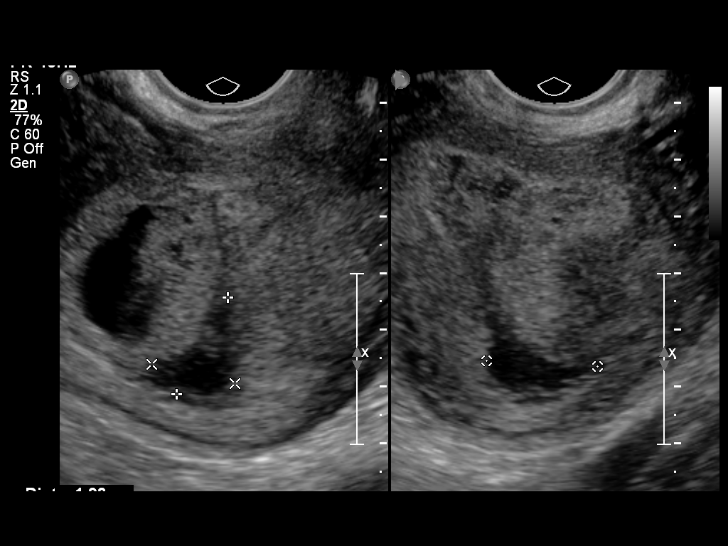
[im 8/24]
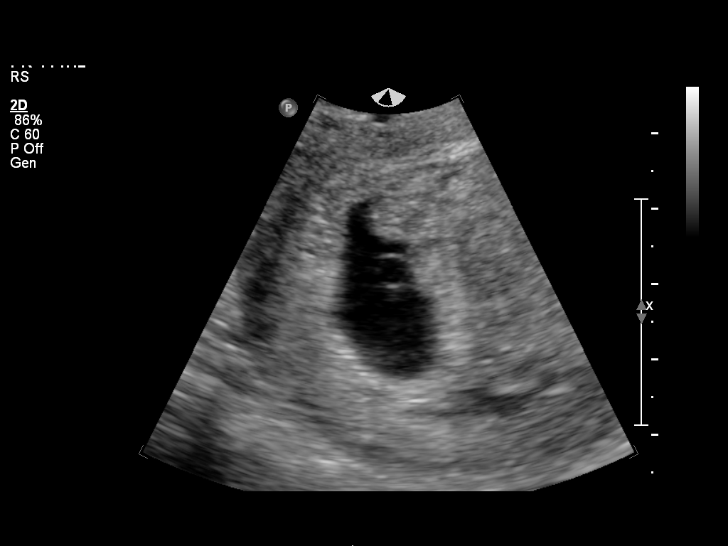
[im 10/24]
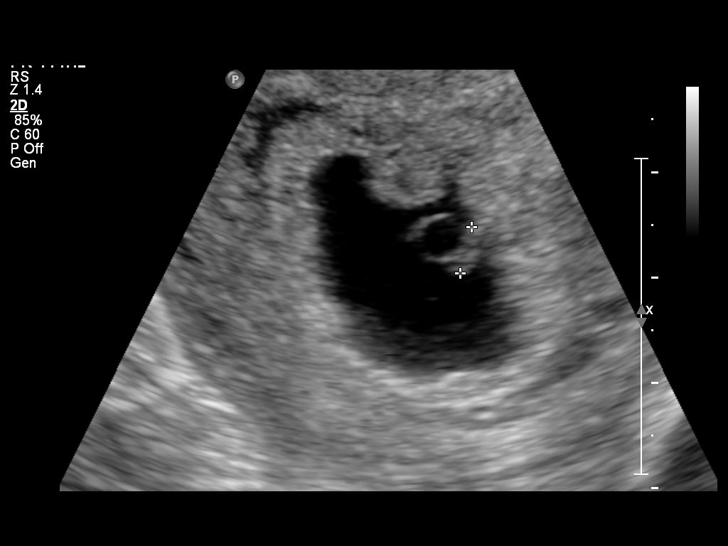
[im 12/24]
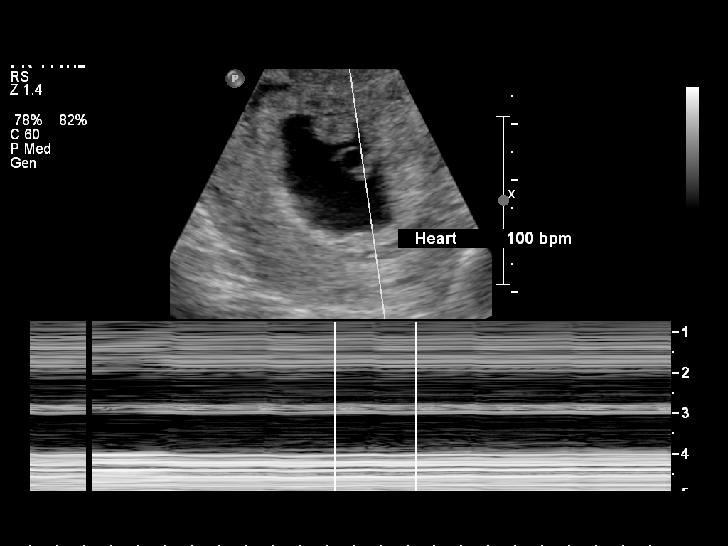
[im 13/24]
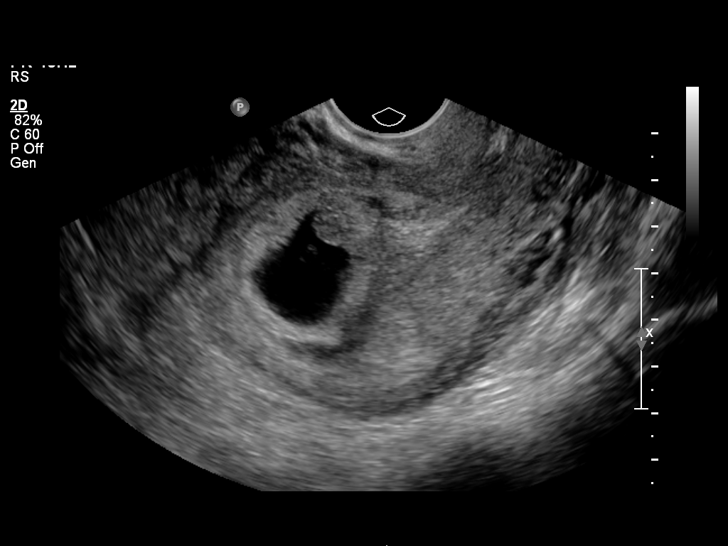
[im 15/24]
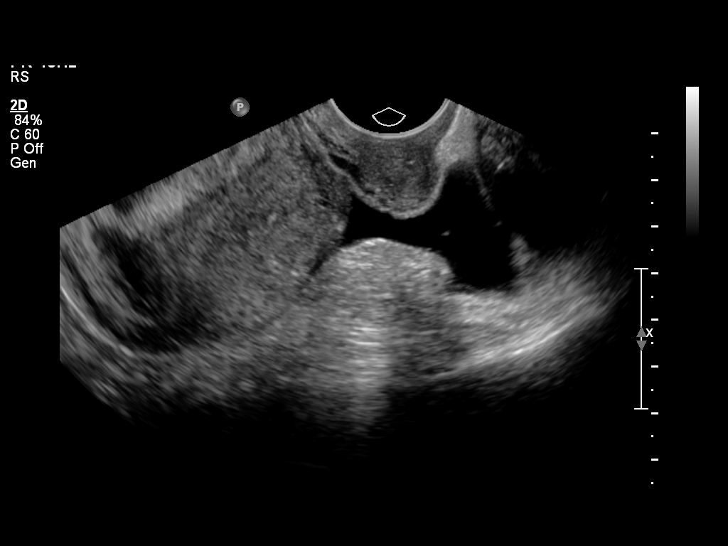
[im 17/24]
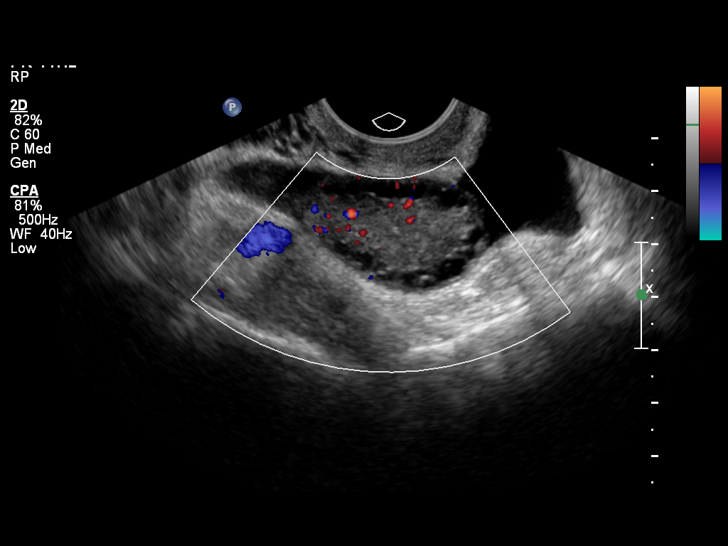
[im 19/24]
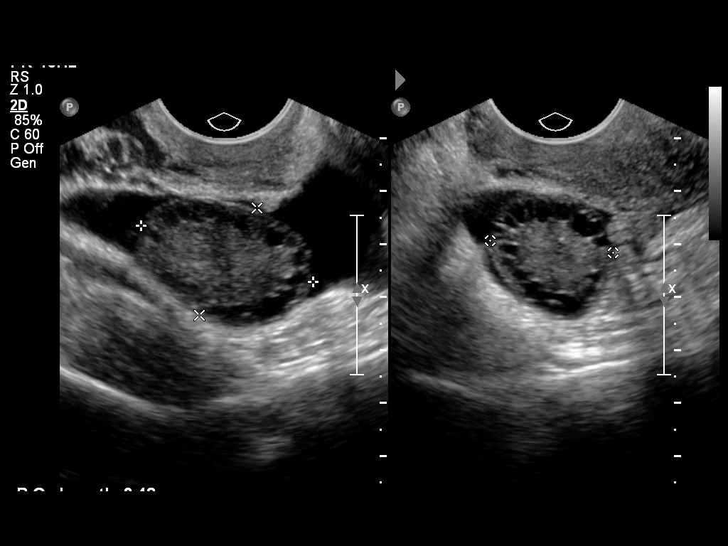
[im 20/24]
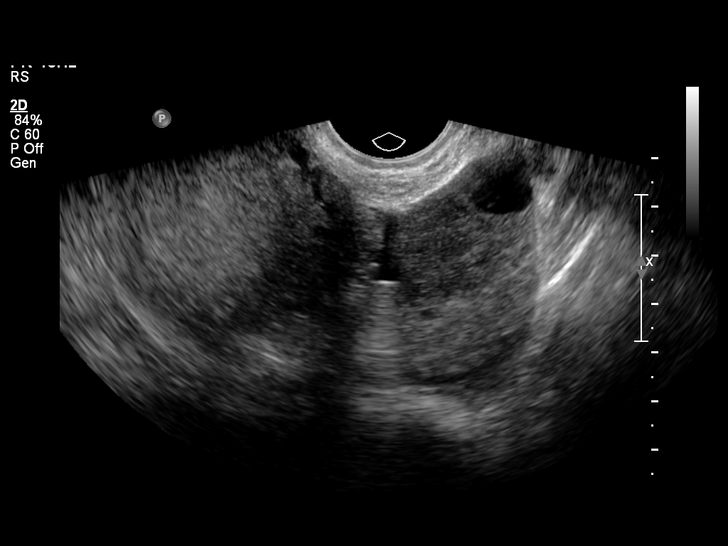
[im 22/24]
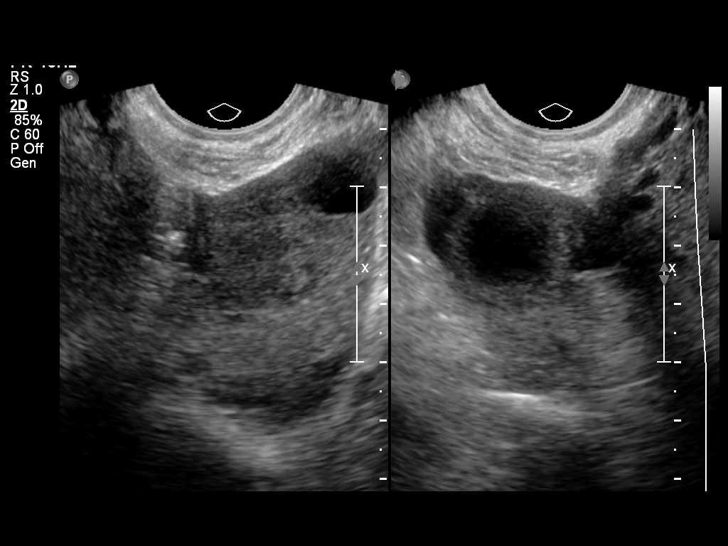
[im 24/24]
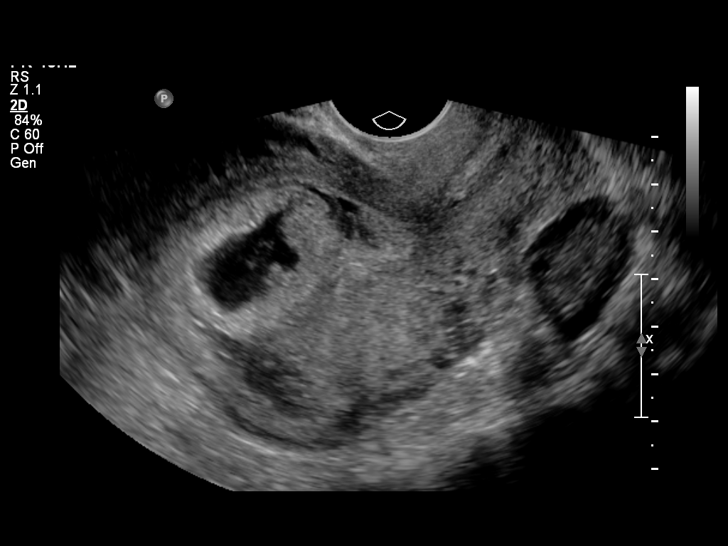

[14 of 24 positions shown; findings below may reference images not displayed]

FINDINGS: Intrauterine gestational sac: Visualized. Remains irregular in
shape.

Yolk sac:  Present

Embryo:  Present

Cardiac Activity: Present

Heart Rate: 100 bpm

CRL:   4  mm   6 w 1 d                  US EDC: 10/30/2015

Maternal uterus/adnexae: Moderate subchorionic hemorrhage,
increased. Superiorly, this measures 1.9 x 1.5 by 2 cm. In the lower
uterine segment, measures 1.6 x 0.5 x 0.4 cm. Normal right ovary. A
left ovarian corpus luteal cyst.

Moderate amount of free fluid.
IMPRESSION: 1. Intrauterine pregnancy of approximately 6 weeks 1 day with fetal
heart rate of 100 beats per min.
2. Increase in moderate subchorionic hemorrhage.
3. Persistently irregular gestational sac, indeterminate.
4. Moderate free pelvic fluid.
5. Left ovarian corpus luteal cyst.

## 2016-01-06 ENCOUNTER — Encounter (HOSPITAL_COMMUNITY): Payer: Self-pay | Admitting: *Deleted

## 2016-01-09 ENCOUNTER — Emergency Department (INDEPENDENT_AMBULATORY_CARE_PROVIDER_SITE_OTHER)
Admission: EM | Admit: 2016-01-09 | Discharge: 2016-01-09 | Disposition: A | Discharge status: Home / Self Care | Payer: BLUE CROSS/BLUE SHIELD | Source: Home / Self Care | Attending: Emergency Medicine | Admitting: Emergency Medicine

## 2016-01-09 ENCOUNTER — Encounter (HOSPITAL_COMMUNITY): Payer: Self-pay | Admitting: Emergency Medicine

## 2016-01-09 DIAGNOSIS — H10212 Acute toxic conjunctivitis, left eye: Secondary | ICD-10-CM

## 2016-01-09 DIAGNOSIS — T65891A Toxic effect of other specified substances, accidental (unintentional), initial encounter: Secondary | ICD-10-CM

## 2016-01-09 IMAGING — US US OB COMP LESS 14 WK
1 series · 14 of 28 positions shown · non-contrast
Comparison: None.

CLINICAL DATA: Pregnant, bleeding

EXAM:
OBSTETRIC <14 WK ULTRASOUND
TECHNIQUE: Transvaginal ultrasound was performed for complete evaluation of the
gestation as well as the maternal uterus, adnexal regions, and
pelvic cul-de-sac.

[Series 1: us ob comp less 14 wk · 14 of 44 slices shown]
[im 2/44]
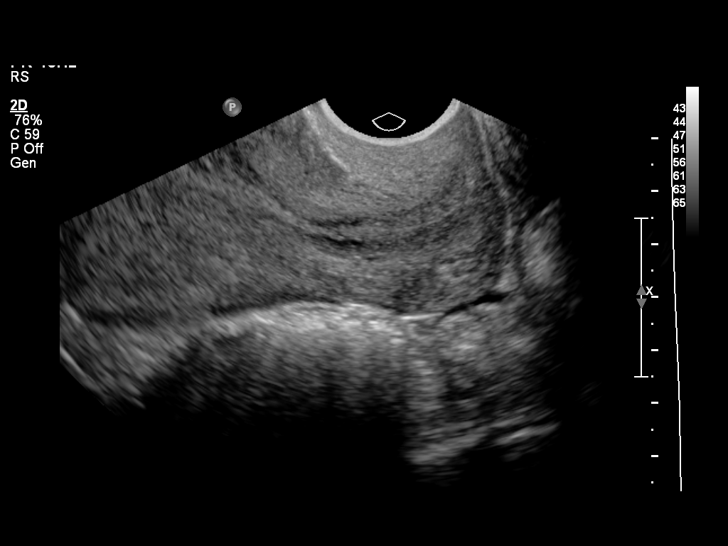
[im 5/44]
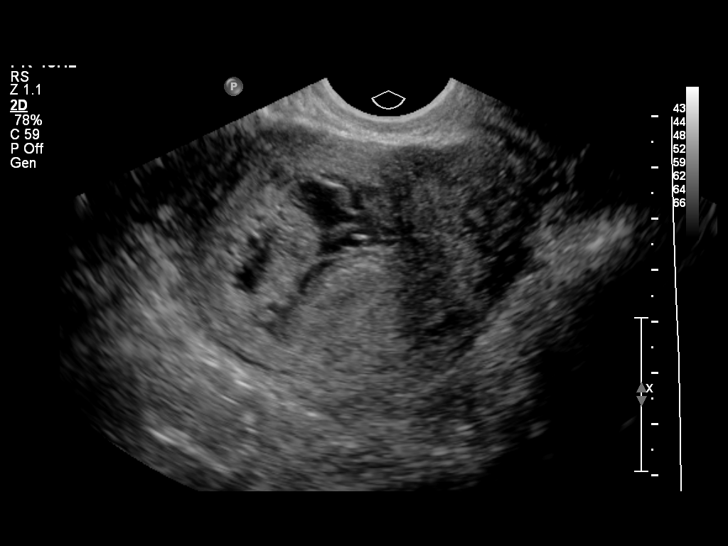
[im 8/44]
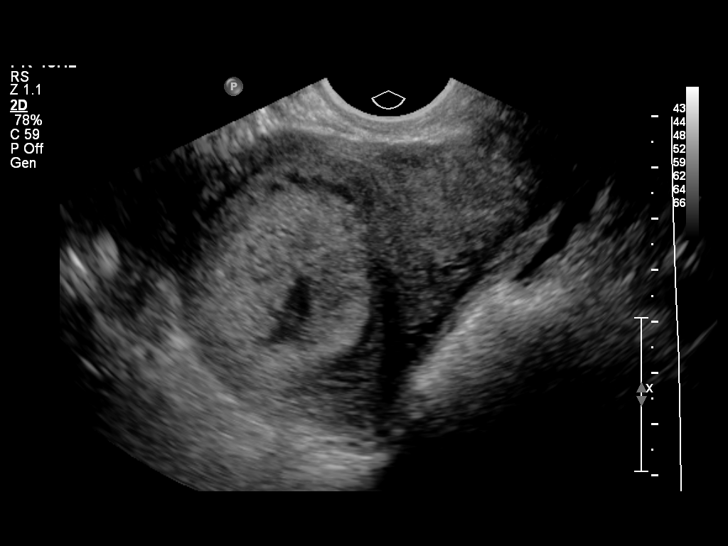
[im 12/44]
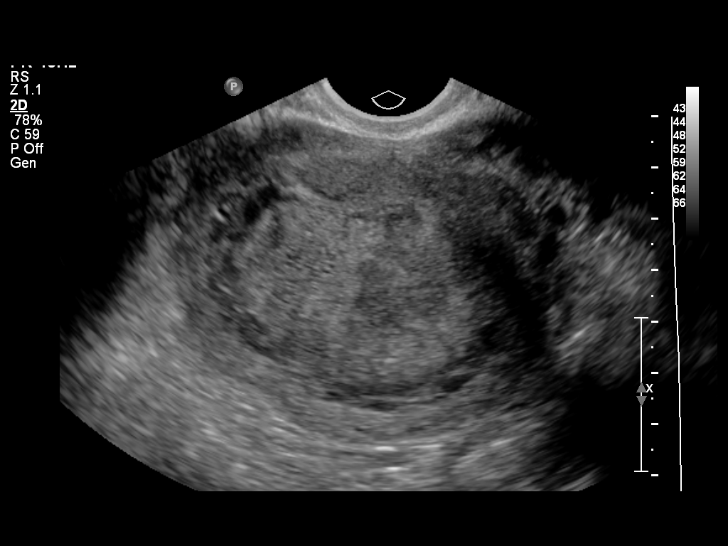
[im 15/44]
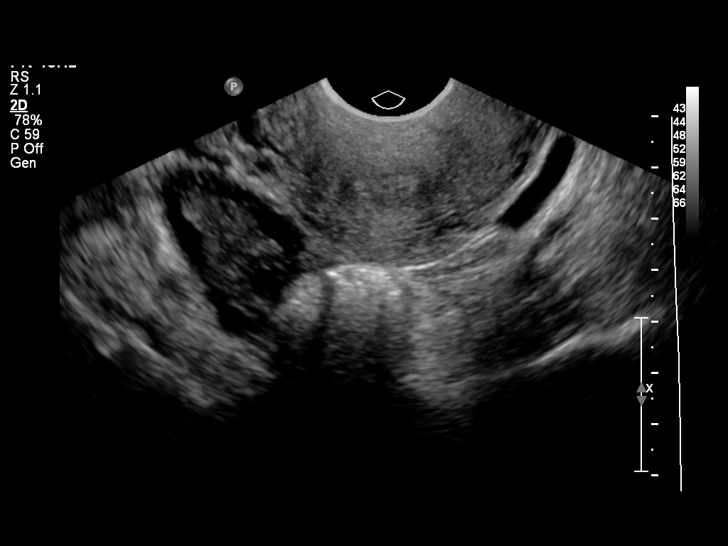
[im 18/44]
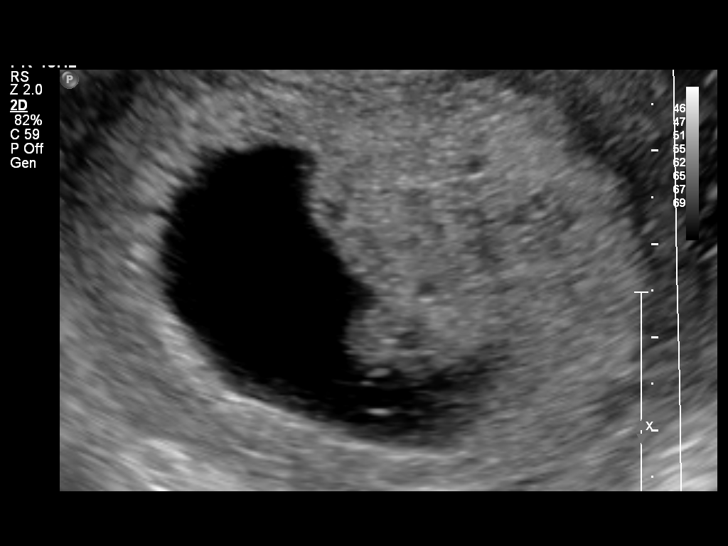
[im 21/44]
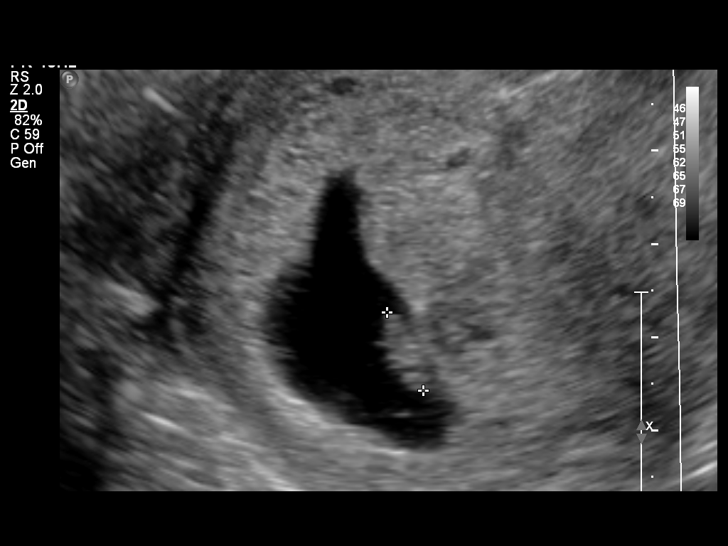
[im 24/44]
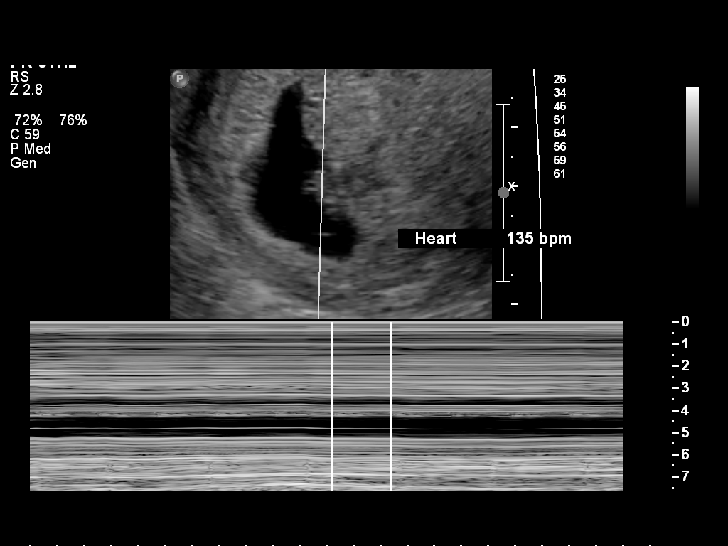
[im 28/44]
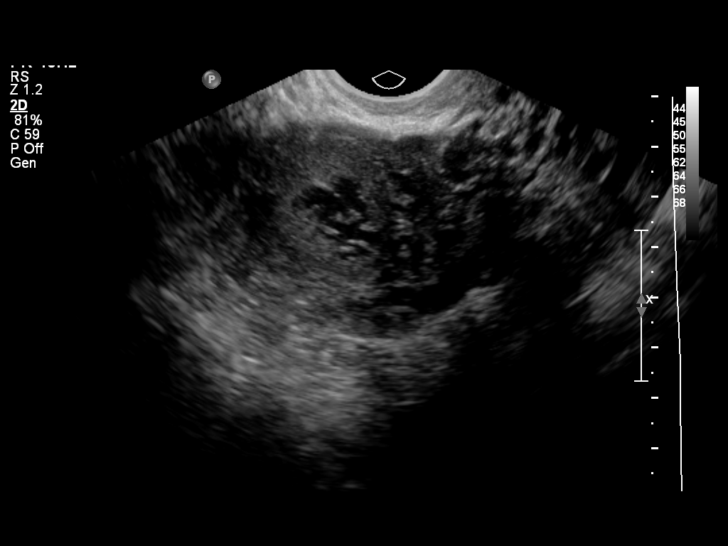
[im 31/44]
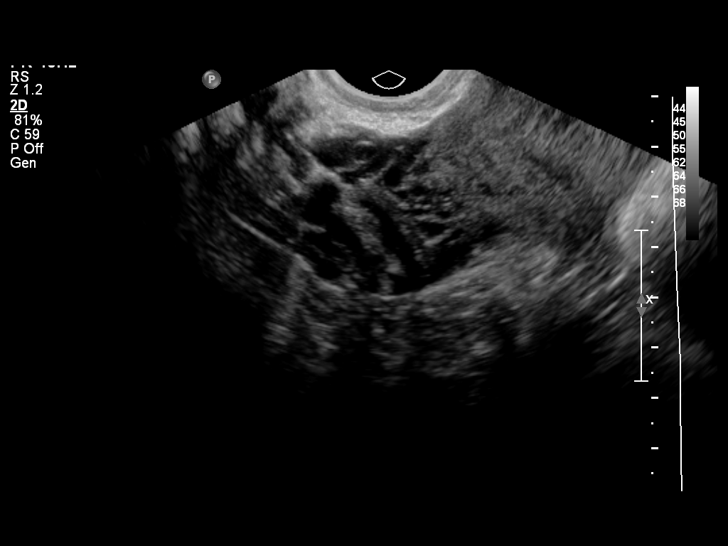
[im 34/44]
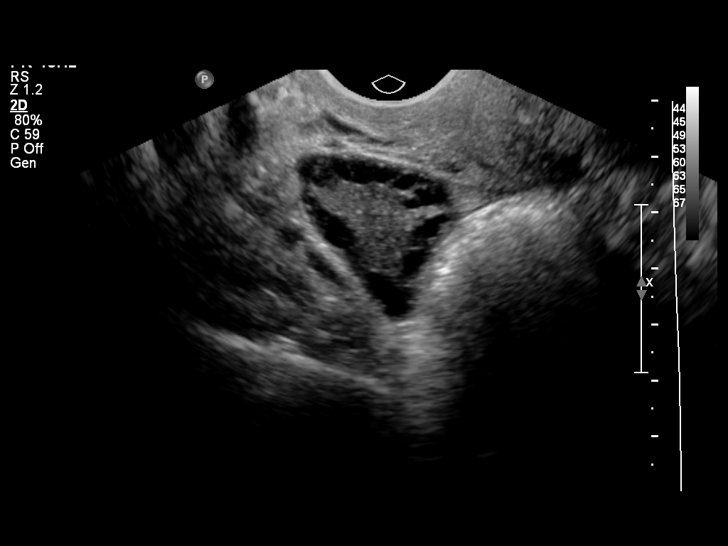
[im 37/44]
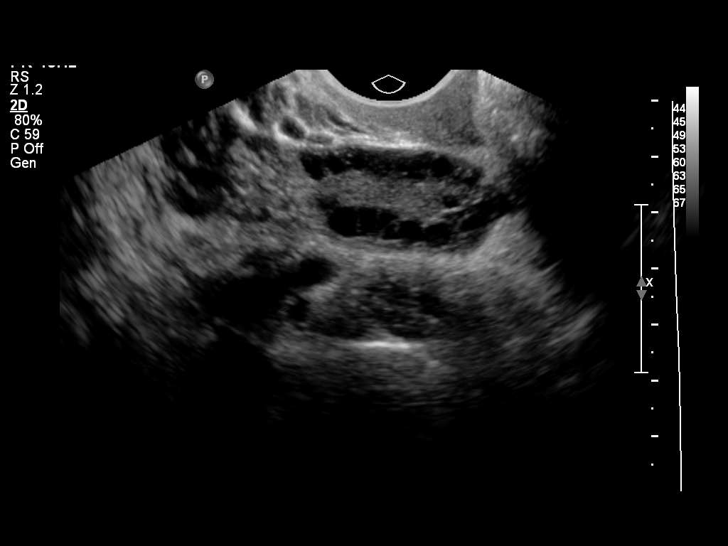
[im 40/44]
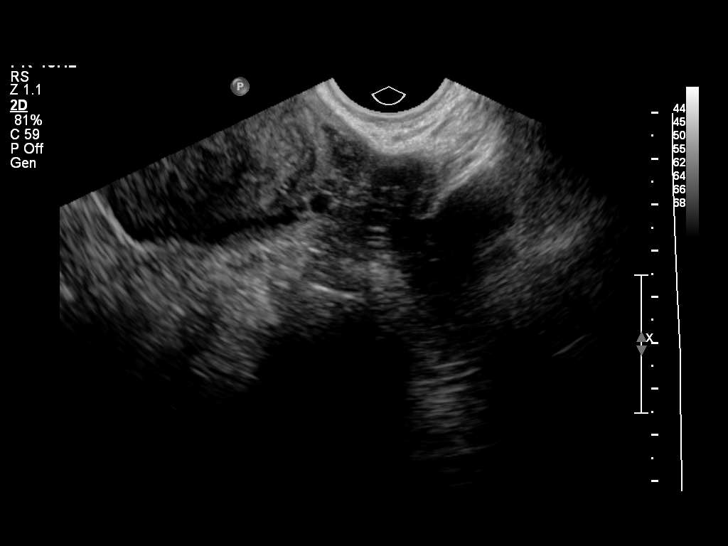
[im 44/44]
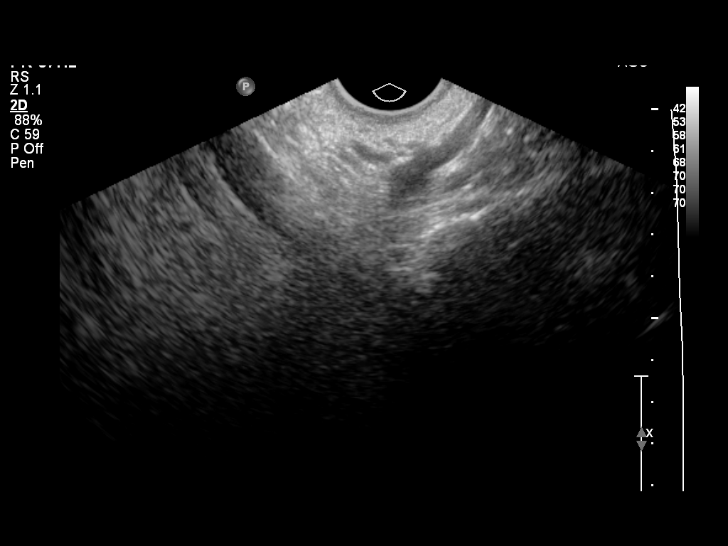

[14 of 28 positions shown; findings below may reference images not displayed]

FINDINGS: Intrauterine gestational sac: Visualized/irregular in shape.

Yolk sac:  Present

Embryo:  Present

Cardiac Activity: Present

Heart Rate: 135 bpm

CRL:   9.1  mm   7 w 0 d                  US EDC: 11/02/2015

Maternal uterus/adnexae: Moderate subchronic hemorrhage.

Right ovary is within normal limits.

Left ovary is not discretely visualized.

Small pelvic fluid.
IMPRESSION: Single live intrauterine gestation with estimated gestational age 7
weeks 0 days by crown-rump length.

Mildly irregular gestational sac with moderate subchronic
hemorrhage. Serial beta HCG is suggested. Consider follow-up pelvic
ultrasound in 14 days (or earlier as clinically warranted).

## 2016-01-09 NOTE — ED Provider Notes (Signed)
CSN: [id number]     Arrival date & time 01/09/16  1309 History   First MD Initiated Contact with Patient 01/09/16 1339     Chief Complaint  Patient presents with  . Eye Problem   (Consider location/radiation/quality/duration/timing/severity/associated sxs/prior Treatment) HPI  She is a 25 year old woman here for evaluation of left eye redness. She states this started 3 days ago. She states she was using a new hair care product, African hair oil, when sometime in her left eye. She states she had some itching and irritation of the eyes that day. She did wash it out. She has continued to have redness and drainage from the eye. She reports some crusting in the mornings. She states her vision will be blurred, but will clear with blinking. At this time, she denies any pain or irritation. No foreign body sensation. No sensitivity to light. She has not tried any medications.  Past Medical History  Diagnosis Date  . UTI (lower urinary tract infection)   . Abnormal Pap smear     follow up was wnl  . Acne   . Hypotension     taken out of work r/t bp drops   Past Surgical History  Procedure Laterality Date  . Wisdom tooth extraction     Family History  Problem Relation Age of Onset  . Hypertension Mother   . Diabetes Maternal Grandmother   . Cancer Maternal Grandfather   . Anesthesia problems Neg Hx   . Hypotension Neg Hx   . Pseudochol deficiency Neg Hx   . Malignant hyperthermia Neg Hx    Social History  Substance Use Topics  . Smoking status: Current Some Day Smoker -- 0.50 packs/day    Types: Cigarettes  . Smokeless tobacco: Never Used  . Alcohol Use: No   OB History    Gravida Para Term Preterm AB TAB SAB Ectopic Multiple Living   Review of Systems As in history of present illness Allergies  Amoxicillin and Prednisone  Home Medications   Prior to Admission medications   Medication Sig Start Date End Date Taking? Authorizing Provider  albuterol  (PROVENTIL HFA;VENTOLIN HFA) 108 (90 BASE) MCG/ACT inhaler Inhale 2 puffs into the lungs every 6 (six) hours as needed for wheezing or shortness of breath.     Historical Provider, MD  prenatal vitamin w/FE, FA (PRENATAL 1 + 1) 27-1 MG TABS tablet Take 1 tablet by mouth daily. 02/18/15   Deirdre Colin Mulders, CNM  promethazine (PHENERGAN) 25 MG tablet Take 1 tablet (25 mg total) by mouth every 6 (six) hours as needed for nausea. 06/10/11 06/17/11  Jean Rosenthal, NP  promethazine (PHENERGAN) 25 MG tablet Take 1 tablet (25 mg total) by mouth every 6 (six) hours as needed. 03/16/15   Dorathy Kinsman, CNM   Meds Ordered and Administered this Visit  Medications - No data to display  BP 124/81 mmHg  Pulse 88  Temp(Src) 97.8 F (36.6 C) (Oral)  SpO2 97%  LMP 12/28/2015 No data found.   Physical Exam  Constitutional: She is oriented to person, place, and time. She appears well-developed and well-nourished. No distress.  Eyes: EOM are normal. Pupils are equal, round, and reactive to light. Right eye exhibits no discharge. Left eye exhibits no discharge.  Left conjunctiva is injected. No foreign bodies.  Neck: Neck supple.  Cardiovascular: Normal rate.   Pulmonary/Chest: Effort normal.  Neurological: She is alert and oriented  to person, place, and time.    ED Course  Procedures (including critical care time)  Labs Review Labs Reviewed - No data to display  Imaging Review No results found.    MDM   1. Chemical conjunctivitis of left eye    Likely chemical conjunctivitis from the hair oil. Recommended OTC lubricating drops. No need for antibiotic drops at this time. Return to work note provided. Follow up as needed.    Charm RingsErin J Orell Hurtado, MD 01/09/16 [phone]

## 2016-01-09 NOTE — Discharge Instructions (Signed)
Your eye is red from irritation from the hair oil. This is not caused by an infection. This will likely take another several days to weeks to fully resolve. Please avoid over-the-counter eyedrops for red eye. I would recommend a regular lubricating eye drops several times a day. Follow-up as needed.   Chemical Conjunctivitis A thin, clear membrane (conjunctiva) covers the white part of your eye and the inner surface of your eyelid. The conjunctiva can become irritated by chemicals or other substances, such as smoke or chlorine. This is called chemical conjunctivitis. This condition can make your eye red, pink, and itchy. You may also have:  Watery eyes.  A burning feeling in your eyes.  Clear liquid from your eyes.  Swollen eyelids.  Sensitivity to light. You can get this this condition in one eye or both of your eyes. You cannot spread this condition to another person (noncontagious). HOME CARE  Take or apply medicines only as told by your doctor.  Apply a cool, clean washcloth to your eye for 10-20 minutes. Do this 3-4 times each day.  Do not touch or rub your eyes.  Do not wear contact lenses until the irritation is gone. Wear glasses instead.  Do not wear eye makeup until the irritation is gone.  Avoid being around the chemical or the environment that caused the irritation. Wear eye protection if you need to. GET HELP IF:  Your symptoms get worse.  You have new symptoms.  You have a fever.  You have a change in vision.  Your pain gets worse.   This information is not intended to replace advice given to you by your health care provider. Make sure you discuss any questions you have with your health care provider.   Document Released: 10/24/2005 Document Revised: 11/14/2014 Document Reviewed: 08/05/2014 Elsevier Interactive Patient Education [employer]2016 Elsevier Inc.

## 2016-01-09 NOTE — ED Notes (Signed)
Left eye is irritated and blurry vision.  Patient reports getting a hair product in left eye on Wednesday.  Thursday eye was red.  Friday reports swelling and drainage, pus-like drainage from left eye.  Saturday eye is still irritated

## 2016-03-11 ENCOUNTER — Telehealth: Payer: Self-pay | Admitting: *Deleted

## 2016-03-11 NOTE — Telephone Encounter (Signed)
Pt called to office stating she needs a refill on her Nuva Ring.   Review of chart shows pt has not been seen in office in a over a year.   Dr Clearance CootsHarper recommends pt have an appt made before refill can be approved.  Call placed to pt making her aware of need for appt. Pt [place] [facility] and has been seen at health dept there for physical exam.  Pt was asked if she would like to make appt here or follow up at health dept. Pt states that she will call health dept.  Pt advised if any problems she may call office if she would like an appt.

## 2016-04-18 ENCOUNTER — Ambulatory Visit: Payer: Medicaid Other | Admitting: Obstetrics

## 2016-05-02 ENCOUNTER — Ambulatory Visit (INDEPENDENT_AMBULATORY_CARE_PROVIDER_SITE_OTHER): Payer: Medicaid Other | Admitting: Obstetrics

## 2016-05-02 ENCOUNTER — Encounter: Payer: Self-pay | Admitting: Obstetrics

## 2016-05-02 VITALS — BP 122/74 | HR 84 | Wt 197.0 lb

## 2016-05-02 DIAGNOSIS — Z01419 Encounter for gynecological examination (general) (routine) without abnormal findings: Secondary | ICD-10-CM

## 2016-05-02 DIAGNOSIS — Z30019 Encounter for initial prescription of contraceptives, unspecified: Secondary | ICD-10-CM

## 2016-05-02 DIAGNOSIS — Z3049 Encounter for surveillance of other contraceptives: Secondary | ICD-10-CM

## 2016-05-02 LAB — POCT URINALYSIS DIPSTICK
Bilirubin, UA: NEGATIVE
Blood, UA: NEGATIVE
Glucose, UA: NEGATIVE
Ketones, UA: NEGATIVE
Leukocytes, UA: NEGATIVE
Nitrite, UA: NEGATIVE
Protein, UA: NEGATIVE
Spec Grav, UA: 1.01
Urobilinogen, UA: NEGATIVE
pH, UA: 8

## 2016-05-02 NOTE — Progress Notes (Signed)
Subjective:        Victoria Wright is a 25 y.o. female here for a routine exam.  Current complaints: Irregular periods and spotting after intercourse.    Personal health questionnaire:  Is patient Ashkenazi Jewish, have a family history of breast and/or ovarian cancer: no Is there a family history of uterine cancer diagnosed at age < 5250, gastrointestinal cancer, urinary tract cancer, family member who is a Personnel officerLynch syndrome-associated carrier: no Is the patient overweight and hypertensive, family history of diabetes, personal history of gestational diabetes, preeclampsia or PCOS: no Is patient over 3355, have PCOS,  family history of premature CHD under age 26, diabetes, smoke, have hypertension or peripheral artery disease:  no At any time, has a partner hit, kicked or otherwise hurt or frightened you?: no Over the past 2 weeks, have you felt down, depressed or hopeless?: no Over the past 2 weeks, have you felt little interest or pleasure in doing things?:no   Gynecologic History No LMP recorded. Contraception: none Last Pap: 2014. Results were: normal Last mammogram: n/a. Results were: n/a  Obstetric History OB History  Gravida Para Term Preterm AB SAB TAB Ectopic Multiple Living  4 2 2  1 1    2     # Outcome Date GA Lbr Len/2nd Weight Sex Delivery Anes PTL Lv  4 Term 12/22/12 464w2d 09:12 / 00:19 9 lb 7.9 oz (4.305 kg) M Vag-Spont EPI  Y  3 Gravida           2 SAB           1 Term      Vag-Spont         Past Medical History  Diagnosis Date  . UTI (lower urinary tract infection)   . Abnormal Pap smear     follow up was wnl  . Acne   . Hypotension     taken out of work r/t bp drops    Past Surgical History  Procedure Laterality Date  . Wisdom tooth extraction      No current outpatient prescriptions on file. Allergies  Allergen Reactions  . Amoxicillin Rash  . Prednisone Rash    Social History  Substance Use Topics  . Smoking status: Current Some Day Smoker --  0.50 packs/day    Types: Cigarettes  . Smokeless tobacco: Never Used  . Alcohol Use: No    Family History  Problem Relation Age of Onset  . Hypertension Mother   . Diabetes Maternal Grandmother   . Cancer Maternal Grandfather   . Anesthesia problems Neg Hx   . Hypotension Neg Hx   . Pseudochol deficiency Neg Hx   . Malignant hyperthermia Neg Hx       Review of Systems  Constitutional: negative for fatigue and weight loss Respiratory: negative for cough and wheezing Cardiovascular: negative for chest pain, fatigue and palpitations Gastrointestinal: negative for abdominal pain and change in bowel habits Musculoskeletal:negative for myalgias Neurological: negative for gait problems and tremors Behavioral/Psych: negative for abusive relationship, depression Endocrine: negative for temperature intolerance   Genitourinary:positive for abnormal menstrual periods.  Negative for genital lesions, hot flashes, sexual problems  Integument/breast: negative for breast lump, breast tenderness, nipple discharge and skin lesion(s)    Objective:       BP 122/74 mmHg  Pulse 84  Wt 197 lb (89.359 kg) General:   alert  Skin:   no rash or abnormalities  Lungs:   clear to auscultation bilaterally  Heart:  regular rate and rhythm, S1, S2 normal, no murmur, click, rub or gallop  Breasts:   normal without suspicious masses, skin or nipple changes or axillary nodes  Abdomen:  normal findings: no organomegaly, soft, non-tender and no hernia  Pelvis:  External genitalia: normal general appearance Urinary system: urethral meatus normal and bladder without fullness, nontender Vaginal: normal without tenderness, induration or masses Cervix: normal appearance Adnexa: normal bimanual exam Uterus: anteverted and non-tender, normal size   Lab Review Urine pregnancy test Labs reviewed yes Radiologic studies reviewed no    Assessment:    Healthy female exam.    AUB, with irregular cycles and  spotting after intercourse.  Probable hormonal imbalance.   Plan:    Education reviewed: low fat, low cholesterol diet, safe sex/STD prevention, self breast exams and weight bearing exercise. Contraception: NuvaRing vaginal inserts. Follow up in: 4 months.  Contraceptive surveillance and surveillance of AUB.   No orders of the defined types were placed in this encounter.   Orders Placed This Encounter  Procedures  . POCT urinalysis dipstick  . POCT urinalysis dipstick

## 2016-05-03 ENCOUNTER — Telehealth (HOSPITAL_COMMUNITY): Payer: Self-pay | Admitting: *Deleted

## 2016-05-04 NOTE — Telephone Encounter (Signed)
Pt returned call to office.  Returned call to pt. Pt made aware that Swab results are not completed.  Pt made aware of +yeast on results.  Pt advised to call office tomorrow and check to see if Swab is results are complete.

## 2016-05-05 ENCOUNTER — Other Ambulatory Visit: Payer: Self-pay | Admitting: Obstetrics

## 2016-05-05 DIAGNOSIS — B9689 Other specified bacterial agents as the cause of diseases classified elsewhere: Secondary | ICD-10-CM

## 2016-05-05 DIAGNOSIS — N760 Acute vaginitis: Principal | ICD-10-CM

## 2016-05-05 MED ORDER — METRONIDAZOLE 500 MG PO TABS: 500.0000 mg | ORAL_TABLET | Freq: Two times a day (BID) | ORAL | Status: DC

## 2016-05-06 LAB — PAP IG W/ RFLX HPV ASCU: PAP Smear Comment: 0

## 2016-05-08 LAB — NUSWAB VG+, CANDIDA 6SP
Candida albicans, NAA: POSITIVE — AB
Candida glabrata, NAA: NEGATIVE
Candida krusei, NAA: NEGATIVE
Candida lusitaniae, NAA: NEGATIVE
Candida parapsilosis, NAA: NEGATIVE
Candida tropicalis, NAA: POSITIVE — AB
Chlamydia trachomatis, NAA: NEGATIVE
Neisseria gonorrhoeae, NAA: NEGATIVE
Trich vag by NAA: NEGATIVE

## 2016-05-13 ENCOUNTER — Other Ambulatory Visit: Payer: Self-pay | Admitting: Obstetrics

## 2016-05-13 DIAGNOSIS — B379 Candidiasis, unspecified: Secondary | ICD-10-CM

## 2016-05-13 MED ORDER — FLUCONAZOLE 150 MG PO TABS: 150.0000 mg | ORAL_TABLET | Freq: Once | ORAL | Status: DC

## 2016-05-26 ENCOUNTER — Telehealth: Payer: Self-pay | Admitting: *Deleted

## 2016-05-26 NOTE — Telephone Encounter (Signed)
See phone note for this encounter. 

## 2016-06-02 ENCOUNTER — Encounter: Payer: Self-pay | Admitting: Certified Nurse Midwife

## 2016-06-02 ENCOUNTER — Ambulatory Visit (INDEPENDENT_AMBULATORY_CARE_PROVIDER_SITE_OTHER): Payer: BLUE CROSS/BLUE SHIELD | Admitting: Certified Nurse Midwife

## 2016-06-02 VITALS — BP 134/76 | HR 89 | Temp 98.5°F | Ht 65.0 in | Wt 198.0 lb

## 2016-06-02 DIAGNOSIS — L731 Pseudofolliculitis barbae: Secondary | ICD-10-CM | POA: Diagnosis not present

## 2016-06-02 DIAGNOSIS — L739 Follicular disorder, unspecified: Secondary | ICD-10-CM

## 2016-06-02 DIAGNOSIS — N909 Noninflammatory disorder of vulva and perineum, unspecified: Secondary | ICD-10-CM | POA: Diagnosis not present

## 2016-06-02 MED ORDER — MUPIROCIN 2 % EX OINT: 1.0000 "application " | TOPICAL_OINTMENT | Freq: Two times a day (BID) | CUTANEOUS | 0 refills | Status: DC

## 2016-06-02 MED ORDER — HYDROCORTISONE 1 % EX CREA: TOPICAL_CREAM | CUTANEOUS | 1 refills | Status: DC

## 2016-06-02 NOTE — Addendum Note (Signed)
Addended by: Marya Landry D on: 06/02/2016 05:17 PM   Modules accepted: Orders

## 2016-06-02 NOTE — Progress Notes (Signed)
Patient ID: MARAYA GWILLIAM, female   DOB: 01-19-1991, 25 y.o.   MRN: [id number]  Chief Complaint  Patient presents with  . Vaginal Itching    patient's son has ring worm, thinks she has ring worm on her vagina     HPI ANGELIA HAZELL is a 25 y.o. female.  Here d/t vaginal irritation towards her anal/vaginal area on the outside that started several weeks ago.  Denies pain or problems with urination.  Uses nuva ring for contraception, up to date on annual exam.  Recent hx of yeast infection.  Does shave her perineal area.  Discussed trimming pubic hair and not shaving.   HPI  Past Medical History:  Diagnosis Date  . Abnormal Pap smear    follow up was wnl  . Acne   . Hypotension    taken out of work r/t bp drops  . UTI (lower urinary tract infection)     Past Surgical History:  Procedure Laterality Date  . WISDOM TOOTH EXTRACTION      Family History  Problem Relation Age of Onset  . Hypertension Mother   . Diabetes Maternal Grandmother   . Cancer Maternal Grandfather   . Anesthesia problems Neg Hx   . Hypotension Neg Hx   . Pseudochol deficiency Neg Hx   . Malignant hyperthermia Neg Hx     Social History Social History  Substance Use Topics  . Smoking status: Current Some Day Smoker    Packs/day: 0.50    Types: Cigarettes  . Smokeless tobacco: Never Used  . Alcohol use No    Allergies  Allergen Reactions  . Amoxicillin Rash  . Prednisone Rash    Current Outpatient Prescriptions  Medication Sig Dispense Refill  . hydrocortisone cream 1 % Apply to affected area 2 times daily 30 g 1  . mupirocin ointment (BACTROBAN) 2 % Place 1 application into the nose 2 (two) times daily. 22 g 0   No current facility-administered medications for this visit.     Review of Systems Review of Systems Constitutional: negative for fatigue and weight loss Respiratory: negative for cough and wheezing Cardiovascular: negative for chest pain, fatigue and palpitations Gastrointestinal:  negative for abdominal pain and change in bowel habits Genitourinary:+ perineal irritation.   Integument/breast: negative for nipple discharge Musculoskeletal:negative for myalgias Neurological: negative for gait problems and tremors Behavioral/Psych: negative for abusive relationship, depression Endocrine: negative for temperature intolerance     Blood pressure 134/76, pulse 89, temperature 98.5 F (36.9 C), temperature source Oral, height  (1.651 m), weight 198 lb (89.8 kg), last menstrual period 05/23/2016, unknown if currently breastfeeding.  Physical Exam Physical Exam General:   alert  Skin:   no rash or abnormalities  Lungs:   clear to auscultation bilaterally  Heart:   regular rate and rhythm, S1, S2 normal, no murmur, click, rub or gallop  Breasts:   normal without suspicious masses, skin or nipple changes or axillary nodes  Abdomen:  normal findings: no organomegaly, soft, non-tender and no hernia  Pelvis:  External genitalia: normal general appearance, + 4 pinpoint follicles posterior of introitus towards the anus on the perineum; no pus, non-tender to palpation.   Urinary system: urethral meatus normal and bladder without fullness, nontender Vaginal: normal without tenderness, induration or masses     50% of 15 min visit spent on counseling and coordination of care.   Data Reviewed Previous medical hx, meds  Assessment     Perianal folliculitis most likely d/t shaving  Plan   Trim pubic hair, no shaving    Orders Placed This Encounter  Procedures  . Herpes simplex virus culture    Order Specific Question:   Source    Answer:   labia   Meds ordered this encounter  Medications  . hydrocortisone cream 1 %    Sig: Apply to affected area 2 times daily    Dispense:  30 g    Refill:  1  . mupirocin ointment (BACTROBAN) 2 %    Sig: Place 1 application into the nose 2 (two) times daily.    Dispense:  22 g    Refill:  0     Possible management options  include: biopsy, kenalog cream, triple antibiotic ointment, dermatology referral Follow up as needed.

## 2016-06-02 NOTE — Progress Notes (Signed)
Patient has vaginal itching, feels like it could be ring worm since her son currently has it.

## 2016-06-05 LAB — HERPES SIMPLEX VIRUS CULTURE

## 2016-06-06 LAB — NUSWAB VG+, CANDIDA 6SP
Candida albicans, NAA: POSITIVE — AB
Candida glabrata, NAA: NEGATIVE
Candida krusei, NAA: NEGATIVE
Candida lusitaniae, NAA: NEGATIVE
Candida parapsilosis, NAA: NEGATIVE
Candida tropicalis, NAA: NEGATIVE
Chlamydia trachomatis, NAA: NEGATIVE
Megasphaera 1: HIGH Score — AB
Neisseria gonorrhoeae, NAA: NEGATIVE
Trich vag by NAA: NEGATIVE

## 2016-06-07 ENCOUNTER — Other Ambulatory Visit: Payer: Self-pay | Admitting: Certified Nurse Midwife

## 2016-06-07 DIAGNOSIS — B373 Candidiasis of vulva and vagina: Secondary | ICD-10-CM

## 2016-06-07 DIAGNOSIS — N760 Acute vaginitis: Principal | ICD-10-CM

## 2016-06-07 DIAGNOSIS — B3731 Acute candidiasis of vulva and vagina: Secondary | ICD-10-CM

## 2016-06-07 DIAGNOSIS — B9689 Other specified bacterial agents as the cause of diseases classified elsewhere: Secondary | ICD-10-CM

## 2016-06-07 MED ORDER — TERCONAZOLE 0.8 % VA CREA: 1.0000 | TOPICAL_CREAM | Freq: Every day | VAGINAL | 0 refills | Status: DC

## 2016-06-07 MED ORDER — FLUCONAZOLE 100 MG PO TABS: 100.0000 mg | ORAL_TABLET | Freq: Once | ORAL | 0 refills | Status: AC

## 2016-06-07 MED ORDER — METRONIDAZOLE 500 MG PO TABS: 500.0000 mg | ORAL_TABLET | Freq: Two times a day (BID) | ORAL | 0 refills | Status: DC

## 2016-08-22 ENCOUNTER — Inpatient Hospital Stay (HOSPITAL_COMMUNITY): Admission: AD | Admit: 2016-08-22 | Payer: BLUE CROSS/BLUE SHIELD | Admitting: Family Medicine

## 2016-08-22 ENCOUNTER — Encounter: Payer: Self-pay | Admitting: *Deleted

## 2016-08-22 ENCOUNTER — Ambulatory Visit: Payer: BLUE CROSS/BLUE SHIELD | Admitting: *Deleted

## 2016-08-22 DIAGNOSIS — Z3A01 Less than 8 weeks gestation of pregnancy: Secondary | ICD-10-CM

## 2016-08-22 LAB — POCT PREGNANCY, URINE: Preg Test, Ur: POSITIVE — AB

## 2016-08-22 NOTE — Progress Notes (Signed)
Patient presents to clinic for pregnancy test which is positive. Patient given pregnancy verification letter, reviewed meds and allergies. Patient taking depakote and buspar, advised her to stop these meds and call her prescribing physician immediately to verify safety during pregnancy. Understanding voiced.

## 2016-09-05 ENCOUNTER — Ambulatory Visit: Payer: Self-pay | Admitting: Obstetrics

## 2016-09-17 ENCOUNTER — Inpatient Hospital Stay (HOSPITAL_COMMUNITY): Payer: Medicaid Other

## 2016-09-17 ENCOUNTER — Encounter (HOSPITAL_COMMUNITY): Payer: Self-pay | Admitting: *Deleted

## 2016-09-17 ENCOUNTER — Inpatient Hospital Stay (HOSPITAL_COMMUNITY)
Admission: AD | Admit: 2016-09-17 | Discharge: 2016-09-17 | Disposition: A | Discharge status: Home / Self Care | Payer: Medicaid Other | Source: Ambulatory Visit | Attending: Obstetrics and Gynecology | Admitting: Obstetrics and Gynecology

## 2016-09-17 DIAGNOSIS — O26891 Other specified pregnancy related conditions, first trimester: Secondary | ICD-10-CM

## 2016-09-17 DIAGNOSIS — Z8249 Family history of ischemic heart disease and other diseases of the circulatory system: Secondary | ICD-10-CM | POA: Insufficient documentation

## 2016-09-17 DIAGNOSIS — O99611 Diseases of the digestive system complicating pregnancy, first trimester: Secondary | ICD-10-CM | POA: Insufficient documentation

## 2016-09-17 DIAGNOSIS — Z888 Allergy status to other drugs, medicaments and biological substances status: Secondary | ICD-10-CM | POA: Diagnosis not present

## 2016-09-17 DIAGNOSIS — O26899 Other specified pregnancy related conditions, unspecified trimester: Secondary | ICD-10-CM

## 2016-09-17 DIAGNOSIS — K529 Noninfective gastroenteritis and colitis, unspecified: Secondary | ICD-10-CM

## 2016-09-17 DIAGNOSIS — F17210 Nicotine dependence, cigarettes, uncomplicated: Secondary | ICD-10-CM | POA: Diagnosis not present

## 2016-09-17 DIAGNOSIS — O99331 Smoking (tobacco) complicating pregnancy, first trimester: Secondary | ICD-10-CM | POA: Insufficient documentation

## 2016-09-17 DIAGNOSIS — O219 Vomiting of pregnancy, unspecified: Secondary | ICD-10-CM | POA: Diagnosis not present

## 2016-09-17 DIAGNOSIS — R109 Unspecified abdominal pain: Secondary | ICD-10-CM

## 2016-09-17 DIAGNOSIS — Z3A08 8 weeks gestation of pregnancy: Secondary | ICD-10-CM | POA: Insufficient documentation

## 2016-09-17 LAB — URINALYSIS, ROUTINE W REFLEX MICROSCOPIC
Bilirubin Urine: NEGATIVE
Glucose, UA: NEGATIVE mg/dL
Hgb urine dipstick: NEGATIVE
Ketones, ur: 15 mg/dL — AB
Leukocytes, UA: NEGATIVE
Nitrite: NEGATIVE
Protein, ur: NEGATIVE mg/dL
Specific Gravity, Urine: 1.02 (ref 1.005–1.030)
pH: 7.5 (ref 5.0–8.0)

## 2016-09-17 MED ORDER — PROMETHAZINE HCL 12.5 MG PO TABS: 12.5000 mg | ORAL_TABLET | Freq: Four times a day (QID) | ORAL | 0 refills | Status: DC | PRN

## 2016-09-17 MED ORDER — PROMETHAZINE HCL 25 MG/ML IJ SOLN: 25.0000 mg | Freq: Once | INTRAMUSCULAR | Status: DC

## 2016-09-17 MED ORDER — PROMETHAZINE HCL 25 MG/ML IJ SOLN
25.0000 mg | Freq: Once | INTRAMUSCULAR | Status: AC
  Administered 2016-09-17: 25 mg via INTRAMUSCULAR
  Filled 2016-09-17: qty 1

## 2016-09-17 NOTE — MAU Provider Note (Signed)
History     CSN: [id number]  Arrival date and time: 09/17/16 [id number]   First Provider Initiated Contact with Patient 09/17/16 1725      Chief Complaint  Patient presents with  . Abdominal Pain  . Emesis   HPI   [name] is a 25 y.o. female [phone] @ 2525w0d presenting to MAU with  abdominal cramping and nausea/ vomiting.  At midnight she started vomiting and has vomited 4 x since then. She has not taken anything for the nausea/ vomiting. She has been able to keep down chicken noodle soup and some oral hydration. She also reports 8 episodes of loose stool.  No sick contacts.  She is complaining of bilateral lower abdominal pain that started last night around midnight.  She denies vaginal bleeding.  OB History    Gravida Para Term Preterm AB Living   5 2 2   1 2    SAB TAB Ectopic Multiple Live Births   1       2      Past Medical History:  Diagnosis Date  . Abnormal Pap smear    follow up was wnl  . Acne   . Hypotension    taken out of work r/t bp drops  . UTI (lower urinary tract infection)     Past Surgical History:  Procedure Laterality Date  . WISDOM TOOTH EXTRACTION      Family History  Problem Relation Age of Onset  . Hypertension Mother   . Diabetes Maternal Grandmother   . Cancer Maternal Grandfather   . Anesthesia problems Neg Hx   . Hypotension Neg Hx   . Pseudochol deficiency Neg Hx   . Malignant hyperthermia Neg Hx     Social History  Substance Use Topics  . Smoking status: Current Some Day Smoker    Packs/day: 0.50    Types: Cigarettes  . Smokeless tobacco: Never Used  . Alcohol use No    Allergies:  Allergies  Allergen Reactions  . Amoxicillin Rash  . Prednisone Rash    Prescriptions Prior to Admission  Medication Sig Dispense Refill Last Dose  . busPIRone (BUSPAR) 10 MG tablet Take 3 mg by mouth 3 (three) times daily.   Taking  . divalproex (DEPAKOTE) 500 MG DR tablet Take 500 mg by mouth 3 (three) times daily.   Taking  .  hydrocortisone cream 1 % Apply to affected area 2 times daily (Patient not taking: Reported on 08/22/2016) 30 g 1 Not Taking  . metroNIDAZOLE (FLAGYL) 500 MG tablet Take 1 tablet (500 mg total) by mouth 2 (two) times daily. (Patient not taking: Reported on 08/22/2016) 14 tablet 0 Not Taking  . mupirocin ointment (BACTROBAN) 2 % Place 1 application into the nose 2 (two) times daily. (Patient not taking: Reported on 08/22/2016) 22 g 0 Not Taking  . terconazole (TERAZOL 3) 0.8 % vaginal cream Place 1 applicator vaginally at bedtime. (Patient not taking: Reported on 08/22/2016) 20 g 0 Not Taking   Results for orders placed or performed during the hospital encounter of 09/17/16 (from the past 48 hour(s))  Urinalysis, Routine w reflex microscopic ([facility])     Status: Abnormal   Collection Time: 09/17/16  4:41 PM  Result Value Ref Range   Color, Urine YELLOW YELLOW   APPearance CLEAR CLEAR   Specific Gravity, Urine 1.020 1.005 - 1.030   pH 7.5 5.0 - 8.0   Glucose, UA NEGATIVE NEGATIVE mg/dL   Hgb urine dipstick NEGATIVE NEGATIVE  Bilirubin Urine NEGATIVE NEGATIVE   Ketones, ur 15 (A) NEGATIVE mg/dL   Protein, ur NEGATIVE NEGATIVE mg/dL   Nitrite NEGATIVE NEGATIVE   Leukocytes, UA NEGATIVE NEGATIVE    Comment: MICROSCOPIC NOT DONE ON URINES WITH NEGATIVE PROTEIN, BLOOD, LEUKOCYTES, NITRITE, OR GLUCOSE <1000 mg/dL.    [place] Ob Comp Less 14 Wks  Result Date: 09/17/2016 CLINICAL DATA:  Abdominal pain in the first trimester pregnancy EXAM: OBSTETRIC <14 WK US AND TRANSVAGINAL OB US TECHNIQUE: Both transabdominal and transvaginal ultrasound examinations were performed for complete evaluation of the gestation as well as the maternal uterus, adnexal regions, and pelvic cul-de-sac. Transvaginal technique was performed to assess early pregnancy. COMPARISON:  None for this pregnancy FINDINGS: Intrauterine gestational sac: Single Yolk sac:  Visualized Embryo:  Visualized Cardiac Activity: Visualized Heart  Rate: 161  bpm CRL:  8.8  mm   6 w   5 d                  US EDC: 05/08/2017 Subchorionic hemorrhage:  Small Maternal uterus/adnexae: Normal IMPRESSION: Single live intrauterine gestation 6 weeks 5 days with heart rate of 161 beats per minute. Electronically Signed   By: Tollie Ethavid  Kwon M.D.   On: 09/17/2016 18:18   [place] Ob Transvaginal  Result Date: 09/17/2016 CLINICAL DATA:  Abdominal pain in the first trimester pregnancy EXAM: OBSTETRIC <14 WK US AND TRANSVAGINAL OB US TECHNIQUE: Both transabdominal and transvaginal ultrasound examinations were performed for complete evaluation of the gestation as well as the maternal uterus, adnexal regions, and pelvic cul-de-sac. Transvaginal technique was performed to assess early pregnancy. COMPARISON:  None for this pregnancy FINDINGS: Intrauterine gestational sac: Single Yolk sac:  Visualized Embryo:  Visualized Cardiac Activity: Visualized Heart Rate: 161  bpm CRL:  8.8  mm   6 w   5 d                  US EDC: 05/08/2017 Subchorionic hemorrhage:  Small Maternal uterus/adnexae: Normal IMPRESSION: Single live intrauterine gestation 6 weeks 5 days with heart rate of 161 beats per minute. Electronically Signed   By: Tollie Ethavid  Kwon M.D.   On: 09/17/2016 18:18   Review of Systems  Constitutional: Negative for chills and fever.  Gastrointestinal: Positive for diarrhea, nausea and vomiting.   Physical Exam   Blood pressure 108/75, pulse 87, temperature 98.3 F (36.8 C), temperature source Oral, resp. rate 16, last menstrual period 07/23/2016, SpO2 99 %, unknown if currently breastfeeding.  Physical Exam  Constitutional: She is oriented to person, place, and time. She appears well-developed and well-nourished. No distress.  HENT:  Head: Normocephalic.  Respiratory: Effort normal.  GI: Soft. She exhibits no distension. There is no tenderness. There is no rebound.  Musculoskeletal: Normal range of motion.  Neurological: She is alert and oriented to person, place, and  time.  Skin: Skin is warm. She is not diaphoretic.  Psychiatric: Her behavior is normal.   MAU Course  Procedures  None  MDM  Urine shows mild dehydration  Phenergan 25 mg IM X 1 US to evaluate viability  Patient tolerating PO fluids   Assessment and Plan   A:  1. Gastroenteritis   2. Abdominal pain in pregnancy, antepartum   3. Nausea and vomiting in pregnancy     P:  Discharge home in stable condition Rx: Phenergan BRAT diet Increase PO fluid hydration as tolerated at home.  Return to MAU if symptoms worsen  Duane LopeJennifer I Rasch, NP 09/19/2016 8:15 AM

## 2016-09-17 NOTE — MAU Note (Signed)
Pt states she has had nausea all pregnancy but around 0000 she started throwing up.  Pt states last night she felt like her skin is on fire.  Pt states she is having lower abdominal cramping that also started last night.

## 2016-09-17 NOTE — Discharge Instructions (Signed)
Norovirus Infection °A norovirus infection is caused by exposure to a virus in a group of similar viruses (noroviruses). This type of infection causes inflammation in your stomach and intestines (gastroenteritis). Norovirus is the most common cause of gastroenteritis. It also causes food poisoning. °Anyone can get a norovirus infection. It spreads very easily (contagious). You can get it from contaminated food, water, surfaces, or other people. Norovirus is found in the stool or vomit of infected people. You can spread the infection as soon as you feel sick until 2 weeks after you recover.  °Symptoms usually begin within 2 days after you become infected. Most norovirus symptoms affect the digestive system. °CAUSES °Norovirus infection is caused by contact with norovirus. You can catch norovirus if you: °· Eat or drink something contaminated with norovirus. °· Touch surfaces or objects contaminated with norovirus and then put your hand in your mouth. °· Have direct contact with an infected person who has symptoms. °· Share food, drink, or utensils with someone with who is sick with norovirus. °SIGNS AND SYMPTOMS °Symptoms of norovirus may include: °· Nausea. °· Vomiting. °· Diarrhea. °· Stomach cramps. °· Fever. °· Chills. °· Headache. °· Muscle aches. °· Tiredness. °DIAGNOSIS °Your health care provider may suspect norovirus based on your symptoms and physical exam. Your health care provider may also test a sample of your stool or vomit for the virus.  °TREATMENT °There is no specific treatment for norovirus. Most people get better without treatment in about 2 days. °HOME CARE INSTRUCTIONS °· Replace lost fluids by drinking plenty of water or rehydration fluids containing important minerals called electrolytes. This prevents dehydration. Drink enough fluid to keep your urine clear or pale yellow. °· Do not prepare food for others while you are infected. Wait at least 3 days after recovering from the illness to do  that. °PREVENTION  °· Wash your hands often, especially after using the toilet or changing a diaper. °· Wash fruits and vegetables thoroughly before preparing or serving them. °· Throw out any food that a sick person may have touched. °· Disinfect contaminated surfaces immediately after someone in the household has been sick. Use a bleach-based household cleaner. °· Immediately remove and wash soiled clothes or sheets. °SEEK MEDICAL CARE IF: °· Your vomiting, diarrhea, and stomach pain is getting worse. °· Your symptoms of norovirus do not go away after 2-3 days. °SEEK IMMEDIATE MEDICAL CARE IF:  °You develop symptoms of dehydration that do not improve with fluid replacement. This may include: °· Excessive sleepiness. °· Lack of tears. °· Dry mouth. °· Dizziness when standing. °· Weak pulse. °  °This information is not intended to replace advice given to you by your health care provider. Make sure you discuss any questions you have with your health care provider. °  °Document Released: 01/14/2003 Document Revised: 11/14/2014 Document Reviewed: 04/03/2014 °Elsevier Interactive Patient Education ©2016 Elsevier Inc. ° °

## 2016-09-25 ENCOUNTER — Inpatient Hospital Stay (HOSPITAL_COMMUNITY)
Admission: AD | Admit: 2016-09-25 | Discharge: 2016-09-25 | Disposition: A | Discharge status: Home / Self Care | Payer: Medicaid Other | Source: Ambulatory Visit | Attending: Obstetrics & Gynecology | Admitting: Obstetrics & Gynecology

## 2016-09-25 ENCOUNTER — Encounter: Payer: Self-pay | Admitting: Medical

## 2016-09-25 DIAGNOSIS — Z3A01 Less than 8 weeks gestation of pregnancy: Secondary | ICD-10-CM | POA: Insufficient documentation

## 2016-09-25 DIAGNOSIS — Z8249 Family history of ischemic heart disease and other diseases of the circulatory system: Secondary | ICD-10-CM | POA: Insufficient documentation

## 2016-09-25 DIAGNOSIS — F17210 Nicotine dependence, cigarettes, uncomplicated: Secondary | ICD-10-CM | POA: Insufficient documentation

## 2016-09-25 DIAGNOSIS — O219 Vomiting of pregnancy, unspecified: Secondary | ICD-10-CM

## 2016-09-25 DIAGNOSIS — Z888 Allergy status to other drugs, medicaments and biological substances status: Secondary | ICD-10-CM | POA: Insufficient documentation

## 2016-09-25 DIAGNOSIS — O99331 Smoking (tobacco) complicating pregnancy, first trimester: Secondary | ICD-10-CM | POA: Diagnosis not present

## 2016-09-25 DIAGNOSIS — R112 Nausea with vomiting, unspecified: Secondary | ICD-10-CM | POA: Diagnosis present

## 2016-09-25 LAB — URINALYSIS, ROUTINE W REFLEX MICROSCOPIC
Bilirubin Urine: NEGATIVE
Glucose, UA: NEGATIVE mg/dL
Hgb urine dipstick: NEGATIVE
Ketones, ur: NEGATIVE mg/dL
Leukocytes, UA: NEGATIVE
Nitrite: NEGATIVE
Protein, ur: NEGATIVE mg/dL
Specific Gravity, Urine: 1.025 (ref 1.005–1.030)
pH: 6.5 (ref 5.0–8.0)

## 2016-09-25 MED ORDER — METOCLOPRAMIDE HCL 10 MG PO TABS: 10.0000 mg | ORAL_TABLET | Freq: Four times a day (QID) | ORAL | 0 refills | Status: DC

## 2016-09-25 MED ORDER — PROMETHAZINE HCL 25 MG PO TABS: 25.0000 mg | ORAL_TABLET | Freq: Four times a day (QID) | ORAL | 0 refills | Status: DC | PRN

## 2016-09-25 NOTE — MAU Note (Signed)
Unable to keep stuff outside of crackers, drinks down x 1 week .  [facility] [place].

## 2016-09-25 NOTE — Discharge Instructions (Signed)
For the nausea you can take an over the counter medication known as UNISOM in combination with VITAMIN B6 50mg  twice a day. This is the same as a prescription medication known as Diclegis, and is the only known safe medication to take in early pregnancy. There are other medications available if this does now work. However, those medications are not known to be as safe.    Eating Plan for Nausea and Vomiting in Pregnancy Hyperemesis gravidarum is a severe form of morning sickness. Because this condition causes severe nausea and vomiting, it can lead to dehydration, malnutrition, and weight loss. One way to lessen the symptoms of nausea and vomiting is to follow the eating plan for hyperemesis gravidarum. It is often used along with prescribed medicines to control your symptoms. What can I do to relieve my symptoms? Listen to your body. Everyone is different and has different preferences. Find what works best for you. Take any of the following actions that are helpful to you:  Eat and drink slowly.  Eat 5-6 small meals daily instead of 3 large meals.  Eat crackers before you get out of bed in the morning.  Try having a snack in the middle of the night.  Starchy foods are usually tolerated well. Examples include cereal, toast, bread, potatoes, pasta, rice, and pretzels.  Ginger may help with nausea. Add  tsp ground ginger to hot tea or choose ginger tea.  Try drinking 100% fruit juice or an electrolyte drink. An electrolyte drink contains sodium, potassium, and chloride.  Continue to take your prenatal vitamins as told by your health care provider. If you are having trouble taking your prenatal vitamins, talk with your health care provider about different options.  Include at least 1 serving of protein with your meals and snacks. Protein options include meats or poultry, beans, nuts, eggs, and yogurt. Try eating a protein-rich snack before bed. Examples of these snacks include cheese and  crackers or half of a peanut butter or [place] sandwich.  Consider eliminating foods that trigger your symptoms. These may include spicy foods, coffee, high-fat foods, very sweet foods, and acidic foods.  Try meals that have more protein combined with bland, salty, lower-fat, and dry foods, such as nuts, seeds, pretzels, crackers, and cereal.  Talk with your healthcare provider about starting a supplement of vitamin B6.  Have fluids that are cold, clear, and carbonated or sour. Examples include lemonade, ginger ale, lemon-lime soda, ice water, and sparkling water.  Try lemon or mint tea.  Try brushing your teeth or using a mouth rinse after meals. What should I avoid to reduce my symptoms? Avoiding some of the following things may help reduce your symptoms.  Foods with strong smells. Try eating meals in well-ventilated areas that are free of odors.  Drinking water or other beverages with meals. Try not to drink anything during the 30 minutes before and after your meals.  Drinking more than 1 cup of fluid at a time. Sometimes using a straw helps.  Fried or high-fat foods, such as butter and cream sauces.  Spicy foods.  Skipping meals as best as you can. Nausea can be more intense on an empty stomach. If you cannot tolerate food at that time, do not force it. Try sucking on ice chips or other frozen items, and make up for missed calories later.  Lying down within 2 hours after eating.  Environmental triggers. These may include smoky rooms, closed spaces, rooms with strong smells, warm or humid places,  overly loud and noisy rooms, and rooms with motion or flickering lights.  Quick and sudden changes in your movement. This information is not intended to replace advice given to you by your health care provider. Make sure you discuss any questions you have with your health care provider. Document Released: 08/21/2007 Document Revised: 06/22/2016 Document Reviewed: 05/24/2016 Elsevier  Interactive Patient Education  2017 Elsevier Inc. Morning Sickness Morning sickness is when you feel sick to your stomach (nauseous) during pregnancy. You may feel sick to your stomach and throw up (vomit). You may feel sick in the morning, but you can feel this way any time of day. Some women feel very sick to their stomach and cannot stop throwing up (hyperemesis gravidarum). Follow these instructions at home:  Only take medicines as told by your doctor.  Take multivitamins as told by your doctor. Taking multivitamins before getting pregnant can stop or lessen the harshness of morning sickness.  Eat dry toast or unsalted crackers before getting out of bed.  Eat 5 to 6 small meals a day.  Eat dry and bland foods like rice and baked potatoes.  Do not drink liquids with meals. Drink between meals.  Do not eat greasy, fatty, or spicy foods.  Have someone cook for you if the smell of food causes you to feel sick or throw up.  If you feel sick to your stomach after taking prenatal vitamins, take them at night or with a snack.  Eat protein when you need a snack (nuts, yogurt, cheese).  Eat unsweetened gelatins for dessert.  Wear a bracelet used for sea sickness (acupressure wristband).  Go to a doctor that puts thin needles into certain body points (acupuncture) to improve how you feel.  Do not smoke.  Use a humidifier to keep the air in your house free of odors.  Get lots of fresh air. Contact a doctor if:  You need medicine to feel better.  You feel dizzy or lightheaded.  You are losing weight. Get help right away if:  You feel very sick to your stomach and cannot stop throwing up.  You pass out (faint). This information is not intended to replace advice given to you by your health care provider. Make sure you discuss any questions you have with your health care provider. Document Released: 12/01/2004 Document Revised: 03/31/2016 Document Reviewed: 04/10/2013 Elsevier  Interactive Patient Education  2017 [employer]

## 2016-09-25 NOTE — MAU Provider Note (Signed)
History     CSN: [id number]  Arrival date and time: 09/25/16 1149   First Provider Initiated Contact with Patient 09/25/16 1218      Chief Complaint  Patient presents with  . Morning Sickness   Pt has had worsening nausea/vomiting for the past four weeks. This past week she states she has been unable to keep anything down other than crackers, plain potato chips, and clear liquids. Pt denies HA, CP, SOB, abd pain, diarrhea or constipation, urinary symptoms, or vaginal discharge or bleeding. Pt had mild morning sickness with her first pregnancy, but states that this is much worse.  Pt would like something for nausea    OB History    Gravida Para Term Preterm AB Living   4 2 2   1 2    SAB TAB Ectopic Multiple Live Births   1       2      Past Medical History:  Diagnosis Date  . Abnormal Pap smear    follow up was wnl  . Acne   . Hypotension    taken out of work r/t bp drops  . UTI (lower urinary tract infection)     Past Surgical History:  Procedure Laterality Date  . WISDOM TOOTH EXTRACTION      Family History  Problem Relation Age of Onset  . Hypertension Mother   . Diabetes Maternal Grandmother   . Cancer Maternal Grandfather   . Anesthesia problems Neg Hx   . Hypotension Neg Hx   . Pseudochol deficiency Neg Hx   . Malignant hyperthermia Neg Hx     Social History  Substance Use Topics  . Smoking status: Current Some Day Smoker    Packs/day: 0.50    Types: Cigarettes  . Smokeless tobacco: Never Used  . Alcohol use No    Allergies:  Allergies  Allergen Reactions  . Amoxicillin Rash and Other (See Comments)    Has patient had a PCN reaction causing immediate rash, facial/tongue/throat swelling, SOB or lightheadedness with hypotension: No Has patient had a PCN reaction causing severe rash involving mucus membranes or skin necrosis: No Has patient had a PCN reaction that required hospitalization No Has patient had a PCN reaction occurring within the  last 10 years: No If all of the above answers are "NO", then may proceed with Cephalosporin use.  . Prednisone Rash    Prescriptions Prior to Admission  Medication Sig Dispense Refill Last Dose  . Prenat-FeAsp-Meth-FA-DHA w/o A (PRENATE PIXIE) 10-0.6-0.4-200 MG CAPS Take 1 capsule by mouth at bedtime.   09/24/2016 at Unknown time    Review of Systems  Constitutional: Negative for chills, fever and weight loss.  Respiratory: Negative for cough and shortness of breath.   Cardiovascular: Negative for chest pain.  Gastrointestinal: Positive for nausea and vomiting. Negative for abdominal pain, constipation and diarrhea.  Genitourinary: Negative for dysuria, frequency and urgency.   Physical Exam   Blood pressure 134/72, pulse 93, temperature 98.6 F (37 C), temperature source Oral, resp. rate 16, height 5\' 6"  (1.676 m), weight 93.4 kg (205 lb 12.8 oz), last menstrual period 07/23/2016, unknown if currently breastfeeding.  Physical Exam  Constitutional: She is oriented to person, place, and time. She appears well-developed and well-nourished. No distress.  Cardiovascular: Normal rate.   Respiratory: Effort normal.  GI: Soft. Bowel sounds are normal. She exhibits no distension. There is no tenderness. There is no rebound.  Musculoskeletal: Normal range of motion.  Neurological: She is alert and oriented  to person, place, and time.  Skin: Skin is warm and dry.  Psychiatric: She has a normal mood and affect. Her behavior is normal.    MAU Course  Procedures  MDM Pt with morning sickness. Urinalysis negative for UTI, dehydration. Discharge to home with prescription for Reglan.   Assessment and Plan  1. Nausea/vomiting in pregnancy  -- Prescribe Reglan  Discharge to home  Victoria Wright 09/25/2016, 12:36 PM

## 2016-09-25 NOTE — MAU Provider Note (Signed)
History     CSN: [id number]  Arrival date and time: 09/25/16 1149   First Provider Initiated Contact with Patient 09/25/16 1218      Chief Complaint  Patient presents with  . Morning Sickness   HPI Ms. Victoria Wright is a 25 y.o. [id number] at 42107w6d who presents to MAU today with complaint of N/V. The patient states this has been worsening over the last week. She is able to tolerate liquids, crackers and chips, but vomits with more intake. She denies abdominal pain, vaginal bleeding or fever. She has not tried any medications for her symptoms. She plans to [facility] [place] next month.    OB History    Gravida Para Term Preterm AB Living   4 2 2   1 2    SAB TAB Ectopic Multiple Live Births   1       2      Past Medical History:  Diagnosis Date  . Abnormal Pap smear    follow up was wnl  . Acne   . Hypotension    taken out of work r/t bp drops  . UTI (lower urinary tract infection)     Past Surgical History:  Procedure Laterality Date  . WISDOM TOOTH EXTRACTION      Family History  Problem Relation Age of Onset  . Hypertension Mother   . Diabetes Maternal Grandmother   . Cancer Maternal Grandfather   . Anesthesia problems Neg Hx   . Hypotension Neg Hx   . Pseudochol deficiency Neg Hx   . Malignant hyperthermia Neg Hx     Social History  Substance Use Topics  . Smoking status: Current Some Day Smoker    Packs/day: 0.50    Types: Cigarettes  . Smokeless tobacco: Never Used  . Alcohol use No    Allergies:  Allergies  Allergen Reactions  . Amoxicillin Rash and Other (See Comments)    Has patient had a PCN reaction causing immediate rash, facial/tongue/throat swelling, SOB or lightheadedness with hypotension: No Has patient had a PCN reaction causing severe rash involving mucus membranes or skin necrosis: No Has patient had a PCN reaction that required hospitalization No Has patient had a PCN reaction occurring within the last 10 years:  No If all of the above answers are "NO", then may proceed with Cephalosporin use.  . Prednisone Rash    Prescriptions Prior to Admission  Medication Sig Dispense Refill Last Dose  . Prenat-FeAsp-Meth-FA-DHA w/o A (PRENATE PIXIE) 10-0.6-0.4-200 MG CAPS Take 1 capsule by mouth at bedtime.   09/24/2016 at Unknown time    Review of Systems  Constitutional: Negative for fever and malaise/fatigue.  Gastrointestinal: Positive for nausea and vomiting. Negative for abdominal pain, constipation, diarrhea and heartburn.  Genitourinary:       Neg - vaginal bleeding   Physical Exam   Blood pressure 134/72, pulse 93, temperature 98.6 F (37 C), temperature source Oral, resp. rate 16, height 5\' 6"  (1.676 m), weight 205 lb 12.8 oz (93.4 kg), last menstrual period 07/23/2016, unknown if currently breastfeeding.  Physical Exam  Nursing note and vitals reviewed. Constitutional: She is oriented to person, place, and time. She appears well-developed and well-nourished. No distress.  HENT:  Head: Normocephalic and atraumatic.  Cardiovascular: Normal rate.   Respiratory: Effort normal.  GI: Soft. She exhibits no distension.  Neurological: She is alert and oriented to person, place, and time.  Skin: Skin is warm and dry. No erythema.  Psychiatric:  She has a normal mood and affect.    Results for orders placed or performed during the hospital encounter of 09/25/16 (from the past 24 hour(s))  Urinalysis, Routine w reflex microscopic (not at [facility])     Status: None   Collection Time: 09/25/16 11:59 AM  Result Value Ref Range   Color, Urine YELLOW YELLOW   APPearance CLEAR CLEAR   Specific Gravity, Urine 1.025 1.005 - 1.030   pH 6.5 5.0 - 8.0   Glucose, UA NEGATIVE NEGATIVE mg/dL   Hgb urine dipstick NEGATIVE NEGATIVE   Bilirubin Urine NEGATIVE NEGATIVE   Ketones, ur NEGATIVE NEGATIVE mg/dL   Protein, ur NEGATIVE NEGATIVE mg/dL   Nitrite NEGATIVE NEGATIVE   Leukocytes, UA NEGATIVE NEGATIVE     MAU Course  Procedures None  MDM UA today without evidence of dehydration Patient offered a trial of PO medication in MAU vs at home. She would prefer Rx to trial at home at this time. No emesis while in MAU today.   Assessment and Plan  A: SIUP at 6250w6d Nausea and vomiting in pregnancy prior to [redacted] weeks gestation   P: Discharge home Rx for Phenergan and Reglan given to patient. Advised to trial separately and not take together.  Instructions for Unisom/B6 also given First trimester precautions and diet for N/V in pregnancy discussed and included on AVS Patient advised to [facility] [place] as planned to start prenatal care  Patient may return to MAU as needed or if her condition were to change or worsen   Marny LowensteinJulie N Nazli Penn, PA-C  09/25/2016, 12:51 PM

## 2017-05-02 ENCOUNTER — Ambulatory Visit (INDEPENDENT_AMBULATORY_CARE_PROVIDER_SITE_OTHER): Payer: Medicaid Other

## 2017-05-02 DIAGNOSIS — Z3201 Encounter for pregnancy test, result positive: Secondary | ICD-10-CM | POA: Diagnosis not present

## 2017-05-02 DIAGNOSIS — N912 Amenorrhea, unspecified: Secondary | ICD-10-CM | POA: Diagnosis not present

## 2017-05-02 LAB — POCT URINE PREGNANCY: Preg Test, Ur: POSITIVE — AB

## 2017-05-02 NOTE — Progress Notes (Signed)
Pt presents for pregnancy test. Pt start of last cycle was 03/20/17. EDD 12/25/2017. Pt is 6 weeks 1 day according to LMP. Pt advised to schedule new OB around 06/05/17 which will put pt at 11 weeks.

## 2017-05-10 ENCOUNTER — Encounter (HOSPITAL_COMMUNITY): Payer: Self-pay | Admitting: *Deleted

## 2017-05-10 ENCOUNTER — Ambulatory Visit (HOSPITAL_COMMUNITY)
Admission: EM | Admit: 2017-05-10 | Discharge: 2017-05-10 | Disposition: A | Discharge status: Home / Self Care | Payer: Medicaid Other | Attending: Family Medicine | Admitting: Family Medicine

## 2017-05-10 DIAGNOSIS — H60391 Other infective otitis externa, right ear: Secondary | ICD-10-CM | POA: Diagnosis not present

## 2017-05-10 MED ORDER — NEOMYCIN-POLYMYXIN-HC 3.5-10000-1 OT SUSP: 4.0000 [drp] | Freq: Three times a day (TID) | OTIC | 0 refills | Status: DC

## 2017-05-10 MED ORDER — ACETAMINOPHEN 500 MG PO TABS: 500.0000 mg | ORAL_TABLET | Freq: Four times a day (QID) | ORAL | 0 refills | Status: DC | PRN

## 2017-05-10 NOTE — ED Provider Notes (Signed)
CSN: [id number]     Arrival date & time 05/10/17  1505 History   None    Chief Complaint  Patient presents with  . Otalgia   (Consider location/radiation/quality/duration/timing/severity/associated sxs/prior Treatment) 26 yo female comes in with 3 day history of right ear pain with change in hearing. Patient states it started with pain in right ear, sharp and throbbing. However, now with some right cheek pain and dental pain. She has not been swimming. Does regularly use cotton swabs and scratch ear with fingers. Denies fever, chills, night sweats. Denies URI symptoms such has cough, nasal congestion, sinus pressure. Took some tylenol which helped with the pain.       Past Medical History:  Diagnosis Date  . Abnormal Pap smear    follow up was wnl  . Acne   . Hypotension    taken out of work r/t bp drops  . UTI (lower urinary tract infection)    Past Surgical History:  Procedure Laterality Date  . WISDOM TOOTH EXTRACTION     Family History  Problem Relation Age of Onset  . Hypertension Mother   . Diabetes Maternal Grandmother   . Cancer Maternal Grandfather   . Anesthesia problems Neg Hx   . Hypotension Neg Hx   . Pseudochol deficiency Neg Hx   . Malignant hyperthermia Neg Hx    Social History  Substance Use Topics  . Smoking status: Current Some Day Smoker    Packs/day: 0.50    Types: Cigarettes  . Smokeless tobacco: Never Used  . Alcohol use No   OB History    Gravida Para Term Preterm AB Living   5 2 2   1 2    SAB TAB Ectopic Multiple Live Births   1       2     Review of Systems  Constitutional: Negative for chills, diaphoresis and fever.  HENT: Positive for ear pain. Negative for congestion, ear discharge, postnasal drip, rhinorrhea, sinus pain, sinus pressure, sore throat and trouble swallowing.   Eyes: Negative for pain, discharge, redness and itching.  Respiratory: Negative for cough, shortness of breath and wheezing.   Cardiovascular: Negative for  chest pain and palpitations.  Skin: Negative for rash.    Allergies  Amoxicillin and Prednisone  Home Medications   Prior to Admission medications   Medication Sig Start Date End Date Taking? Authorizing Provider  acetaminophen (TYLENOL) 500 MG tablet Take 1 tablet (500 mg total) by mouth every 6 (six) hours as needed. 05/10/17   Cathie Hoops, Jerelle Virden V, PA-C  metoCLOPramide (REGLAN) 10 MG tablet Take 1 tablet (10 mg total) by mouth every 6 (six) hours. 09/25/16   Marny Lowenstein, PA-C  neomycin-polymyxin-hydrocortisone (CORTISPORIN) 3.5-10000-1 OTIC suspension Place 4 drops into the right ear 3 (three) times daily. 05/10/17   Nahom Carfagno V, PA-C  Prenat-FeAsp-Meth-FA-DHA w/o A (PRENATE PIXIE) 10-0.6-0.4-200 MG CAPS Take 1 capsule by mouth at bedtime.    [provider]  promethazine (PHENERGAN) 25 MG tablet Take 1 tablet (25 mg total) by mouth every 6 (six) hours as needed for nausea or vomiting. 09/25/16   Marny Lowenstein, PA-C   Meds Ordered and Administered this Visit  Medications - No data to display  BP 132/78 (BP Location: Right Arm)   Pulse 78   Temp 98.6 F (37 C) (Oral)   Resp 18   LMP 03/20/2017 (LMP Unknown)   SpO2 100%  No data found.   Physical Exam  Constitutional: She is oriented to  person, place, and time. She appears well-developed and well-nourished. No distress.  HENT:  Head: Normocephalic and atraumatic.  Nose: Nose normal. Right sinus exhibits no maxillary sinus tenderness and no frontal sinus tenderness. Left sinus exhibits no maxillary sinus tenderness and no frontal sinus tenderness.  Mouth/Throat: Uvula is midline, oropharynx is clear and moist and mucous membranes are normal.  Left ear with out swelling, erythema, increased warmth. No tenderness on palpation of the tragus. External and canal normal. TM normal and pearly grey.   Right ear with swelling of the ear canal. No erythema, increased warmth, wound noted. Moderate tenderness on palpation of the tragus. No  discharge noted. Unable to view whole TM, no perforation of the TM that was visible, pearly grey.   Neck: Normal range of motion. Neck supple.  Cardiovascular: Normal rate, regular rhythm and normal heart sounds.  Exam reveals no gallop.   No murmur heard. Pulmonary/Chest: Effort normal and breath sounds normal. She has no wheezes. She has no rales.  Lymphadenopathy:    She has no cervical adenopathy.  Neurological: She is alert and oriented to person, place, and time.  Skin: Skin is warm and dry.  Psychiatric: She has a normal mood and affect. Her behavior is normal. Judgment normal.    Urgent Care Course     Procedures (including critical care time)  Labs Review Labs Reviewed - No data to display  Imaging Review No results found.       MDM   1. Other infective acute otitis externa of right ear    Discussed with patient history and exam most consistent with otitis externa. Start Cortisporin 4 drops into right ear TID for 7 days.  Discussed with patient hearing loss most likely due to swelling of the ear canal. Patient to return for re-evaluation if hearing does not improve. Patient to take Tylenol PRN for pain.   Case discussed with Dr Tracie HarrierHagler due to history of allergic reaction to Prednisone and patient's pregnancy status. Will proceed with treatment due to topical short term use of medicine. Discussed with patient, who expresses understanding and agrees with plan.    Belinda FisherYu, Margreat Widener V, PA-C 05/10/17 1614

## 2017-05-10 NOTE — ED Triage Notes (Addendum)
Pt  Reports  r  Earache   X   3  Days    With pain  Radiating  r   Side  Of  Face      Pt  Is     Pregnant

## 2017-05-26 ENCOUNTER — Inpatient Hospital Stay (HOSPITAL_COMMUNITY)
Admission: AD | Admit: 2017-05-26 | Discharge: 2017-05-26 | Disposition: A | Discharge status: Home / Self Care | Payer: Medicaid Other | Source: Ambulatory Visit | Attending: Obstetrics & Gynecology | Admitting: Obstetrics & Gynecology

## 2017-05-26 ENCOUNTER — Inpatient Hospital Stay (HOSPITAL_COMMUNITY): Payer: Medicaid Other

## 2017-05-26 DIAGNOSIS — O99331 Smoking (tobacco) complicating pregnancy, first trimester: Secondary | ICD-10-CM | POA: Diagnosis not present

## 2017-05-26 DIAGNOSIS — R109 Unspecified abdominal pain: Secondary | ICD-10-CM | POA: Diagnosis not present

## 2017-05-26 DIAGNOSIS — Z3A09 9 weeks gestation of pregnancy: Secondary | ICD-10-CM | POA: Diagnosis not present

## 2017-05-26 DIAGNOSIS — O219 Vomiting of pregnancy, unspecified: Secondary | ICD-10-CM

## 2017-05-26 DIAGNOSIS — F17210 Nicotine dependence, cigarettes, uncomplicated: Secondary | ICD-10-CM | POA: Diagnosis not present

## 2017-05-26 DIAGNOSIS — O26891 Other specified pregnancy related conditions, first trimester: Secondary | ICD-10-CM | POA: Diagnosis not present

## 2017-05-26 DIAGNOSIS — O210 Mild hyperemesis gravidarum: Secondary | ICD-10-CM | POA: Diagnosis not present

## 2017-05-26 LAB — CBC WITH DIFFERENTIAL/PLATELET
Basophils Absolute: 0 10*3/uL (ref 0.0–0.1)
Basophils Relative: 0 %
Eosinophils Absolute: 0.1 10*3/uL (ref 0.0–0.7)
Eosinophils Relative: 2 %
HCT: 34.7 % — ABNORMAL LOW (ref 36.0–46.0)
Hemoglobin: 12 g/dL (ref 12.0–15.0)
Lymphocytes Relative: 29 %
Lymphs Abs: 2.1 10*3/uL (ref 0.7–4.0)
MCH: 30.5 pg (ref 26.0–34.0)
MCHC: 34.6 g/dL (ref 30.0–36.0)
MCV: 88.3 fL (ref 78.0–100.0)
Monocytes Absolute: 0.2 10*3/uL (ref 0.1–1.0)
Monocytes Relative: 3 %
Neutro Abs: 4.7 10*3/uL (ref 1.7–7.7)
Neutrophils Relative %: 66 %
Platelets: 243 10*3/uL (ref 150–400)
RBC: 3.93 MIL/uL (ref 3.87–5.11)
RDW: 13 % (ref 11.5–15.5)
WBC: 7.1 10*3/uL (ref 4.0–10.5)

## 2017-05-26 LAB — URINALYSIS, ROUTINE W REFLEX MICROSCOPIC
Bilirubin Urine: NEGATIVE
Glucose, UA: NEGATIVE mg/dL
Hgb urine dipstick: NEGATIVE
Ketones, ur: NEGATIVE mg/dL
Leukocytes, UA: NEGATIVE
Nitrite: NEGATIVE
Protein, ur: NEGATIVE mg/dL
Specific Gravity, Urine: 1.026 (ref 1.005–1.030)
pH: 5 (ref 5.0–8.0)

## 2017-05-26 LAB — WET PREP, GENITAL
Clue Cells Wet Prep HPF POC: NONE SEEN
Sperm: NONE SEEN
Trich, Wet Prep: NONE SEEN
Yeast Wet Prep HPF POC: NONE SEEN

## 2017-05-26 LAB — HCG, QUANTITATIVE, PREGNANCY: hCG, Beta Chain, Quant, S: 96140 m[IU]/mL — ABNORMAL HIGH (ref <5)

## 2017-05-26 NOTE — [facility] Provider Note (Signed)
[facility] Provider Note   CSN: [id number] Arrival date & time: 05/26/17  1758     History   Chief Complaint No chief complaint on file.   HPI Victoria Wright is a 26 y.o. [id number] @ [redacted]w[redacted]d gestation with nausea and vomiting. Taking phenergan left over from another pregnancy, and meclozine without relief. She reports vomiting every time she eats. She does keep down crackers and coke.   Patient also reports irregular periods. She has an appointment with [facility] 06/07/17 but she is concerned about how far along she is.   HPI  Past Medical History:  Diagnosis Date  . Abnormal Pap smear    follow up was wnl  . Acne   . Hypotension    taken out of work r/t bp drops  . UTI (lower urinary tract infection)     Patient Active Problem List   Diagnosis Date Noted  . Routine postpartum follow-up 02/05/2013    Past Surgical History:  Procedure Laterality Date  . WISDOM TOOTH EXTRACTION      OB History    Gravida Para Term Preterm AB Living   5 2 2   1 2    SAB TAB Ectopic Multiple Live Births   1       2       Home Medications    Prior to Admission medications   Medication Sig Start Date End Date Taking? Authorizing Provider  acetaminophen (TYLENOL) 500 MG tablet Take 1 tablet (500 mg total) by mouth every 6 (six) hours as needed. 05/10/17   Cathie Hoops, Amy V, PA-C  metoCLOPramide (REGLAN) 10 MG tablet Take 1 tablet (10 mg total) by mouth every 6 (six) hours. 09/25/16   Marny Lowenstein, PA-C  neomycin-polymyxin-hydrocortisone (CORTISPORIN) 3.5-10000-1 OTIC suspension Place 4 drops into the right ear 3 (three) times daily. 05/10/17   Yu, Amy V, PA-C  Prenat-FeAsp-Meth-FA-DHA w/o A (PRENATE PIXIE) 10-0.6-0.4-200 MG CAPS Take 1 capsule by mouth at bedtime.    [provider]  promethazine (PHENERGAN) 25 MG tablet Take 1 tablet (25 mg total) by mouth every 6 (six) hours as needed for nausea or vomiting. 09/25/16   Marny Lowenstein, PA-C    Family History Family History    Problem Relation Age of Onset  . Hypertension Mother   . Diabetes Maternal Grandmother   . Cancer Maternal Grandfather   . Anesthesia problems Neg Hx   . Hypotension Neg Hx   . Pseudochol deficiency Neg Hx   . Malignant hyperthermia Neg Hx     Social History Social History  Substance Use Topics  . Smoking status: Current Some Day Smoker    Packs/day: 0.50    Types: Cigarettes  . Smokeless tobacco: Never Used  . Alcohol use No     Allergies   Amoxicillin and Prednisone   Review of Systems Review of Systems  Constitutional: Negative for chills and fever.  HENT: Negative.        Ear infection 2 weeks ago but went to Urgent Care and was treated. No problem now.   Eyes: Negative for pain and visual disturbance.  Respiratory: Negative for cough, shortness of breath and wheezing.   Cardiovascular: Negative for chest pain, palpitations and leg swelling.  Gastrointestinal: Positive for abdominal pain, nausea and vomiting.  Genitourinary: Positive for frequency. Negative for dysuria, vaginal bleeding and vaginal discharge.  Musculoskeletal: Negative for back pain, neck pain and neck stiffness.  Skin: Positive for rash (chest). Negative for wound.  Neurological: Positive  for light-headedness. Negative for syncope and headaches.  Hematological: Negative for adenopathy.  Psychiatric/Behavioral: The patient is not nervous/anxious.      Physical Exam Updated Vital Signs BP 118/73 (BP Location: Right Arm)   Pulse 94   Temp 99.8 F (37.7 C) (Oral)   Resp 18   Ht 5\' 6"  (1.676 m)   Wt 200 lb (90.7 kg)   LMP 03/20/2017 (LMP Unknown)   BMI 32.28 kg/m   Physical Exam  Constitutional: She is oriented to person, place, and time. She appears well-developed and well-nourished. No distress.  HENT:  Head: Normocephalic.  Eyes: EOM are normal.  Neck: Neck supple.  Cardiovascular: Normal rate and regular rhythm.   Pulmonary/Chest: Effort normal and breath sounds normal.   Abdominal: Soft. Bowel sounds are normal. There is no tenderness.  Genitourinary:  Genitourinary Comments: External genitalia without lesions, mucous d/c vaginal vault. Cervix long, closed, no CMT no adnexal tenderness or mass palpated. Uterus approximately 10 week size.   Musculoskeletal: Normal range of motion.  Neurological: She is alert and oriented to person, place, and time. No cranial nerve deficit.  Skin: Skin is warm and dry.  Psychiatric: She has a normal mood and affect.  Nursing note and vitals reviewed.    ED Treatments / Results  Labs (all labs ordered are listed, but only abnormal results are displayed) Labs Reviewed  WET PREP, GENITAL - Abnormal; Notable for the following:       Result Value   WBC, Wet Prep HPF POC FEW (*)    All other components within normal limits  CBC WITH DIFFERENTIAL/PLATELET - Abnormal; Notable for the following:    HCT 34.7 (*)    All other components within normal limits  HCG, QUANTITATIVE, PREGNANCY - Abnormal; Notable for the following:    hCG, Beta Chain, Quant, S 96,140 (*)    All other components within normal limits  URINALYSIS, ROUTINE W REFLEX MICROSCOPIC  RPR  HIV ANTIBODY (ROUTINE TESTING)  GC/CHLAMYDIA PROBE AMP ([facility]) NOT AT [facility]    Radiology US Ob Comp Less 14 Wks  Result Date: 05/26/2017 CLINICAL DATA:  Abdominal pain, nausea and vomiting x2 days EXAM: OBSTETRIC <14 WK ULTRASOUND TECHNIQUE: Transabdominal ultrasound was performed for evaluation of the gestation as well as the maternal uterus and adnexal regions. COMPARISON:  None. FINDINGS: Intrauterine gestational sac: Single Yolk sac:  Visualized Embryo:  Visualized Cardiac Activity: Visualized Heart Rate:  169 bpm CRL:   24.7  mm   9 w 1 d                  [place] EDC: 12/28/2017 Subchorionic hemorrhage: 2.7 x 0.7 x 3.7 cm crescentic focus of hemorrhage posteriorly. Maternal uterus/adnexae: Normal IMPRESSION: 1. Viable 9 week 1 day single live intrauterine gestation with  ultrasound EDC of 12/28/2017. 2. Small posterior subchorionic focus of hemorrhage measuring 2.7 x 0.7 x 3.7 cm. Electronically Signed   By: Tollie Eth M.D.   On: 05/26/2017 20:07    Procedures Procedures (including critical care time)  Medications Ordered in ED Medications - No data to display   Initial Impression / Assessment and Plan / ED Course  I have reviewed the triage vital signs and the nursing notes.  Pertinent labs & imaging results that were available during my care of the patient were reviewed by me and considered in my medical decision making (see chart for details).  Final Clinical Impressions(s) / ED Diagnoses  26 y.o. female with abdominal pain, n/v in  early pregnancy stable for d/c without vaginal bleeding or surgical abdomen. Patient given instructions on nausea in early pregnancy. She has medication for n/v at home. She will keep her appointment with Dr. Verdell CarmineHarper's office to start prenatal care.  Final diagnoses:  Abdominal pain in pregnancy, first trimester  Nausea and vomiting in pregnancy prior to [redacted] weeks gestation    New Prescriptions Current Discharge Medication List

## 2017-05-26 NOTE — Discharge Instructions (Signed)

## 2017-05-27 LAB — HIV ANTIBODY (ROUTINE TESTING W REFLEX): HIV Screen 4th Generation wRfx: NONREACTIVE

## 2017-05-27 LAB — RPR: RPR Ser Ql: NONREACTIVE

## 2017-05-29 LAB — GC/CHLAMYDIA PROBE AMP (~~LOC~~) NOT AT ARMC
Chlamydia: NEGATIVE
Neisseria Gonorrhea: NEGATIVE

## 2017-06-07 ENCOUNTER — Ambulatory Visit (INDEPENDENT_AMBULATORY_CARE_PROVIDER_SITE_OTHER): Payer: Medicaid Other | Admitting: Obstetrics & Gynecology

## 2017-06-07 ENCOUNTER — Encounter: Payer: Self-pay | Admitting: Obstetrics & Gynecology

## 2017-06-07 DIAGNOSIS — Z3481 Encounter for supervision of other normal pregnancy, first trimester: Secondary | ICD-10-CM

## 2017-06-07 DIAGNOSIS — Z3490 Encounter for supervision of normal pregnancy, unspecified, unspecified trimester: Secondary | ICD-10-CM

## 2017-06-07 MED ORDER — DOXYLAMINE-PYRIDOXINE 10-10 MG PO TBEC: 1.0000 | DELAYED_RELEASE_TABLET | Freq: Two times a day (BID) | ORAL | 0 refills | Status: DC | PRN

## 2017-06-07 NOTE — Progress Notes (Signed)
Patient presents for NOB visit.

## 2017-06-07 NOTE — Progress Notes (Signed)
  Subjective:    Victoria Wright is a [id number] 6833w2d being seen today for her first obstetrical visit.  Her obstetrical history is significant for previous macrosomic infant. Patient does not intend to breast feed. Pregnancy history fully reviewed.  Patient reports nausea and vomiting.  Vitals:   06/07/17 1115  BP: 123/72  Pulse: 82  Weight: 210 lb (95.3 kg)    HISTORY: OB History  Gravida Para Term Preterm AB Living  5 2 2   1 2   SAB TAB Ectopic Multiple Live Births  1       2    # Outcome Date GA Lbr Len/2nd Weight Sex Delivery Anes PTL Lv  5 Current           4 Term 12/22/12 2536w2d 09:12 / 00:19 9 lb 7.9 oz (4.305 kg) M Vag-Spont EPI  LIV  3 Term 05/27/07 3480w0d  8 lb 7 oz (3.827 kg) M Vag-Spont EPI  LIV     Complications: Postpartum hemorrhage  2 Gravida           1 SAB              Past Medical History:  Diagnosis Date  . Abnormal Pap smear    follow up was wnl  . Acne   . Hypotension    taken out of work r/t bp drops  . UTI (lower urinary tract infection)    Past Surgical History:  Procedure Laterality Date  . WISDOM TOOTH EXTRACTION     Family History  Problem Relation Age of Onset  . Hypertension Mother   . Diabetes Maternal Grandmother   . Cancer Maternal Grandfather   . Anesthesia problems Neg Hx   . Hypotension Neg Hx   . Pseudochol deficiency Neg Hx   . Malignant hyperthermia Neg Hx      Exam    Uterus:     Pelvic Exam:   Declined pelvic and breast                                 Skin: normal coloration and turgor, no rashes    Neurologic: oriented, normal   Extremities: normal strength, tone, and muscle mass   HEENT sclera clear, anicteric   Mouth/Teeth mucous membranes moist, pharynx normal without lesions and dental hygiene good   Neck supple   Cardiovascular: regular rate and rhythm   Respiratory:  appears well, vitals normal, no respiratory distress, acyanotic, normal RR   Abdomen: soft, non-tender; bowel sounds normal; no  masses,  no organomegaly   Urinary: urethral meatus normal      Assessment:    Pregnancy: [id number] Patient Active Problem List   Diagnosis Date Noted  . Encounter for supervision of normal pregnancy, antepartum 06/07/2017        Plan:     Initial labs drawn. Prenatal vitamins. Problem list reviewed and updated. Genetic Screening discussed declines  Ultrasound discussed; fetal survey: ordered.  Follow up in 4 weeks. 50% of 30 min visit spent on counseling and coordination of care.  Diclegis prn nausea   Victoria Wright 06/07/2017

## 2017-06-07 NOTE — Patient Instructions (Signed)
First Trimester of Pregnancy The first trimester of pregnancy is from week 1 until the end of week 13 (months 1 through 3). A week after a sperm fertilizes an egg, the egg will implant on the wall of the uterus. This embryo will begin to develop into a baby. Genes from you and your partner will form the baby. The female genes will determine whether the baby will be a boy or a girl. At 6-8 weeks, the eyes and face will be formed, and the heartbeat can be seen on ultrasound. At the end of 12 weeks, all the baby's organs will be formed. Now that you are pregnant, you will want to do everything you can to have a healthy baby. Two of the most important things are to get good prenatal care and to follow your health care provider's instructions. Prenatal care is all the medical care you receive before the baby's birth. This care will help prevent, find, and treat any problems during the pregnancy and childbirth. Body changes during your first trimester Your body goes through many changes during pregnancy. The changes vary from woman to woman.  You may gain or lose a couple of pounds at first.  You may feel sick to your stomach (nauseous) and you may throw up (vomit). If the vomiting is uncontrollable, call your health care provider.  You may tire easily.  You may develop headaches that can be relieved by medicines. All medicines should be approved by your health care provider.  You may urinate more often. Painful urination may mean you have a bladder infection.  You may develop heartburn as a result of your pregnancy.  You may develop constipation because certain hormones are causing the muscles that push stool through your intestines to slow down.  You may develop hemorrhoids or swollen veins (varicose veins).  Your breasts may begin to grow larger and become tender. Your nipples may stick out more, and the tissue that surrounds them (areola) may become darker.  Your gums may bleed and may be  sensitive to brushing and flossing.  Dark spots or blotches (chloasma, mask of pregnancy) may develop on your face. This will likely fade after the baby is born.  Your menstrual periods will stop.  You may have a loss of appetite.  You may develop cravings for certain kinds of food.  You may have changes in your emotions from day to day, such as being excited to be pregnant or being concerned that something may go wrong with the pregnancy and baby.  You may have more vivid and strange dreams.  You may have changes in your hair. These can include thickening of your hair, rapid growth, and changes in texture. Some women also have hair loss during or after pregnancy, or hair that feels dry or thin. Your hair will most likely return to normal after your baby is born.  What to expect at prenatal visits During a routine prenatal visit:  You will be weighed to make sure you and the baby are growing normally.  Your blood pressure will be taken.  Your abdomen will be measured to track your baby's growth.  The fetal heartbeat will be listened to between weeks 10 and 14 of your pregnancy.  Test results from any previous visits will be discussed.  Your health care provider may ask you:  How you are feeling.  If you are feeling the baby move.  If you have had any abnormal symptoms, such as leaking fluid, bleeding, severe headaches,   or abdominal cramping.  If you are using any tobacco products, including cigarettes, chewing tobacco, and electronic cigarettes.  If you have any questions.  Other tests that may be performed during your first trimester include:  Blood tests to find your blood type and to check for the presence of any previous infections. The tests will also be used to check for low iron levels (anemia) and protein on red blood cells (Rh antibodies). Depending on your risk factors, or if you previously had diabetes during pregnancy, you may have tests to check for high blood  sugar that affects pregnant women (gestational diabetes).  Urine tests to check for infections, diabetes, or protein in the urine.  An ultrasound to confirm the proper growth and development of the baby.  Fetal screens for spinal cord problems (spina bifida) and Down syndrome.  HIV (human immunodeficiency virus) testing. Routine prenatal testing includes screening for HIV, unless you choose not to have this test.  You may need other tests to make sure you and the baby are doing well.  Follow these instructions at home: Medicines  Follow your health care provider's instructions regarding medicine use. Specific medicines may be either safe or unsafe to take during pregnancy.  Take a prenatal vitamin that contains at least 600 micrograms (mcg) of folic acid.  If you develop constipation, try taking a stool softener if your health care provider approves. Eating and drinking  Eat a balanced diet that includes fresh fruits and vegetables, whole grains, good sources of protein such as meat, eggs, or tofu, and low-fat dairy. Your health care provider will help you determine the amount of weight gain that is right for you.  Avoid raw meat and uncooked cheese. These carry germs that can cause birth defects in the baby.  Eating four or five small meals rather than three large meals a day may help relieve nausea and vomiting. If you start to feel nauseous, eating a few soda crackers can be helpful. Drinking liquids between meals, instead of during meals, also seems to help ease nausea and vomiting.  Limit foods that are high in fat and processed sugars, such as fried and sweet foods.  To prevent constipation: ? Eat foods that are high in fiber, such as fresh fruits and vegetables, whole grains, and beans. ? Drink enough fluid to keep your urine clear or pale yellow. Activity  Exercise only as directed by your health care provider. Most women can continue their usual exercise routine during  pregnancy. Try to exercise for 30 minutes at least 5 days a week. Exercising will help you: ? Control your weight. ? Stay in shape. ? Be prepared for labor and delivery.  Experiencing pain or cramping in the lower abdomen or lower back is a good sign that you should stop exercising. Check with your health care provider before continuing with normal exercises.  Try to avoid standing for long periods of time. Move your legs often if you must stand in one place for a long time.  Avoid heavy lifting.  Wear low-heeled shoes and practice good posture.  You may continue to have sex unless your health care provider tells you not to. Relieving pain and discomfort  Wear a good support bra to relieve breast tenderness.  Take warm sitz baths to soothe any pain or discomfort caused by hemorrhoids. Use hemorrhoid cream if your health care provider approves.  Rest with your legs elevated if you have leg cramps or low back pain.  If you develop   varicose veins in your legs, wear support hose. Elevate your feet for 15 minutes, 3-4 times a day. Limit salt in your diet. Prenatal care  Schedule your prenatal visits by the twelfth week of pregnancy. They are usually scheduled monthly at first, then more often in the last 2 months before delivery.  Write down your questions. Take them to your prenatal visits.  Keep all your prenatal visits as told by your health care provider. This is important. Safety  Wear your seat belt at all times when driving.  Make a list of emergency phone numbers, including numbers for family, friends, the hospital, and police and fire departments. General instructions  Ask your health care provider for a referral to a local prenatal education class. Begin classes no later than the beginning of month 6 of your pregnancy.  Ask for help if you have counseling or nutritional needs during pregnancy. Your health care provider can offer advice or refer you to specialists for help  with various needs.  Do not use hot tubs, steam rooms, or saunas.  Do not douche or use tampons or scented sanitary pads.  Do not cross your legs for long periods of time.  Avoid cat litter boxes and soil used by cats. These carry germs that can cause birth defects in the baby and possibly loss of the fetus by miscarriage or stillbirth.  Avoid all smoking, herbs, alcohol, and medicines not prescribed by your health care provider. Chemicals in these products affect the formation and growth of the baby.  Do not use any products that contain nicotine or tobacco, such as cigarettes and e-cigarettes. If you need help quitting, ask your health care provider. You may receive counseling support and other resources to help you quit.  Schedule a dentist appointment. At home, brush your teeth with a soft toothbrush and be gentle when you floss. Contact a health care provider if:  You have dizziness.  You have mild pelvic cramps, pelvic pressure, or nagging pain in the abdominal area.  You have persistent nausea, vomiting, or diarrhea.  You have a bad smelling vaginal discharge.  You have pain when you urinate.  You notice increased swelling in your face, hands, legs, or ankles.  You are exposed to fifth disease or chickenpox.  You are exposed to German measles (rubella) and have never had it. Get help right away if:  You have a fever.  You are leaking fluid from your vagina.  You have spotting or bleeding from your vagina.  You have severe abdominal cramping or pain.  You have rapid weight gain or loss.  You vomit blood or material that looks like coffee grounds.  You develop a severe headache.  You have shortness of breath.  You have any kind of trauma, such as from a fall or a car accident. Summary  The first trimester of pregnancy is from week 1 until the end of week 13 (months 1 through 3).  Your body goes through many changes during pregnancy. The changes vary from  woman to woman.  You will have routine prenatal visits. During those visits, your health care provider will examine you, discuss any test results you may have, and talk with you about how you are feeling. This information is not intended to replace advice given to you by your health care provider. Make sure you discuss any questions you have with your health care provider. Document Released: 10/18/2001 Document Revised: 10/05/2016 Document Reviewed: 10/05/2016 Elsevier Interactive Patient Education  2017 Elsevier   Inc.  

## 2017-06-09 LAB — HEMOGLOBINOPATHY EVALUATION
HGB C: 0 %
HGB S: 0 %
HGB VARIANT: 0 %
Hemoglobin A2 Quantitation: 2.5 % (ref 1.8–3.2)
Hemoglobin F Quantitation: 0 % (ref 0.0–2.0)
Hgb A: 97.5 % (ref 96.4–98.8)

## 2017-06-09 LAB — PRENATAL PROFILE I(LABCORP)
Antibody Screen: NEGATIVE
Basophils Absolute: 0 10*3/uL (ref 0.0–0.2)
Basos: 0 %
EOS (ABSOLUTE): 0.3 10*3/uL (ref 0.0–0.4)
Eos: 5 %
Hematocrit: 34.9 % (ref 34.0–46.6)
Hemoglobin: 12 g/dL (ref 11.1–15.9)
Hepatitis B Surface Ag: NEGATIVE
Immature Grans (Abs): 0 10*3/uL (ref 0.0–0.1)
Immature Granulocytes: 0 %
Lymphocytes Absolute: 1.7 10*3/uL (ref 0.7–3.1)
Lymphs: 34 %
MCH: 30.3 pg (ref 26.6–33.0)
MCHC: 34.4 g/dL (ref 31.5–35.7)
MCV: 88 fL (ref 79–97)
Monocytes Absolute: 0.4 10*3/uL (ref 0.1–0.9)
Monocytes: 9 %
Neutrophils Absolute: 2.6 10*3/uL (ref 1.4–7.0)
Neutrophils: 52 %
Platelets: 249 10*3/uL (ref 150–379)
RBC: 3.96 x10E6/uL (ref 3.77–5.28)
RDW: 13.2 % (ref 12.3–15.4)
RPR Ser Ql: NONREACTIVE
Rh Factor: POSITIVE
Rubella Antibodies, IGG: 6.04 index (ref >0.99)
WBC: 5 10*3/uL (ref 3.4–10.8)

## 2017-06-09 LAB — HEMOGLOBIN A1C
Est. average glucose Bld gHb Est-mCnc: 108 mg/dL
Hgb A1c MFr Bld: 5.4 % (ref 4.8–5.6)

## 2017-06-09 LAB — VARICELLA ZOSTER ANTIBODY, IGG: Varicella zoster IgG: <135 index — ABNORMAL LOW (ref >165)

## 2017-06-10 LAB — URINE CULTURE, OB REFLEX

## 2017-06-10 LAB — CULTURE, OB URINE

## 2017-06-13 ENCOUNTER — Telehealth: Payer: Self-pay | Admitting: *Deleted

## 2017-06-13 LAB — CYSTIC FIBROSIS MUTATION 97: Interpretation: NOT DETECTED

## 2017-06-13 NOTE — Telephone Encounter (Signed)
PA for Diclegis submitted and approved. Pharmacy made aware. 

## 2017-06-22 ENCOUNTER — Encounter: Payer: Self-pay | Admitting: Family Medicine

## 2017-06-22 DIAGNOSIS — O99330 Smoking (tobacco) complicating pregnancy, unspecified trimester: Secondary | ICD-10-CM | POA: Insufficient documentation

## 2017-06-22 DIAGNOSIS — O09891 Supervision of other high risk pregnancies, first trimester: Secondary | ICD-10-CM | POA: Insufficient documentation

## 2017-06-22 DIAGNOSIS — O09299 Supervision of pregnancy with other poor reproductive or obstetric history, unspecified trimester: Secondary | ICD-10-CM | POA: Insufficient documentation

## 2017-06-22 DIAGNOSIS — Z283 Underimmunization status: Secondary | ICD-10-CM

## 2017-07-05 ENCOUNTER — Ambulatory Visit (INDEPENDENT_AMBULATORY_CARE_PROVIDER_SITE_OTHER): Payer: Medicaid Other | Admitting: Obstetrics & Gynecology

## 2017-07-05 VITALS — BP 139/76 | HR 102 | Wt 210.0 lb

## 2017-07-05 DIAGNOSIS — Z3480 Encounter for supervision of other normal pregnancy, unspecified trimester: Secondary | ICD-10-CM

## 2017-07-05 MED ORDER — PRENATAL VITAMINS 0.8 MG PO TABS: 1.0000 | ORAL_TABLET | Freq: Every day | ORAL | 12 refills | Status: DC

## 2017-07-05 NOTE — Patient Instructions (Signed)
Alpha-Fetoprotein Test Why am I having this test? This test is used to screen for birth defects, such as chromosomal abnormalities, neural tube defects, and body wall defects. It can also be used as a tumor marker for certain cancers. Alpha-Fetoprotein (AFP) is a protein that is made by the fetal liver. Levels can be detected during pregnancy, starting at [redacted] weeks gestation and peaking at 16-[redacted] weeks gestation. Your health care provider may perform this test if you are pregnant or if a tumor is suspected. What kind of sample is taken? A blood sample is required for this test. It is usually collected by inserting a needle into a vein. How do I prepare for this test? There is no preparation or fasting required for this test. What are the reference ranges? References ranges are considered healthy ranges established after testing a large group of healthy people. Reference ranges may vary among different people, labs, and hospitals. It is your responsibility to obtain your test results. Ask the lab or department performing the test when and how you will get your results. Reference ranges for AFP are the following:  Adult: Less than 40 ng/mL or less than 40 mcg/L (SI units).  Child younger than 1 year: Less than 30 ng/mL.  Ranges are stratified by weeks of gestation, and they vary among laboratories. What do the results mean? Values above the reference ranges in pregnant women may indicate:  Neural tube defects.  Abdominal wall defects.  Multiple pregnancy.  Congenital abnormalities.  Fetal distress or fetal death.  Values above the reference ranges in nonpregnant women may indicate:  Reproductive cancers.  Liver cancer.  Liver cell death.  Other types of cancer.  Values below the reference ranges in pregnant women may indicate:  Down syndrome.  Fetal death.  Talk with your health care provider to discuss your results, treatment options, and if necessary, the need for more  tests. Talk with your health care provider if you have any questions about your results. Talk with your health care provider to discuss your results, treatment options, and if necessary, the need for more tests. Talk with your health care provider if you have any questions about your results. This information is not intended to replace advice given to you by your health care provider. Make sure you discuss any questions you have with your health care provider. Document Released: 11/17/2004 Document Revised: 06/27/2016 Document Reviewed: 03/28/2014 Elsevier Interactive Patient Education  2018 [employer]vier Inc.  

## 2017-07-05 NOTE — Progress Notes (Signed)
   PRENATAL VISIT NOTE  Subjective:  Victoria Wright is a 26 y.o. [phone] at [redacted]w[redacted]d being seen today for ongoing prenatal care.  She is currently monitored for the following issues for this low-risk pregnancy and has Encounter for supervision of normal pregnancy, antepartum; History of macrosomia in infant in prior pregnancy, currently pregnant; Susceptible to Varicella (non-immune), currently pregnant in first trimester; and Tobacco use affecting pregnancy, antepartum on her problem list.  Patient reports no complaints.  Contractions: Not present. Vag. Bleeding: None.  Movement: Present. Denies leaking of fluid.   The following portions of the patient's history were reviewed and updated as appropriate: allergies, current medications, past family history, past medical history, past social history, past surgical history and problem list. Problem list updated.  Objective:   Vitals:   07/05/17 0925  BP: 139/76  Pulse: (!) 102  Weight: 95.3 kg (210 lb)    Fetal Status:     Movement: Present     General:  Alert, oriented and cooperative. Patient is in no acute distress.  Skin: Skin is warm and dry. No rash noted.   Cardiovascular: Normal heart rate noted  Respiratory: Normal respiratory effort, no problems with respiration noted  Abdomen: Soft, gravid, appropriate for gestational age.  Pain/Pressure: Absent     Pelvic: Cervical exam deferred        Extremities: Normal range of motion.  Edema: None  Mental Status:  Normal mood and affect. Normal behavior. Normal judgment and thought content.   Assessment and Plan:  Pregnancy: [id number] at [redacted]w[redacted]d  1. Supervision of other normal pregnancy, antepartum Discussed screening and she accepts, questions answered - AFP TETRA - Prenatal Multivit-Min-Fe-FA (PRENATAL VITAMINS) 0.8 MG tablet; Take 1 tablet by mouth daily.  Dispense: 30 tablet; Refill: 12  Preterm labor symptoms and general obstetric precautions including but not limited to vaginal  bleeding, contractions, leaking of fluid and fetal movement were reviewed in detail with the patient. Please refer to After Visit Summary for other counseling recommendations.  4 week f/u Adam Phenix, MD

## 2017-07-05 NOTE — Progress Notes (Signed)
Complains of numbness in left arm and hand at night

## 2017-07-08 LAB — AFP TETRA
DIA Mom Value: 1.21
DIA Value (EIA): 180.07 pg/mL
DSR (By Age)    1 IN: 898
DSR (Second Trimester) 1 IN: 1120
Gestational Age: 15.2 WEEKS
MSAFP Mom: 0.88
MSAFP: 22.6 ng/mL
MSHCG Mom: 1.17
MSHCG: 46172 m[IU]/mL
Maternal Age At EDD: 27.4 yr
Osb Risk: 10000
T18 (By Age): 1:3500 {titer}
Test Results:: NEGATIVE
Weight: 210 [lb_av]
uE3 Mom: 0.68
uE3 Value: 0.37 ng/mL

## 2017-07-26 ENCOUNTER — Ambulatory Visit (HOSPITAL_COMMUNITY)
Admission: RE | Admit: 2017-07-26 | Discharge: 2017-07-26 | Disposition: A | Discharge status: Home / Self Care | Payer: Medicaid Other | Source: Ambulatory Visit | Attending: Obstetrics & Gynecology | Admitting: Obstetrics & Gynecology

## 2017-07-26 ENCOUNTER — Ambulatory Visit (HOSPITAL_COMMUNITY): Payer: Medicaid Other

## 2017-07-26 DIAGNOSIS — Z3689 Encounter for other specified antenatal screening: Secondary | ICD-10-CM | POA: Diagnosis not present

## 2017-07-26 DIAGNOSIS — O09299 Supervision of pregnancy with other poor reproductive or obstetric history, unspecified trimester: Secondary | ICD-10-CM | POA: Insufficient documentation

## 2017-07-26 DIAGNOSIS — Z3A18 18 weeks gestation of pregnancy: Secondary | ICD-10-CM | POA: Diagnosis not present

## 2017-07-26 DIAGNOSIS — Z3490 Encounter for supervision of normal pregnancy, unspecified, unspecified trimester: Secondary | ICD-10-CM

## 2017-07-26 DIAGNOSIS — O99332 Smoking (tobacco) complicating pregnancy, second trimester: Secondary | ICD-10-CM | POA: Insufficient documentation

## 2017-07-27 ENCOUNTER — Telehealth: Payer: Self-pay | Admitting: *Deleted

## 2017-07-27 NOTE — Telephone Encounter (Signed)
Patient called with severe constipation so bad that she is having rectal bleeding. She wants advisement. 9:17 Call to patient- LM on VM- Patient may use Mira lax or MOM until stool is moving. Take Colace or Senakot daily and eat food to promote stool movement prunes, prune juice, vegatable and fruit, and increase fluids. As for the rectal bleeding - if she has fissure- keep clean and can use antibacterial ointment or cream- if hemorrhoid- can use OTC prep H , witch hazel .

## 2017-08-02 ENCOUNTER — Ambulatory Visit (INDEPENDENT_AMBULATORY_CARE_PROVIDER_SITE_OTHER): Payer: Medicaid Other | Admitting: Obstetrics & Gynecology

## 2017-08-02 ENCOUNTER — Encounter: Payer: Medicaid Other | Admitting: Obstetrics & Gynecology

## 2017-08-02 DIAGNOSIS — Z3480 Encounter for supervision of other normal pregnancy, unspecified trimester: Secondary | ICD-10-CM

## 2017-08-02 DIAGNOSIS — Z3482 Encounter for supervision of other normal pregnancy, second trimester: Secondary | ICD-10-CM

## 2017-08-02 DIAGNOSIS — Z23 Encounter for immunization: Secondary | ICD-10-CM

## 2017-08-02 MED ORDER — ALBUTEROL SULFATE HFA 108 (90 BASE) MCG/ACT IN AERS: 2.0000 | INHALATION_SPRAY | Freq: Four times a day (QID) | RESPIRATORY_TRACT | 1 refills | Status: DC | PRN

## 2017-08-02 MED ORDER — FAMOTIDINE 40 MG PO TABS: 40.0000 mg | ORAL_TABLET | Freq: Every evening | ORAL | 1 refills | Status: DC

## 2017-08-02 MED ORDER — DOCUSATE SODIUM 100 MG PO CAPS: 100.0000 mg | ORAL_CAPSULE | Freq: Two times a day (BID) | ORAL | 2 refills | Status: DC | PRN

## 2017-08-02 NOTE — Progress Notes (Signed)
   PRENATAL VISIT NOTE  Subjective:  Victoria Wright is a 26 y.o. [id number] at [redacted]w[redacted]d being seen today for ongoing prenatal care.  She is currently monitored for the following issues for this low-risk pregnancy and has Encounter for supervision of normal pregnancy, antepartum; History of macrosomia in infant in prior pregnancy, currently pregnant; Susceptible to Varicella (non-immune), currently pregnant in first trimester; and Tobacco use affecting pregnancy, antepartum on her problem list.  Patient reports heartburn, nausea and constipation and an episode of rectal bleeding last week.  Contractions: Not present. Vag. Bleeding: None.  Movement: Present. Denies leaking of fluid.   The following portions of the patient's history were reviewed and updated as appropriate: allergies, current medications, past family history, past medical history, past social history, past surgical history and problem list. Problem list updated.  Objective:   Vitals:   08/02/17 1101  BP: 138/76  Pulse: 97  Weight: 220 lb 6.4 oz (100 kg)    Fetal Status:     Movement: Present     General:  Alert, oriented and cooperative. Patient is in no acute distress.  Skin: Skin is warm and dry. No rash noted.   Cardiovascular: Normal heart rate noted  Respiratory: Normal respiratory effort, no problems with respiration noted  Abdomen: Soft, gravid, appropriate for gestational age.  Pain/Pressure: Absent     Pelvic: Cervical exam deferred        Extremities: Normal range of motion.  Edema: Trace  Mental Status:  Normal mood and affect. Normal behavior. Normal judgment and thought content.   Assessment and Plan:  Pregnancy: [id number] at [redacted]w[redacted]d  1. Supervision of other normal pregnancy, antepartum H/O asthma as well asks to refill inhaler - Flu Vaccine QUAD 36+ mos IM (Fluarix, Quad PF) - albuterol (PROVENTIL HFA;VENTOLIN HFA) 108 (90 Base) MCG/ACT inhaler; Inhale 2 puffs into the lungs every 6 (six) hours as needed for  wheezing or shortness of breath.  Dispense: 1 Inhaler; Refill: 1 - docusate sodium (COLACE) 100 MG capsule; Take 1 capsule (100 mg total) by mouth 2 (two) times daily as needed.  Dispense: 30 capsule; Refill: 2 - famotidine (PEPCID) 40 MG tablet; Take 1 tablet (40 mg total) by mouth every evening.  Dispense: 30 tablet; Refill: 1  Preterm labor symptoms and general obstetric precautions including but not limited to vaginal bleeding, contractions, leaking of fluid and fetal movement were reviewed in detail with the patient. Please refer to After Visit Summary for other counseling recommendations.  Return in about 4 weeks (around 08/30/2017).   Scheryl Darter, MD'

## 2017-08-02 NOTE — Patient Instructions (Signed)

## 2017-08-06 ENCOUNTER — Inpatient Hospital Stay (HOSPITAL_COMMUNITY)
Admission: AD | Admit: 2017-08-06 | Discharge: 2017-08-06 | Disposition: A | Discharge status: Home / Self Care | Payer: Medicaid Other | Source: Ambulatory Visit | Attending: Family Medicine | Admitting: Family Medicine

## 2017-08-06 ENCOUNTER — Encounter (HOSPITAL_COMMUNITY): Payer: Self-pay | Admitting: *Deleted

## 2017-08-06 DIAGNOSIS — O09292 Supervision of pregnancy with other poor reproductive or obstetric history, second trimester: Secondary | ICD-10-CM

## 2017-08-06 DIAGNOSIS — Z3A19 19 weeks gestation of pregnancy: Secondary | ICD-10-CM | POA: Diagnosis not present

## 2017-08-06 DIAGNOSIS — O9989 Other specified diseases and conditions complicating pregnancy, childbirth and the puerperium: Secondary | ICD-10-CM

## 2017-08-06 DIAGNOSIS — Z79899 Other long term (current) drug therapy: Secondary | ICD-10-CM | POA: Diagnosis not present

## 2017-08-06 DIAGNOSIS — N898 Other specified noninflammatory disorders of vagina: Secondary | ICD-10-CM | POA: Diagnosis present

## 2017-08-06 DIAGNOSIS — B379 Candidiasis, unspecified: Secondary | ICD-10-CM | POA: Diagnosis not present

## 2017-08-06 DIAGNOSIS — B373 Candidiasis of vulva and vagina: Secondary | ICD-10-CM | POA: Insufficient documentation

## 2017-08-06 DIAGNOSIS — O09299 Supervision of pregnancy with other poor reproductive or obstetric history, unspecified trimester: Secondary | ICD-10-CM

## 2017-08-06 DIAGNOSIS — F17210 Nicotine dependence, cigarettes, uncomplicated: Secondary | ICD-10-CM | POA: Insufficient documentation

## 2017-08-06 DIAGNOSIS — O99332 Smoking (tobacco) complicating pregnancy, second trimester: Secondary | ICD-10-CM | POA: Diagnosis not present

## 2017-08-06 DIAGNOSIS — Z3480 Encounter for supervision of other normal pregnancy, unspecified trimester: Secondary | ICD-10-CM

## 2017-08-06 DIAGNOSIS — O98812 Other maternal infectious and parasitic diseases complicating pregnancy, second trimester: Secondary | ICD-10-CM | POA: Diagnosis not present

## 2017-08-06 DIAGNOSIS — Z880 Allergy status to penicillin: Secondary | ICD-10-CM | POA: Insufficient documentation

## 2017-08-06 DIAGNOSIS — O99330 Smoking (tobacco) complicating pregnancy, unspecified trimester: Secondary | ICD-10-CM

## 2017-08-06 LAB — WET PREP, GENITAL
Clue Cells Wet Prep HPF POC: NONE SEEN
Sperm: NONE SEEN
Trich, Wet Prep: NONE SEEN

## 2017-08-06 LAB — URINALYSIS, ROUTINE W REFLEX MICROSCOPIC
Bilirubin Urine: NEGATIVE
Glucose, UA: NEGATIVE mg/dL
Hgb urine dipstick: NEGATIVE
Ketones, ur: NEGATIVE mg/dL
Leukocytes, UA: NEGATIVE
Nitrite: NEGATIVE
Protein, ur: NEGATIVE mg/dL
Specific Gravity, Urine: 1.021 (ref 1.005–1.030)
pH: 5 (ref 5.0–8.0)

## 2017-08-06 MED ORDER — FLUCONAZOLE 150 MG PO TABS: 150.0000 mg | ORAL_TABLET | Freq: Once | ORAL | 0 refills | Status: DC

## 2017-08-06 MED ORDER — FLUCONAZOLE 150 MG PO TABS: 150.0000 mg | ORAL_TABLET | Freq: Once | ORAL | 0 refills | Status: AC

## 2017-08-06 NOTE — MAU Note (Signed)
Pt presents with c/o vaginal discharge & itching.  Pt reports discharge started yesterday, initially discharge looked like "mucous plug", today discharge has green tint.  Denies odor.

## 2017-08-06 NOTE — Discharge Instructions (Signed)

## 2017-08-06 NOTE — MAU Provider Note (Signed)
History     CSN: [id number]  Arrival date and time: 08/06/17 1426   First Provider Initiated Contact with Patient 08/06/17 1503      Chief Complaint  Patient presents with  . Vaginal Discharge   26 yo G5P2012 at [redacted]w[redacted]d gestation here for vaginal discharge x few days. Patient says discharge is green and initially looked like here "mucous plug." She has associated cramping and nausea.     OB History    Gravida Para Term Preterm AB Living   SAB TAB Ectopic Multiple Live Births   1       2      Past Medical History:  Diagnosis Date  . Abnormal Pap smear    follow up was wnl  . Acne   . Hypotension    taken out of work r/t bp drops  . UTI (lower urinary tract infection)     Past Surgical History:  Procedure Laterality Date  . WISDOM TOOTH EXTRACTION      Family History  Problem Relation Age of Onset  . Hypertension Mother   . Diabetes Maternal Grandmother   . Cancer Maternal Grandfather   . Anesthesia problems Neg Hx   . Hypotension Neg Hx   . Pseudochol deficiency Neg Hx   . Malignant hyperthermia Neg Hx     Social History  Substance Use Topics  . Smoking status: Current Some Day Smoker    Packs/day: 0.50    Types: Cigarettes  . Smokeless tobacco: Never Used  . Alcohol use No    Allergies:  Allergies  Allergen Reactions  . Amoxicillin Rash and Other (See Comments)    Has patient had a PCN reaction causing immediate rash, facial/tongue/throat swelling, SOB or lightheadedness with hypotension: No Has patient had a PCN reaction causing severe rash involving mucus membranes or skin necrosis: No Has patient had a PCN reaction that required hospitalization No Has patient had a PCN reaction occurring within the last 10 years: No If all of the above answers are "NO", then may proceed with Cephalosporin use.  . Prednisone Rash    Prescriptions Prior to Admission  Medication Sig Dispense Refill Last Dose  . acetaminophen (TYLENOL) 500 MG  tablet Take 1 tablet (500 mg total) by mouth every 6 (six) hours as needed. (Patient not taking: Reported on 08/02/2017) 30 tablet 0 Not Taking  . albuterol (PROVENTIL HFA;VENTOLIN HFA) 108 (90 Base) MCG/ACT inhaler Inhale 2 puffs into the lungs every 6 (six) hours as needed for wheezing or shortness of breath. 1 Inhaler 1   . docusate sodium (COLACE) 100 MG capsule Take 1 capsule (100 mg total) by mouth 2 (two) times daily as needed. 30 capsule 2   . Doxylamine-Pyridoxine (DICLEGIS) 10-10 MG TBEC Take 1 tablet by mouth 2 (two) times daily as needed. (Patient not taking: Reported on 07/05/2017) 60 tablet 0 Not Taking  . famotidine (PEPCID) 40 MG tablet Take 1 tablet (40 mg total) by mouth every evening. 30 tablet 1   . metoCLOPramide (REGLAN) 10 MG tablet Take 1 tablet (10 mg total) by mouth every 6 (six) hours. (Patient not taking: Reported on 07/05/2017) 30 tablet 0 Not Taking  . Prenat-FeAsp-Meth-FA-DHA w/o A (PRENATE PIXIE) 10-0.6-0.4-200 MG CAPS Take 1 capsule by mouth at bedtime.   Taking  . Prenatal Multivit-Min-Fe-FA (PRENATAL VITAMINS) 0.8 MG tablet Take 1 tablet by mouth daily. (Patient not taking: Reported on 08/02/2017) 30 tablet 12 Not Taking  .  promethazine (PHENERGAN) 25 MG tablet Take 1 tablet (25 mg total) by mouth every 6 (six) hours as needed for nausea or vomiting. (Patient not taking: Reported on 07/05/2017) 30 tablet 0 Not Taking    Review of Systems  Constitutional: Negative for activity change and fever.  Eyes: Negative for discharge and itching.  Respiratory: Negative for chest tightness and shortness of breath.   Cardiovascular: Negative for chest pain and palpitations.  Gastrointestinal: Positive for abdominal pain and nausea. Negative for diarrhea and vomiting.  Endocrine: Negative for cold intolerance and heat intolerance.  Genitourinary: Positive for vaginal discharge. Negative for pelvic pain.  Musculoskeletal: Negative for arthralgias and back pain.  Neurological:  Negative for dizziness and facial asymmetry.   Physical Exam   Blood pressure 126/70, pulse 93, temperature 98.5 F (36.9 C), temperature source Oral, resp. rate 18, last menstrual period 03/20/2017, SpO2 100 %, unknown if currently breastfeeding.  Physical Exam  Constitutional: She is oriented to person, place, and time. She appears well-developed and well-nourished.  HENT:  Head: Normocephalic and atraumatic.  Eyes: Pupils are equal, round, and reactive to light. Conjunctivae are normal.  Neck: Normal range of motion. Neck supple.  Cardiovascular: Normal rate and intact distal pulses.   Respiratory: Effort normal. No respiratory distress.  GI: Soft. There is no tenderness. There is no rebound.  Musculoskeletal: Normal range of motion. She exhibits no edema.  Neurological: She is alert and oriented to person, place, and time.  Skin: Skin is warm and dry.    MAU Course  Procedures  MDM Most likely vaginal infection- will get gc/chlamydia and wet prep  Assessment and Plan  1. Vaginal yeast infection- treat with diflucan 2. Pregnancy at 19 weeks- routine prenatal care  [facility] 08/06/2017, 3:04 PM

## 2017-08-07 LAB — GC/CHLAMYDIA PROBE AMP (~~LOC~~) NOT AT ARMC
Chlamydia: NEGATIVE
Neisseria Gonorrhea: NEGATIVE

## 2017-08-30 ENCOUNTER — Ambulatory Visit (INDEPENDENT_AMBULATORY_CARE_PROVIDER_SITE_OTHER): Payer: Medicaid Other | Admitting: Obstetrics & Gynecology

## 2017-08-30 VITALS — BP 130/72 | HR 77 | Wt 226.5 lb

## 2017-08-30 DIAGNOSIS — Z3482 Encounter for supervision of other normal pregnancy, second trimester: Secondary | ICD-10-CM

## 2017-08-30 DIAGNOSIS — Z302 Encounter for sterilization: Secondary | ICD-10-CM | POA: Insufficient documentation

## 2017-08-30 DIAGNOSIS — Z3480 Encounter for supervision of other normal pregnancy, unspecified trimester: Secondary | ICD-10-CM

## 2017-08-30 NOTE — Patient Instructions (Signed)
Return to clinic for any scheduled appointments or obstetric concerns, or go to MAU for evaluation  Tdap Vaccine (Tetanus, Diphtheria and Pertussis): What You Need to Know 1. Why get vaccinated? Tetanus, diphtheria and pertussis are very serious diseases. Tdap vaccine can protect us from these diseases. And, Tdap vaccine given to pregnant women can protect newborn babies against pertussis. TETANUS (Lockjaw) is rare in the [place] today. It causes painful muscle tightening and stiffness, usually all over the body.  It can lead to tightening of muscles in the head and neck so you can't open your mouth, swallow, or sometimes even breathe. Tetanus kills about 1 out of 10 people who are infected even after receiving the best medical care. DIPHTHERIA is also rare in the [place] today. It can cause a thick coating to form in the back of the throat.  It can lead to breathing problems, heart failure, paralysis, and death. PERTUSSIS (Whooping Cough) causes severe coughing spells, which can cause difficulty breathing, vomiting and disturbed sleep.  It can also lead to weight loss, incontinence, and rib fractures. Up to 2 in 100 adolescents and 5 in 100 adults with pertussis are hospitalized or have complications, which could include pneumonia or death. These diseases are caused by bacteria. Diphtheria and pertussis are spread from person to person through secretions from coughing or sneezing. Tetanus enters the body through cuts, scratches, or wounds. Before vaccines, as many as 200,000 cases of diphtheria, 200,000 cases of pertussis, and hundreds of cases of tetanus, were reported in the [place] each year. Since vaccination began, reports of cases for tetanus and diphtheria have dropped by about 99% and for pertussis by about 80%. 2. Tdap vaccine Tdap vaccine can protect adolescents and adults from tetanus, diphtheria, and pertussis. One dose of Tdap is routinely given at age 11 or 12.  People who did not get Tdap at that age should get it as soon as possible. Tdap is especially important for healthcare professionals and anyone having close contact with a baby younger than 12 months. Pregnant women should get a dose of Tdap during every pregnancy, to protect the newborn from pertussis. Infants are most at risk for severe, life-threatening complications from pertussis. Another vaccine, called Td, protects against tetanus and diphtheria, but not pertussis. A Td booster should be given every 10 years. Tdap may be given as one of these boosters if you have never gotten Tdap before. Tdap may also be given after a severe cut or burn to prevent tetanus infection. Your doctor or the person giving you the vaccine can give you more information. Tdap may safely be given at the same time as other vaccines. 3. Some people should not get this vaccine  A person who has ever had a life-threatening allergic reaction after a previous dose of any diphtheria, tetanus or pertussis containing vaccine, OR has a severe allergy to any part of this vaccine, should not get Tdap vaccine. Tell the person giving the vaccine about any severe allergies.  Anyone who had coma or long repeated seizures within 7 days after a childhood dose of DTP or DTaP, or a previous dose of Tdap, should not get Tdap, unless a cause other than the vaccine was found. They can still get Td.  Talk to your doctor if you:  have seizures or another nervous system problem,  had severe pain or swelling after any vaccine containing diphtheria, tetanus or pertussis,  ever had a condition called Guillain-Barr Syndrome (GBS),  aren't feeling   well on the day the shot is scheduled. 4. Risks With any medicine, including vaccines, there is a chance of side effects. These are usually mild and go away on their own. Serious reactions are also possible but are rare. Most people who get Tdap vaccine do not have any problems with it. Mild  problems following Tdap:  (Did not interfere with activities)  Pain where the shot was given (about 3 in 4 adolescents or 2 in 3 adults)  Redness or swelling where the shot was given (about 1 person in 5)  Mild fever of at least 100.4F (up to about 1 in 25 adolescents or 1 in 100 adults)  Headache (about 3 or 4 people in 10)  Tiredness (about 1 person in 3 or 4)  Nausea, vomiting, diarrhea, stomach ache (up to 1 in 4 adolescents or 1 in 10 adults)  Chills, sore joints (about 1 person in 10)  Body aches (about 1 person in 3 or 4)  Rash, swollen glands (uncommon) Moderate problems following Tdap:  (Interfered with activities, but did not require medical attention)  Pain where the shot was given (up to 1 in 5 or 6)  Redness or swelling where the shot was given (up to about 1 in 16 adolescents or 1 in 12 adults)  Fever over 102F (about 1 in 100 adolescents or 1 in 250 adults)  Headache (about 1 in 7 adolescents or 1 in 10 adults)  Nausea, vomiting, diarrhea, stomach ache (up to 1 or 3 people in 100)  Swelling of the entire arm where the shot was given (up to about 1 in 500). Severe problems following Tdap:  (Unable to perform usual activities; required medical attention)  Swelling, severe pain, bleeding and redness in the arm where the shot was given (rare). Problems that could happen after any vaccine:   People sometimes faint after a medical procedure, including vaccination. Sitting or lying down for about 15 minutes can help prevent fainting, and injuries caused by a fall. Tell your doctor if you feel dizzy, or have vision changes or ringing in the ears.  Some people get severe pain in the shoulder and have difficulty moving the arm where a shot was given. This happens very rarely.  Any medication can cause a severe allergic reaction. Such reactions from a vaccine are very rare, estimated at fewer than 1 in a million doses, and would happen within a few minutes to a few  hours after the vaccination. As with any medicine, there is a very remote chance of a vaccine causing a serious injury or death. The safety of vaccines is always being monitored. For more information, visit: www.cdc.gov/vaccinesafety/ 5. What if there is a serious problem? What should I look for?  Look for anything that concerns you, such as signs of a severe allergic reaction, very high fever, or unusual behavior. Signs of a severe allergic reaction can include hives, swelling of the face and throat, difficulty breathing, a fast heartbeat, dizziness, and weakness. These would usually start a few minutes to a few hours after the vaccination. What should I do?   If you think it is a severe allergic reaction or other emergency that can't wait, call 9-1-1 or get the person to the nearest hospital. Otherwise, call your doctor.  Afterward, the reaction should be reported to the Vaccine Adverse Event Reporting System (VAERS). Your doctor might file this report, or you can do it yourself through the VAERS web site at www.vaers.hhs.gov, or by calling   1-800-822-7967.  VAERS does not give medical advice. 6. The National Vaccine Injury Compensation Program The National Vaccine Injury Compensation Program (VICP) is a federal program that was created to compensate people who may have been injured by certain vaccines. Persons who believe they may have been injured by a vaccine can learn about the program and about filing a claim by calling 1-800-338-2382 or visiting the VICP website at www.hrsa.gov/vaccinecompensation. There is a time limit to file a claim for compensation. 7. How can I learn more?  Ask your doctor. He or she can give you the vaccine package insert or suggest other sources of information.  Call your local or state health department.  Contact the Centers for Disease Control and Prevention (CDC):  Call 1-800-232-4636 (1-800-CDC-INFO) or  Visit CDC's website at www.cdc.gov/vaccines CDC  Tdap Vaccine VIS (12/31/13) This information is not intended to replace advice given to you by your health care provider. Make sure you discuss any questions you have with your health care provider. Document Released: 04/24/2012 Document Revised: 07/14/2016 Document Reviewed: 07/14/2016 Elsevier Interactive Patient Education  2017 Elsevier Inc.  

## 2017-08-30 NOTE — Progress Notes (Signed)
   PRENATAL VISIT NOTE  Subjective:  Victoria Wright is a 26 y.o. [phone] at 7082w2d being seen today for ongoing prenatal care.  She is currently monitored for the following issues for this low-risk pregnancy and has Encounter for supervision of normal pregnancy, antepartum; History of macrosomia in infant in prior pregnancy, currently pregnant; Susceptible to Varicella (non-immune), currently pregnant in first trimester; Tobacco use affecting pregnancy, antepartum; and Request for sterilization on her problem list.  Patient reports no complaints.  Contractions: Irritability. Vag. Bleeding: None.  Movement: Present. Denies leaking of fluid.   The following portions of the patient's history were reviewed and updated as appropriate: allergies, current medications, past family history, past medical history, past social history, past surgical history and problem list. Problem list updated.  Objective:   Vitals:   08/30/17 0859  BP: 130/72  Pulse: 77  Weight: 226 lb 8 oz (102.7 kg)    Fetal Status: Fetal Heart Rate (bpm): 135 Fundal Height: 23 cm Movement: Present     General:  Alert, oriented and cooperative. Patient is in no acute distress.  Skin: Skin is warm and dry. No rash noted.   Cardiovascular: Normal heart rate noted  Respiratory: Normal respiratory effort, no problems with respiration noted  Abdomen: Soft, gravid, appropriate for gestational age.  Pain/Pressure: Absent     Pelvic: Cervical exam deferred        Extremities: Normal range of motion.  Edema: Trace  Mental Status:  Normal mood and affect. Normal behavior. Normal judgment and thought content.   Assessment and Plan:  Pregnancy: G5P2012 at 1682w2d  1. Supervision of other normal pregnancy, antepartum Will sign BTS papers next visit. Preterm labor symptoms and general obstetric precautions including but not limited to vaginal bleeding, contractions, leaking of fluid and fetal movement were reviewed in detail with the  patient. Please refer to After Visit Summary for other counseling recommendations.  Return in about 4 weeks (around 09/27/2017) for 2 hr GTT, 3rd trimester labs, TDap, OB Visit.   Jaynie CollinsUgonna Thi Sisemore, MD

## 2017-09-27 ENCOUNTER — Other Ambulatory Visit: Payer: Medicaid Other

## 2017-09-27 ENCOUNTER — Ambulatory Visit (INDEPENDENT_AMBULATORY_CARE_PROVIDER_SITE_OTHER): Payer: Medicaid Other | Admitting: Obstetrics & Gynecology

## 2017-09-27 VITALS — BP 107/74 | HR 96 | Wt 230.0 lb

## 2017-09-27 DIAGNOSIS — Z23 Encounter for immunization: Secondary | ICD-10-CM

## 2017-09-27 DIAGNOSIS — Z3480 Encounter for supervision of other normal pregnancy, unspecified trimester: Secondary | ICD-10-CM

## 2017-09-27 DIAGNOSIS — Z3482 Encounter for supervision of other normal pregnancy, second trimester: Secondary | ICD-10-CM

## 2017-09-27 NOTE — Progress Notes (Signed)
   PRENATAL VISIT NOTE  Subjective:  Victoria Wright is a 26 y.o. [phone] at 10936w2d being seen today for ongoing prenatal care.  She is currently monitored for the following issues for this low-risk pregnancy and has Encounter for supervision of normal pregnancy, antepartum; History of macrosomia in infant in prior pregnancy, currently pregnant; Susceptible to Varicella (non-immune), currently pregnant in first trimester; Tobacco use affecting pregnancy, antepartum; and Request for sterilization on their problem list.  Patient reports pain moving from lying to standing.  Contractions: Irregular. Vag. Bleeding: None.  Movement: Present. Denies leaking of fluid.   The following portions of the patient's history were reviewed and updated as appropriate: allergies, current medications, past family history, past medical history, past social history, past surgical history and problem list. Problem list updated.  Objective:   Vitals:   09/27/17 0925  BP: 107/74  Pulse: 96  Weight: 230 lb (104.3 kg)    Fetal Status: Fetal Heart Rate (bpm): 142   Movement: Present     General:  Alert, oriented and cooperative. Patient is in no acute distress.  Skin: Skin is warm and dry. No rash noted.   Cardiovascular: Normal heart rate noted  Respiratory: Normal respiratory effort, no problems with respiration noted  Abdomen: Soft, gravid, appropriate for gestational age.  Pain/Pressure: Absent     Pelvic: Cervical exam deferred        Extremities: Normal range of motion.  Edema: None  Mental Status:  Normal mood and affect. Normal behavior. Normal judgment and thought content.   Assessment and Plan:  Pregnancy: G5P2012 at 936w2d  1. Supervision of other normal pregnancy, antepartum MS pain during pregnancy - Glucose Tolerance, 2 Hours w/1 Hour - CBC - HIV antibody (with reflex) - RPR - Tdap vaccine greater than or equal to 7yo IM  2. Encounter for immunization  - Tdap vaccine greater than or  equal to 7yo IM  Preterm labor symptoms and general obstetric precautions including but not limited to vaginal bleeding, contractions, leaking of fluid and fetal movement were reviewed in detail with the patient. Please refer to After Visit Summary for other counseling recommendations.  Return in about 2 weeks (around 10/11/2017) for needs to sign BTL papers.   Scheryl DarterJames Verne Lanuza, MD

## 2017-09-27 NOTE — Patient Instructions (Signed)

## 2017-09-28 LAB — CBC
Hematocrit: 33.9 % — ABNORMAL LOW (ref 34.0–46.6)
Hemoglobin: 12 g/dL (ref 11.1–15.9)
MCH: 31.6 pg (ref 26.6–33.0)
MCHC: 35.4 g/dL (ref 31.5–35.7)
MCV: 89 fL (ref 79–97)
Platelets: 201 10*3/uL (ref 150–379)
RBC: 3.8 x10E6/uL (ref 3.77–5.28)
RDW: 14.2 % (ref 12.3–15.4)
WBC: 7.6 10*3/uL (ref 3.4–10.8)

## 2017-09-28 LAB — GLUCOSE TOLERANCE, 2 HOURS W/ 1HR
Glucose, 1 hour: 139 mg/dL (ref 65–179)
Glucose, 2 hour: 115 mg/dL (ref 65–152)
Glucose, Fasting: 77 mg/dL (ref 65–91)

## 2017-09-28 LAB — HIV ANTIBODY (ROUTINE TESTING W REFLEX): HIV Screen 4th Generation wRfx: NONREACTIVE

## 2017-09-28 LAB — RPR: RPR Ser Ql: NONREACTIVE

## 2017-10-11 ENCOUNTER — Encounter: Payer: Self-pay | Admitting: Obstetrics and Gynecology

## 2017-10-11 ENCOUNTER — Ambulatory Visit (INDEPENDENT_AMBULATORY_CARE_PROVIDER_SITE_OTHER): Payer: Medicaid Other | Admitting: Obstetrics and Gynecology

## 2017-10-11 ENCOUNTER — Other Ambulatory Visit (HOSPITAL_COMMUNITY)
Admission: RE | Admit: 2017-10-11 | Discharge: 2017-10-11 | Disposition: A | Discharge status: Home / Self Care | Payer: Medicaid Other | Source: Ambulatory Visit | Attending: Obstetrics and Gynecology | Admitting: Obstetrics and Gynecology

## 2017-10-11 DIAGNOSIS — Z3009 Encounter for other general counseling and advice on contraception: Secondary | ICD-10-CM | POA: Insufficient documentation

## 2017-10-11 DIAGNOSIS — O26899 Other specified pregnancy related conditions, unspecified trimester: Secondary | ICD-10-CM

## 2017-10-11 DIAGNOSIS — N898 Other specified noninflammatory disorders of vagina: Secondary | ICD-10-CM | POA: Insufficient documentation

## 2017-10-11 DIAGNOSIS — O26893 Other specified pregnancy related conditions, third trimester: Secondary | ICD-10-CM | POA: Diagnosis not present

## 2017-10-11 DIAGNOSIS — Z3480 Encounter for supervision of other normal pregnancy, unspecified trimester: Secondary | ICD-10-CM

## 2017-10-11 DIAGNOSIS — Z3A29 29 weeks gestation of pregnancy: Secondary | ICD-10-CM | POA: Diagnosis not present

## 2017-10-11 NOTE — Progress Notes (Signed)
Subjective:  Victoria Wright is a 26 y.o. [id number] at 3026w2d being seen today for ongoing prenatal care.  She is currently monitored for the following issues for this low-risk pregnancy and has Encounter for supervision of normal pregnancy, antepartum; History of macrosomia in infant in prior pregnancy, currently pregnant; Susceptible to Varicella (non-immune), currently pregnant in first trimester; Tobacco use affecting pregnancy, antepartum; Request for sterilization; and Vaginal discharge during pregnancy on their problem list.  Patient reports vaginal discharge.  Contractions: Irregular. Vag. Bleeding: None.  Movement: Present. Denies leaking of fluid.   The following portions of the patient's history were reviewed and updated as appropriate: allergies, current medications, past family history, past medical history, past social history, past surgical history and problem list. Problem list updated.  Objective:   Vitals:   10/11/17 0841  BP: 130/77  Pulse: 98  Weight: 235 lb (106.6 kg)    Fetal Status:     Movement: Present     General:  Alert, oriented and cooperative. Patient is in no acute distress.  Skin: Skin is warm and dry. No rash noted.   Cardiovascular: Normal heart rate noted  Respiratory: Normal respiratory effort, no problems with respiration noted  Abdomen: Soft, gravid, appropriate for gestational age. Pain/Pressure: Absent     Pelvic:  Cervical exam deferred        Extremities: Normal range of motion.  Edema: None  Mental Status: Normal mood and affect. Normal behavior. Normal judgment and thought content.   Urinalysis:      Assessment and Plan:  Pregnancy: [id number] at 7126w2d  1. Supervision of other normal pregnancy, antepartum Stable  2. Vaginal discharge during pregnancy in third trimester Await vaginal culture results - Cervicovaginal ancillary only  3. Unwanted fertility BTL papers signed today  Preterm labor symptoms and general obstetric precautions  including but not limited to vaginal bleeding, contractions, leaking of fluid and fetal movement were reviewed in detail with the patient. Please refer to After Visit Summary for other counseling recommendations.  Return in about 2 weeks (around 10/25/2017) for OB visit.   Hermina StaggersErvin, Brailynn Breth L, MD

## 2017-10-11 NOTE — Patient Instructions (Signed)
Third Trimester of Pregnancy The third trimester is from week 28 through week 40 (months 7 through 9). The third trimester is a time when the unborn baby (fetus) is growing rapidly. At the end of the ninth month, the fetus is about 20 inches in length and weighs 6-10 pounds. Body changes during your third trimester Your body will continue to go through many changes during pregnancy. The changes vary from woman to woman. During the third trimester:  Your weight will continue to increase. You can expect to gain 25-35 pounds (11-16 kg) by the end of the pregnancy.  You may begin to get stretch marks on your hips, abdomen, and breasts.  You may urinate more often because the fetus is moving lower into your pelvis and pressing on your bladder.  You may develop or continue to have heartburn. This is caused by increased hormones that slow down muscles in the digestive tract.  You may develop or continue to have constipation because increased hormones slow digestion and cause the muscles that push waste through your intestines to relax.  You may develop hemorrhoids. These are swollen veins (varicose veins) in the rectum that can itch or be painful.  You may develop swollen, bulging veins (varicose veins) in your legs.  You may have increased body aches in the pelvis, back, or thighs. This is due to weight gain and increased hormones that are relaxing your joints.  You may have changes in your hair. These can include thickening of your hair, rapid growth, and changes in texture. Some women also have hair loss during or after pregnancy, or hair that feels dry or thin. Your hair will most likely return to normal after your baby is born.  Your breasts will continue to grow and they will continue to become tender. A yellow fluid (colostrum) may leak from your breasts. This is the first milk you are producing for your baby.  Your belly button may stick out.  You may notice more swelling in your hands,  face, or ankles.  You may have increased tingling or numbness in your hands, arms, and legs. The skin on your belly may also feel numb.  You may feel short of breath because of your expanding uterus.  You may have more problems sleeping. This can be caused by the size of your belly, increased need to urinate, and an increase in your body's metabolism.  You may notice the fetus "dropping," or moving lower in your abdomen (lightening).  You may have increased vaginal discharge.  You may notice your joints feel loose and you may have pain around your pelvic bone.  What to expect at prenatal visits You will have prenatal exams every 2 weeks until week 36. Then you will have weekly prenatal exams. During a routine prenatal visit:  You will be weighed to make sure you and the baby are growing normally.  Your blood pressure will be taken.  Your abdomen will be measured to track your baby's growth.  The fetal heartbeat will be listened to.  Any test results from the previous visit will be discussed.  You may have a cervical check near your due date to see if your cervix has softened or thinned (effaced).  You will be tested for Group B streptococcus. This happens between 35 and 37 weeks.  Your health care provider may ask you:  What your birth plan is.  How you are feeling.  If you are feeling the baby move.  If you have had   any abnormal symptoms, such as leaking fluid, bleeding, severe headaches, or abdominal cramping.  If you are using any tobacco products, including cigarettes, chewing tobacco, and electronic cigarettes.  If you have any questions.  Other tests or screenings that may be performed during your third trimester include:  Blood tests that check for low iron levels (anemia).  Fetal testing to check the health, activity level, and growth of the fetus. Testing is done if you have certain medical conditions or if there are problems during the  pregnancy.  Nonstress test (NST). This test checks the health of your baby to make sure there are no signs of problems, such as the baby not getting enough oxygen. During this test, a belt is placed around your belly. The baby is made to move, and its heart rate is monitored during movement.  What is false labor? False labor is a condition in which you feel small, irregular tightenings of the muscles in the womb (contractions) that usually go away with rest, changing position, or drinking water. These are called Braxton Hicks contractions. Contractions may last for hours, days, or even weeks before true labor sets in. If contractions come at regular intervals, become more frequent, increase in intensity, or become painful, you should see your health care provider. What are the signs of labor?  Abdominal cramps.  Regular contractions that start at 10 minutes apart and become stronger and more frequent with time.  Contractions that start on the top of the uterus and spread down to the lower abdomen and back.  Increased pelvic pressure and dull back pain.  A watery or bloody mucus discharge that comes from the vagina.  Leaking of amniotic fluid. This is also known as your "water breaking." It could be a slow trickle or a gush. Let your health care provider know if it has a color or strange odor. If you have any of these signs, call your health care provider right away, even if it is before your due date. Follow these instructions at home: Medicines  Follow your health care provider's instructions regarding medicine use. Specific medicines may be either safe or unsafe to take during pregnancy.  Take a prenatal vitamin that contains at least 600 micrograms (mcg) of folic acid.  If you develop constipation, try taking a stool softener if your health care provider approves. Eating and drinking  Eat a balanced diet that includes fresh fruits and vegetables, whole grains, good sources of protein  such as meat, eggs, or tofu, and low-fat dairy. Your health care provider will help you determine the amount of weight gain that is right for you.  Avoid raw meat and uncooked cheese. These carry germs that can cause birth defects in the baby.  If you have low calcium intake from food, talk to your health care provider about whether you should take a daily calcium supplement.  Eat four or five small meals rather than three large meals a day.  Limit foods that are high in fat and processed sugars, such as fried and sweet foods.  To prevent constipation: ? Drink enough fluid to keep your urine clear or pale yellow. ? Eat foods that are high in fiber, such as fresh fruits and vegetables, whole grains, and beans. Activity  Exercise only as directed by your health care provider. Most women can continue their usual exercise routine during pregnancy. Try to exercise for 30 minutes at least 5 days a week. Stop exercising if you experience uterine contractions.  Avoid heavy   lifting.  Do not exercise in extreme heat or humidity, or at high altitudes.  Wear low-heel, comfortable shoes.  Practice good posture.  You may continue to have sex unless your health care provider tells you otherwise. Relieving pain and discomfort  Take frequent breaks and rest with your legs elevated if you have leg cramps or low back pain.  Take warm sitz baths to soothe any pain or discomfort caused by hemorrhoids. Use hemorrhoid cream if your health care provider approves.  Wear a good support bra to prevent discomfort from breast tenderness.  If you develop varicose veins: ? Wear support pantyhose or compression stockings as told by your healthcare provider. ? Elevate your feet for 15 minutes, 3-4 times a day. Prenatal care  Write down your questions. Take them to your prenatal visits.  Keep all your prenatal visits as told by your health care provider. This is important. Safety  Wear your seat belt at  all times when driving.  Make a list of emergency phone numbers, including numbers for family, friends, the hospital, and police and fire departments. General instructions  Avoid cat litter boxes and soil used by cats. These carry germs that can cause birth defects in the baby. If you have a cat, ask someone to clean the litter box for you.  Do not travel far distances unless it is absolutely necessary and only with the approval of your health care provider.  Do not use hot tubs, steam rooms, or saunas.  Do not drink alcohol.  Do not use any products that contain nicotine or tobacco, such as cigarettes and e-cigarettes. If you need help quitting, ask your health care provider.  Do not use any medicinal herbs or unprescribed drugs. These chemicals affect the formation and growth of the baby.  Do not douche or use tampons or scented sanitary pads.  Do not cross your legs for long periods of time.  To prepare for the arrival of your baby: ? Take prenatal classes to understand, practice, and ask questions about labor and delivery. ? Make a trial run to the hospital. ? Visit the hospital and tour the maternity area. ? Arrange for maternity or paternity leave through employers. ? Arrange for family and friends to take care of pets while you are in the hospital. ? Purchase a rear-facing car seat and make sure you know how to install it in your car. ? Pack your hospital bag. ? Prepare the baby's nursery. Make sure to remove all pillows and stuffed animals from the baby's crib to prevent suffocation.  Visit your dentist if you have not gone during your pregnancy. Use a soft toothbrush to brush your teeth and be gentle when you floss. Contact a health care provider if:  You are unsure if you are in labor or if your water has broken.  You become dizzy.  You have mild pelvic cramps, pelvic pressure, or nagging pain in your abdominal area.  You have lower back pain.  You have persistent  nausea, vomiting, or diarrhea.  You have an unusual or bad smelling vaginal discharge.  You have pain when you urinate. Get help right away if:  Your water breaks before 37 weeks.  You have regular contractions less than 5 minutes apart before 37 weeks.  You have a fever.  You are leaking fluid from your vagina.  You have spotting or bleeding from your vagina.  You have severe abdominal pain or cramping.  You have rapid weight loss or weight gain.    You have shortness of breath with chest pain.  You notice sudden or extreme swelling of your face, hands, ankles, feet, or legs.  Your baby makes fewer than 10 movements in 2 hours.  You have severe headaches that do not go away when you take medicine.  You have vision changes. Summary  The third trimester is from week 28 through week 40, months 7 through 9. The third trimester is a time when the unborn baby (fetus) is growing rapidly.  During the third trimester, your discomfort may increase as you and your baby continue to gain weight. You may have abdominal, leg, and back pain, sleeping problems, and an increased need to urinate.  During the third trimester your breasts will keep growing and they will continue to become tender. A yellow fluid (colostrum) may leak from your breasts. This is the first milk you are producing for your baby.  False labor is a condition in which you feel small, irregular tightenings of the muscles in the womb (contractions) that eventually go away. These are called Braxton Hicks contractions. Contractions may last for hours, days, or even weeks before true labor sets in.  Signs of labor can include: abdominal cramps; regular contractions that start at 10 minutes apart and become stronger and more frequent with time; watery or bloody mucus discharge that comes from the vagina; increased pelvic pressure and dull back pain; and leaking of amniotic fluid. This information is not intended to replace advice  given to you by your health care provider. Make sure you discuss any questions you have with your health care provider. Document Released: 10/18/2001 Document Revised: 03/31/2016 Document Reviewed: 12/25/2012 Elsevier Interactive Patient Education  2017 [employer]  

## 2017-10-11 NOTE — Progress Notes (Signed)
Pt c/o vaginal irritation possible BV infection per pt.

## 2017-10-12 LAB — CERVICOVAGINAL ANCILLARY ONLY
Bacterial vaginitis: NEGATIVE
Candida vaginitis: POSITIVE — AB
Chlamydia: NEGATIVE
Neisseria Gonorrhea: NEGATIVE
Trichomonas: NEGATIVE

## 2017-10-17 ENCOUNTER — Other Ambulatory Visit: Payer: Self-pay | Admitting: *Deleted

## 2017-10-17 DIAGNOSIS — B3731 Acute candidiasis of vulva and vagina: Secondary | ICD-10-CM

## 2017-10-17 DIAGNOSIS — B373 Candidiasis of vulva and vagina: Secondary | ICD-10-CM

## 2017-10-17 MED ORDER — TERCONAZOLE 0.4 % VA CREA: 1.0000 | TOPICAL_CREAM | Freq: Every day | VAGINAL | 0 refills | Status: DC

## 2017-10-17 NOTE — Progress Notes (Signed)
See lab note.  

## 2017-10-25 ENCOUNTER — Encounter: Payer: Self-pay | Admitting: Obstetrics and Gynecology

## 2017-10-25 ENCOUNTER — Ambulatory Visit (INDEPENDENT_AMBULATORY_CARE_PROVIDER_SITE_OTHER): Payer: Medicaid Other | Admitting: Obstetrics and Gynecology

## 2017-10-25 VITALS — BP 123/70 | HR 106 | Wt 235.0 lb

## 2017-10-25 DIAGNOSIS — Z3480 Encounter for supervision of other normal pregnancy, unspecified trimester: Secondary | ICD-10-CM

## 2017-10-25 DIAGNOSIS — Z3483 Encounter for supervision of other normal pregnancy, third trimester: Secondary | ICD-10-CM

## 2017-10-25 DIAGNOSIS — Z3009 Encounter for other general counseling and advice on contraception: Secondary | ICD-10-CM

## 2017-10-25 MED ORDER — COMFORT FIT MATERNITY SUPP MED MISC: 0 refills | Status: DC

## 2017-10-25 NOTE — Progress Notes (Signed)
   PRENATAL VISIT NOTE  Subjective:  Victoria Wright is a 26 y.o. [id number] at 4325w2d being seen today for ongoing prenatal care.  She is currently monitored for the following issues for this low-risk pregnancy and has Encounter for supervision of normal pregnancy, antepartum; History of macrosomia in infant in prior pregnancy, currently pregnant; Susceptible to Varicella (non-immune), currently pregnant in first trimester; Tobacco use affecting pregnancy, antepartum; Request for sterilization; Vaginal discharge during pregnancy; and Unwanted fertility on their problem list.  Patient reports backache.  Contractions: Irregular. Vag. Bleeding: None.  Movement: Present. Denies leaking of fluid.   The following portions of the patient's history were reviewed and updated as appropriate: allergies, current medications, past family history, past medical history, past social history, past surgical history and problem list. Problem list updated.  Objective:   Vitals:   10/25/17 1050  BP: 123/70  Pulse: (!) 106  Weight: 235 lb (106.6 kg)    Fetal Status: Fetal Heart Rate (bpm): 141(Simultaneous filing. User may not have seen previous data.) Fundal Height: 31 cm Movement: Present     General:  Alert, oriented and cooperative. Patient is in no acute distress.  Skin: Skin is warm and dry. No rash noted.   Cardiovascular: Normal heart rate noted  Respiratory: Normal respiratory effort, no problems with respiration noted  Abdomen: Soft, gravid, appropriate for gestational age.  Pain/Pressure: Absent     Pelvic: Cervical exam deferred        Extremities: Normal range of motion.  Edema: Trace  Mental Status:  Normal mood and affect. Normal behavior. Normal judgment and thought content.   Assessment and Plan:  Pregnancy: [id number] at 3625w2d  1. Supervision of other normal pregnancy, antepartum Patient is doing well Rx maternity belt provided to help with back ache and pelvic pressure. Patient was also  encouraged to stretch  2. Unwanted fertility BTL consent signed 12/5  Preterm labor symptoms and general obstetric precautions including but not limited to vaginal bleeding, contractions, leaking of fluid and fetal movement were reviewed in detail with the patient. Please refer to After Visit Summary for other counseling recommendations.  Return in about 2 weeks (around 11/08/2017) for ROB.   Catalina AntiguaPeggy Burk Hoctor, MD

## 2017-11-02 ENCOUNTER — Inpatient Hospital Stay (HOSPITAL_COMMUNITY)
Admission: AD | Admit: 2017-11-02 | Discharge: 2017-11-02 | Disposition: A | Discharge status: Home / Self Care | Payer: Medicaid Other | Source: Ambulatory Visit | Attending: Obstetrics & Gynecology | Admitting: Obstetrics & Gynecology

## 2017-11-02 ENCOUNTER — Encounter (HOSPITAL_COMMUNITY): Payer: Self-pay | Admitting: *Deleted

## 2017-11-02 DIAGNOSIS — O99333 Smoking (tobacco) complicating pregnancy, third trimester: Secondary | ICD-10-CM | POA: Insufficient documentation

## 2017-11-02 DIAGNOSIS — F17210 Nicotine dependence, cigarettes, uncomplicated: Secondary | ICD-10-CM | POA: Insufficient documentation

## 2017-11-02 DIAGNOSIS — O09299 Supervision of pregnancy with other poor reproductive or obstetric history, unspecified trimester: Secondary | ICD-10-CM

## 2017-11-02 DIAGNOSIS — Z3A32 32 weeks gestation of pregnancy: Secondary | ICD-10-CM

## 2017-11-02 DIAGNOSIS — Z880 Allergy status to penicillin: Secondary | ICD-10-CM | POA: Insufficient documentation

## 2017-11-02 DIAGNOSIS — Z79899 Other long term (current) drug therapy: Secondary | ICD-10-CM | POA: Insufficient documentation

## 2017-11-02 DIAGNOSIS — O99513 Diseases of the respiratory system complicating pregnancy, third trimester: Secondary | ICD-10-CM | POA: Insufficient documentation

## 2017-11-02 DIAGNOSIS — Z302 Encounter for sterilization: Secondary | ICD-10-CM

## 2017-11-02 DIAGNOSIS — J069 Acute upper respiratory infection, unspecified: Secondary | ICD-10-CM

## 2017-11-02 DIAGNOSIS — O368130 Decreased fetal movements, third trimester, not applicable or unspecified: Secondary | ICD-10-CM

## 2017-11-02 DIAGNOSIS — O99330 Smoking (tobacco) complicating pregnancy, unspecified trimester: Secondary | ICD-10-CM

## 2017-11-02 HISTORY — DX: Unspecified abnormal cytological findings in specimens from vagina: R87.629

## 2017-11-02 LAB — URINALYSIS, ROUTINE W REFLEX MICROSCOPIC
Bilirubin Urine: NEGATIVE
Glucose, UA: NEGATIVE mg/dL
Hgb urine dipstick: NEGATIVE
Ketones, ur: NEGATIVE mg/dL
Nitrite: NEGATIVE
Protein, ur: 30 mg/dL — AB
Specific Gravity, Urine: 1.018 (ref 1.005–1.030)
pH: 6 (ref 5.0–8.0)

## 2017-11-02 MED ORDER — AZITHROMYCIN 250 MG PO TABS: ORAL_TABLET | ORAL | 0 refills | Status: DC

## 2017-11-02 MED ORDER — HYDROCOD POLST-CPM POLST ER 10-8 MG/5ML PO SUER: 5.0000 mL | Freq: Two times a day (BID) | ORAL | 0 refills | Status: DC | PRN

## 2017-11-02 NOTE — MAU Note (Signed)
Pt presents with complaint of cold symptoms for 2 weeks, states she is coughing up green stuff, chest hurts when she coughs, temp 100.4 last night. Reports decreased fetal movement for 2 days.

## 2017-11-02 NOTE — Discharge Instructions (Signed)
Cool Mist Vaporizer A cool mist vaporizer is a device that releases a cool mist into the air. If you have a cough or a cold, using a vaporizer may help relieve your symptoms. The mist adds moisture to the air, which may help thin your mucus and make it less sticky. When your mucus is thin and less sticky, it easier for you to breathe and to cough up secretions. Do not use a vaporizer if you are allergic to mold. Follow these instructions at home:  Follow the instructions that come with the vaporizer.  Do not use anything other than distilled water in the vaporizer.  Do not run the vaporizer all of the time. Doing that can cause mold or bacteria to grow in the vaporizer.  Clean the vaporizer after each time that you use it.  Clean and dry the vaporizer well before storing it.  Stop using the vaporizer if your breathing symptoms get worse. This information is not intended to replace advice given to you by your health care provider. Make sure you discuss any questions you have with your health care provider. Document Released: 07/21/2004 Document Revised: 05/13/2016 Document Reviewed: 01/23/2016 Elsevier Interactive Patient Education  2018 [employer]  

## 2017-11-02 NOTE — MAU Provider Note (Signed)
History     CSN: [id number]  Arrival date and time: 11/02/17 1030   First Provider Initiated Contact with Patient 11/02/17 1131      Chief Complaint  Patient presents with  . Decreased Fetal Movement  . Cough   HPI  HPI: Victoria Wright is a 26 y.o. year old 655P2012 female at 2676w3d weeks gestation who presents to MAU reporting cold sx's x 2 wks, coughing up "green stuff", chest pain with coughing, upper back pain with coughing, temp of 100.4 last night, and decreased FM x 2 days. She reports that her son was sick with the "same thing" 2 wks ago, but he got over it "really fast". She's taken Tylenol for the higher temps.   Past Medical History:  Diagnosis Date  . Abnormal Pap smear    follow up was wnl  . Acne   . Hypotension    taken out of work r/t bp drops  . UTI (lower urinary tract infection)   . Vaginal Pap smear, abnormal     Past Surgical History:  Procedure Laterality Date  . WISDOM TOOTH EXTRACTION      Family History  Problem Relation Age of Onset  . Hypertension Mother   . Diabetes Maternal Grandmother   . Cancer Maternal Grandfather   . Anesthesia problems Neg Hx   . Hypotension Neg Hx   . Pseudochol deficiency Neg Hx   . Malignant hyperthermia Neg Hx     Social History   Tobacco Use  . Smoking status: Current Some Day Smoker    Packs/day: 0.50    Types: Cigarettes  . Smokeless tobacco: Never Used  Substance Use Topics  . Alcohol use: No  . Drug use: No    Allergies:  Allergies  Allergen Reactions  . Amoxicillin Rash and Other (See Comments)    Has patient had a PCN reaction causing immediate rash, facial/tongue/throat swelling, SOB or lightheadedness with hypotension: No Has patient had a PCN reaction causing severe rash involving mucus membranes or skin necrosis: No Has patient had a PCN reaction that required hospitalization No Has patient had a PCN reaction occurring within the last 10 years: No If all of the above answers are  "NO", then may proceed with Cephalosporin use.  . Prednisone Rash    Medications Prior to Admission  Medication Sig Dispense Refill Last Dose  . acetaminophen (TYLENOL) 500 MG tablet Take 1 tablet (500 mg total) by mouth every 6 (six) hours as needed. 30 tablet 0 11/01/2017 at Unknown time  . albuterol (PROVENTIL HFA;VENTOLIN HFA) 108 (90 Base) MCG/ACT inhaler Inhale 2 puffs into the lungs every 6 (six) hours as needed for wheezing or shortness of breath. 1 Inhaler 1 prn  . docusate sodium (COLACE) 100 MG capsule Take 1 capsule (100 mg total) by mouth 2 (two) times daily as needed. 30 capsule 2 Past Week at Unknown time  . Prenatal Multivit-Min-Fe-FA (PRENATAL VITAMINS) 0.8 MG tablet Take 1 tablet by mouth daily. 30 tablet 12 Past Week at Unknown time  . Doxylamine-Pyridoxine (DICLEGIS) 10-10 MG TBEC Take 1 tablet by mouth 2 (two) times daily as needed. (Patient not taking: Reported on 10/11/2017) 60 tablet 0 Not Taking  . Elastic Bandages & Supports (COMFORT FIT MATERNITY SUPP MED) MISC Wear daily when ambulating 1 each 0   . famotidine (PEPCID) 40 MG tablet Take 1 tablet (40 mg total) by mouth every evening. (Patient not taking: Reported on 10/11/2017) 30 tablet 1 Not Taking  . metoCLOPramide (REGLAN)  10 MG tablet Take 1 tablet (10 mg total) by mouth every 6 (six) hours. (Patient not taking: Reported on 10/25/2017) 30 tablet 0 Not Taking  . promethazine (PHENERGAN) 25 MG tablet Take 1 tablet (25 mg total) by mouth every 6 (six) hours as needed for nausea or vomiting. (Patient not taking: Reported on 10/11/2017) 30 tablet 0 Not Taking  . terconazole (TERAZOL 7) 0.4 % vaginal cream Place 1 applicator vaginally at bedtime. For 7 days. 45 g 0 Taking    Review of Systems  Constitutional: Negative.   HENT: Positive for congestion and sore throat.   Eyes: Negative.   Respiratory: Positive for cough ("coughing up green stuff") and chest tightness.   Cardiovascular: Negative.   Gastrointestinal:  Negative.   Endocrine: Negative.   Genitourinary:       DFM x 2 days  Musculoskeletal: Positive for back pain (upper back with coughing).  Skin: Negative.   Allergic/Immunologic: Negative.   Neurological: Negative.   Hematological: Negative.   Psychiatric/Behavioral: Negative.    Physical Exam   Blood pressure 128/66, pulse (!) 121, temperature 98.7 F (37.1 C), temperature source Oral, resp. rate 17, height 5\' 6"  (1.676 m), weight 237 lb (107.5 kg), last menstrual period 03/20/2017, SpO2 98 %, unknown if currently breastfeeding.  Physical Exam  Nursing note and vitals reviewed. Constitutional: She is oriented to person, place, and time. She appears well-developed and well-nourished.  HENT:  Head: Normocephalic.  Eyes: Pupils are equal, round, and reactive to light.  Neck: Normal range of motion.  Cardiovascular: Normal rate, regular rhythm and normal heart sounds.  Respiratory: Effort normal and breath sounds normal.     She exhibits tenderness.    Chest wall and Upper back pain c/w pain with excessive coughing  GI: Soft. Bowel sounds are normal.  Genitourinary:  Genitourinary Comments: deferred  Musculoskeletal: Normal range of motion.  Neurological: She is alert and oriented to person, place, and time.  Skin: Skin is warm and dry.  Psychiatric: She has a normal mood and affect. Her behavior is normal. Judgment and thought content normal.    MAU Course  Procedures  MDM CCUA NST - FHR: 130 bpm / moderate variability / accels present / decels absent / TOCO: 1-2 UC's noted on entire NST  Results for orders placed or performed during the hospital encounter of 11/02/17 (from the past 24 hour(s))  Urinalysis, Routine w reflex microscopic     Status: Abnormal   Collection Time: 11/02/17 10:40 AM  Result Value Ref Range   Color, Urine YELLOW YELLOW   APPearance HAZY (A) CLEAR   Specific Gravity, Urine 1.018 1.005 - 1.030   pH 6.0 5.0 - 8.0   Glucose, UA NEGATIVE  NEGATIVE mg/dL   Hgb urine dipstick NEGATIVE NEGATIVE   Bilirubin Urine NEGATIVE NEGATIVE   Ketones, ur NEGATIVE NEGATIVE mg/dL   Protein, ur 30 (A) NEGATIVE mg/dL   Nitrite NEGATIVE NEGATIVE   Leukocytes, UA TRACE (A) NEGATIVE   RBC / HPF 0-5 0 - 5 RBC/hpf   WBC, UA 6-30 0 - 5 WBC/hpf   Bacteria, UA RARE (A) NONE SEEN   Squamous Epithelial / LPF 0-5 (A) NONE SEEN   Mucus PRESENT       Assessment and Plan  Upper respiratory tract infection, unspecified type - Rx for Z-pak, Tussionex - Continue Tylenol 1000 mg prn  - Throat lozenges or Chloraseptic spray  - Increase daily water intake - Increase rest  - Keep scheduled appt with [facility] -  Discharge home - Patient verbalized an understanding of the plan of care and agrees.   Victoria Mora, MSN, CNM 11/02/2017, 11:31 AM

## 2017-11-07 NOTE — L&D Delivery Note (Signed)
Patient is 27 y.o. [id number] 1537w0d [facility].   Delivery Note At 0016 a viable female was delivered via  SVD, Presentation: cephalic,LOA. APGAR: 9,9 ; weight pending.   Placenta status: spontaneous, intact. Cord: 3 vessel  Anesthesia:  none Episiotomy:  none Lacerations:  1st degree Suture Repair: n/a Est. Blood Loss (mL): 300  Mom to postpartum.  Baby to Couplet care / Skin to Skin.  Rolm BookbinderAmber Mora Pedraza, DO [facility]

## 2017-11-08 ENCOUNTER — Encounter: Payer: Medicaid Other | Admitting: Obstetrics and Gynecology

## 2017-11-08 ENCOUNTER — Ambulatory Visit (INDEPENDENT_AMBULATORY_CARE_PROVIDER_SITE_OTHER): Payer: Medicaid Other | Admitting: Obstetrics and Gynecology

## 2017-11-08 ENCOUNTER — Encounter: Payer: Self-pay | Admitting: Obstetrics and Gynecology

## 2017-11-08 ENCOUNTER — Encounter: Payer: Medicaid Other | Admitting: Obstetrics

## 2017-11-08 VITALS — BP 130/78 | HR 102 | Wt 239.8 lb

## 2017-11-08 DIAGNOSIS — Z302 Encounter for sterilization: Secondary | ICD-10-CM

## 2017-11-08 DIAGNOSIS — Z3480 Encounter for supervision of other normal pregnancy, unspecified trimester: Secondary | ICD-10-CM

## 2017-11-08 NOTE — Progress Notes (Signed)
Pt c/o vaginal pressure with walking.

## 2017-11-08 NOTE — Progress Notes (Signed)
   PRENATAL VISIT NOTE  Subjective:  Victoria Wright is a 27 y.o. [phone] at 1252w2d being seen today for ongoing prenatal care.  She is currently monitored for the following issues for this low-risk pregnancy and has Encounter for supervision of normal pregnancy, antepartum; History of macrosomia in infant in prior pregnancy, currently pregnant; Susceptible to Varicella (non-immune), currently pregnant in first trimester; Tobacco use affecting pregnancy, antepartum; Request for sterilization; Vaginal discharge during pregnancy; Unwanted fertility; and Upper respiratory infection on their problem list.  Patient reports no complaints.  Contractions: Irregular. Vag. Bleeding: None.  Movement: Present. Denies leaking of fluid.   The following portions of the patient's history were reviewed and updated as appropriate: allergies, current medications, past family history, past medical history, past social history, past surgical history and problem list. Problem list updated.  Objective:   Vitals:   11/08/17 1552  BP: 130/78  Pulse: (!) 102  Weight: 239 lb 12.8 oz (108.8 kg)    Fetal Status: Fetal Heart Rate (bpm): 138 Fundal Height: 33 cm Movement: Present     General:  Alert, oriented and cooperative. Patient is in no acute distress.  Skin: Skin is warm and dry. No rash noted.   Cardiovascular: Normal heart rate noted  Respiratory: Normal respiratory effort, no problems with respiration noted  Abdomen: Soft, gravid, appropriate for gestational age.  Pain/Pressure: Present     Pelvic: Cervical exam deferred        Extremities: Normal range of motion.  Edema: Trace  Mental Status:  Normal mood and affect. Normal behavior. Normal judgment and thought content.   Assessment and Plan:  Pregnancy: [id number] at 7852w2d  1. Supervision of other normal pregnancy, antepartum Patient is doing well  She reports improvement in her pelvic pressure with maternity support belt  2. Request for  sterilization Consent signed 12/5  Preterm labor symptoms and general obstetric precautions including but not limited to vaginal bleeding, contractions, leaking of fluid and fetal movement were reviewed in detail with the patient. Please refer to After Visit Summary for other counseling recommendations.  Return in about 2 weeks (around 11/22/2017) for ROB.   Catalina AntiguaPeggy Misaki Sozio, MD

## 2017-11-09 ENCOUNTER — Telehealth: Payer: Self-pay

## 2017-11-09 NOTE — Telephone Encounter (Signed)
Returned call and pt stated that she thinks she lost her mucous plug, pt denies contractions, reports good fetal movement. Advised of signs of preterm labor and [facility], pt agreed.

## 2017-11-15 ENCOUNTER — Encounter: Payer: Self-pay | Admitting: *Deleted

## 2017-11-22 ENCOUNTER — Encounter: Payer: Self-pay | Admitting: Obstetrics and Gynecology

## 2017-11-22 ENCOUNTER — Ambulatory Visit (INDEPENDENT_AMBULATORY_CARE_PROVIDER_SITE_OTHER): Payer: Medicaid Other | Admitting: Obstetrics and Gynecology

## 2017-11-22 VITALS — BP 106/70 | HR 99 | Wt 243.6 lb

## 2017-11-22 DIAGNOSIS — Z3483 Encounter for supervision of other normal pregnancy, third trimester: Secondary | ICD-10-CM

## 2017-11-22 DIAGNOSIS — Z3490 Encounter for supervision of normal pregnancy, unspecified, unspecified trimester: Secondary | ICD-10-CM

## 2017-11-22 NOTE — Progress Notes (Signed)
c/o: Diarrhea x 1 week now and spotting after intercourse. Pt wants to wait next week at 36 weeks for GBS

## 2017-11-22 NOTE — Progress Notes (Signed)
   PRENATAL VISIT NOTE  Subjective:  Victoria Wright is a 27 y.o. [phone] at 6850w2d being seen today for ongoing prenatal care.  She is currently monitored for the following issues for this low-risk pregnancy and has Encounter for supervision of normal pregnancy, antepartum; History of macrosomia in infant in prior pregnancy, currently pregnant; Susceptible to Varicella (non-immune), currently pregnant in first trimester; Tobacco use affecting pregnancy, antepartum; Request for sterilization; Vaginal discharge during pregnancy; Unwanted fertility; and Upper respiratory infection on their problem list.  Patient reports 2-3 soft stools per day for the past week.  Contractions: Not present. Vag. Bleeding: Scant.  Movement: Present. Denies leaking of fluid.   The following portions of the patient's history were reviewed and updated as appropriate: allergies, current medications, past family history, past medical history, past social history, past surgical history and problem list. Problem list updated.  Objective:   Vitals:   11/22/17 0859  BP: 106/70  Pulse: 99  Weight: 243 lb 9.6 oz (110.5 kg)    Fetal Status: Fetal Heart Rate (bpm): 150 Fundal Height: 35 cm Movement: Present     General:  Alert, oriented and cooperative. Patient is in no acute distress.  Skin: Skin is warm and dry. No rash noted.   Cardiovascular: Normal heart rate noted  Respiratory: Normal respiratory effort, no problems with respiration noted  Abdomen: Soft, gravid, appropriate for gestational age.  Pain/Pressure: Absent     Pelvic: Cervical exam deferred        Extremities: Normal range of motion.  Edema: None  Mental Status:  Normal mood and affect. Normal behavior. Normal judgment and thought content.   Assessment and Plan:  Pregnancy: [id number] at 3750w2d  1. Encounter for supervision of normal pregnancy, antepartum, unspecified gravidity Patient is doing well Encouraged the patient to monitor amount of bowel  movement. She should also stay well hydrated Reassurance provided regarding postcoital spotting.  Cultures and cervical exam next  visit  Preterm labor symptoms and general obstetric precautions including but not limited to vaginal bleeding, contractions, leaking of fluid and fetal movement were reviewed in detail with the patient. Please refer to After Visit Summary for other counseling recommendations.  Return in about 1 week (around 11/29/2017) for ROB.   Catalina AntiguaPeggy Sable Knoles, MD

## 2017-11-26 ENCOUNTER — Inpatient Hospital Stay (HOSPITAL_COMMUNITY): Payer: Medicaid Other

## 2017-11-26 ENCOUNTER — Encounter (HOSPITAL_COMMUNITY): Payer: Self-pay

## 2017-11-26 ENCOUNTER — Inpatient Hospital Stay (HOSPITAL_COMMUNITY)
Admission: AD | Admit: 2017-11-26 | Discharge: 2017-11-26 | Disposition: A | Discharge status: Home / Self Care | Payer: Medicaid Other | Source: Ambulatory Visit | Attending: Obstetrics and Gynecology | Admitting: Obstetrics and Gynecology

## 2017-11-26 DIAGNOSIS — Z3A35 35 weeks gestation of pregnancy: Secondary | ICD-10-CM | POA: Diagnosis not present

## 2017-11-26 DIAGNOSIS — O368390 Maternal care for abnormalities of the fetal heart rate or rhythm, unspecified trimester, not applicable or unspecified: Secondary | ICD-10-CM | POA: Diagnosis not present

## 2017-11-26 DIAGNOSIS — Z880 Allergy status to penicillin: Secondary | ICD-10-CM | POA: Insufficient documentation

## 2017-11-26 DIAGNOSIS — O133 Gestational [pregnancy-induced] hypertension without significant proteinuria, third trimester: Secondary | ICD-10-CM | POA: Diagnosis not present

## 2017-11-26 DIAGNOSIS — O4703 False labor before 37 completed weeks of gestation, third trimester: Secondary | ICD-10-CM

## 2017-11-26 LAB — COMPREHENSIVE METABOLIC PANEL
ALT: 22 U/L (ref 14–54)
AST: 24 U/L (ref 15–41)
Albumin: 3 g/dL — ABNORMAL LOW (ref 3.5–5.0)
Alkaline Phosphatase: 83 U/L (ref 38–126)
Anion gap: 10 (ref 5–15)
BUN: 5 mg/dL — ABNORMAL LOW (ref 6–20)
CO2: 19 mmol/L — ABNORMAL LOW (ref 22–32)
Calcium: 8.6 mg/dL — ABNORMAL LOW (ref 8.9–10.3)
Chloride: 105 mmol/L (ref 101–111)
Creatinine, Ser: 0.39 mg/dL — ABNORMAL LOW (ref 0.44–1.00)
GFR calc Af Amer: >60 mL/min (ref >60)
GFR calc non Af Amer: >60 mL/min (ref >60)
Glucose, Bld: 108 mg/dL — ABNORMAL HIGH (ref 65–99)
Potassium: 3.1 mmol/L — ABNORMAL LOW (ref 3.5–5.1)
Sodium: 134 mmol/L — ABNORMAL LOW (ref 135–145)
Total Bilirubin: 0.1 mg/dL — ABNORMAL LOW (ref 0.3–1.2)
Total Protein: 6.6 g/dL (ref 6.5–8.1)

## 2017-11-26 LAB — CBC
HCT: 34.4 % — ABNORMAL LOW (ref 36.0–46.0)
Hemoglobin: 12 g/dL (ref 12.0–15.0)
MCH: 31.3 pg (ref 26.0–34.0)
MCHC: 34.9 g/dL (ref 30.0–36.0)
MCV: 89.6 fL (ref 78.0–100.0)
Platelets: 177 10*3/uL (ref 150–400)
RBC: 3.84 MIL/uL — ABNORMAL LOW (ref 3.87–5.11)
RDW: 13.8 % (ref 11.5–15.5)
WBC: 8.6 10*3/uL (ref 4.0–10.5)

## 2017-11-26 LAB — URINALYSIS, ROUTINE W REFLEX MICROSCOPIC
Bilirubin Urine: NEGATIVE
Glucose, UA: NEGATIVE mg/dL
Hgb urine dipstick: NEGATIVE
Ketones, ur: NEGATIVE mg/dL
Nitrite: NEGATIVE
Protein, ur: NEGATIVE mg/dL
Specific Gravity, Urine: 1.01 (ref 1.005–1.030)
pH: 7 (ref 5.0–8.0)

## 2017-11-26 LAB — PROTEIN / CREATININE RATIO, URINE
Creatinine, Urine: 76 mg/dL
Protein Creatinine Ratio: 0.18 mg/mg{Cre} — ABNORMAL HIGH (ref 0.00–0.15)
Total Protein, Urine: 14 mg/dL

## 2017-11-26 NOTE — MAU Provider Note (Signed)
CC:  Chief Complaint  Patient presents with  . Contractions     First Provider Initiated Contact with Patient 11/26/17 0305      HPI: Victoria Wright is a 27 y.o. year old G76P2012 female at [redacted]w[redacted]d weeks gestation who presents to MAU reporting contractions every 5 minutes since 2300. Elevated BP x 1 150/78. No Hx HTN in this or prior pregnancy.   Associated Sx:  Vaginal bleeding: Scant bloody show earlier this week. None now. Leaking of fluid: Denies Fetal movement: Nml Denies HA, vision changes or epigastric pain.  O:  Patient Vitals for the past 24 hrs:  BP Pulse  11/26/17 0346 (!) 152/75 89  11/26/17 0339 (!) 145/72 91  11/26/17 0316 134/71 86  11/26/17 0301 133/74 100  11/26/17 0245 124/77 99  11/26/17 0231 100/73 (!) 101  11/26/17 0217 132/76 97  11/26/17 0207 (!) 150/78 97    General: NAD Heart: Regular rate Lungs: Normal rate and effort Abd: Soft, NT, Gravid, S=D Pelvic: NEFG, neg LOF, Neg blood.  Dilation: 3 Effacement (%): 70, 80 Station: -2 Vtx Exam by:: Dorathy Kinsman, CNM  EFM: 135, Moderate variability, 15 x 15 accelerations, no decelerations initially, but then had one or possibly two variables. Toco: Contractions every 3-5 minutes, mild-mod  RESULTS Results for orders placed or performed during the hospital encounter of 11/26/17 (from the past 24 hour(s))  Urinalysis, Routine w reflex microscopic     Status: Abnormal   Collection Time: 11/26/17  3:30 AM  Result Value Ref Range   Color, Urine YELLOW YELLOW   APPearance CLEAR CLEAR   Specific Gravity, Urine 1.010 1.005 - 1.030   pH 7.0 5.0 - 8.0   Glucose, UA NEGATIVE NEGATIVE mg/dL   Hgb urine dipstick NEGATIVE NEGATIVE   Bilirubin Urine NEGATIVE NEGATIVE   Ketones, ur NEGATIVE NEGATIVE mg/dL   Protein, ur NEGATIVE NEGATIVE mg/dL   Nitrite NEGATIVE NEGATIVE   Leukocytes, UA MODERATE (A) NEGATIVE   RBC / HPF 0-5 0 - 5 RBC/hpf   WBC, UA 0-5 0 - 5 WBC/hpf   Bacteria, UA RARE (A) NONE SEEN   Squamous Epithelial / LPF 0-5 (A) NONE SEEN   Mucus PRESENT   Protein / creatinine ratio, urine     Status: Abnormal   Collection Time: 11/26/17  3:30 AM  Result Value Ref Range   Creatinine, Urine 76.00 mg/dL   Total Protein, Urine 14 mg/dL   Protein Creatinine Ratio 0.18 (H) 0.00 - 0.15 mg/mg[Cre]  CBC     Status: Abnormal   Collection Time: 11/26/17  3:37 AM  Result Value Ref Range   WBC 8.6 4.0 - 10.5 K/uL   RBC 3.84 (L) 3.87 - 5.11 MIL/uL   Hemoglobin 12.0 12.0 - 15.0 g/dL   HCT 16.1 (L) 09.6 - 04.5 %   MCV 89.6 78.0 - 100.0 fL   MCH 31.3 26.0 - 34.0 pg   MCHC 34.9 30.0 - 36.0 g/dL   RDW 40.9 81.1 - 91.4 %   Platelets 177 150 - 400 K/uL  Comprehensive metabolic panel     Status: Abnormal   Collection Time: 11/26/17  3:37 AM  Result Value Ref Range   Sodium 134 (L) 135 - 145 mmol/L   Potassium 3.1 (L) 3.5 - 5.1 mmol/L   Chloride 105 101 - 111 mmol/L   CO2 19 (L) 22 - 32 mmol/L   Glucose, Bld 108 (H) 65 - 99 mg/dL   BUN 5 (L) 6 - 20 mg/dL  Creatinine, Ser 0.39 (L) 0.44 - 1.00 mg/dL   Calcium 8.6 (L) 8.9 - 10.3 mg/dL   Total Protein 6.6 6.5 - 8.1 g/dL   Albumin 3.0 (L) 3.5 - 5.0 g/dL   AST 24 15 - 41 U/L   ALT 22 14 - 54 U/L   Alkaline Phosphatase 83 38 - 126 U/L   Total Bilirubin 0.1 (L) 0.3 - 1.2 mg/dL   GFR calc non Af Amer >60 >60 mL/min   GFR calc Af Amer >60 >60 mL/min   Anion gap 10 5 - 15    Orders Placed This Encounter  Procedures  . CBC  . Comprehensive metabolic panel  . Urinalysis, Routine w reflex microscopic  . Protein / creatinine ratio, urine   MDM - Preterm contractions w/out cervical change over 2+ hours - Transient HTN in pregnancy w/out evidence of Pre-E. BP check at office visit in 3 days. If elevated will likely need to be induced at 37 weeks. Pre-E precautions.  - Variable decel w/ otherwise reactive NST and 8/8 BPP. Active fetus. No further decels on prolonged monitoring.   A: 4534w6d week IUP Preterm contractions Variable decels w/ 8/8  BPP Transient HTN  P: Discharge home in stable condition. Preterm labor contractions and Fetal Kick Counts  Pre-E precautions.  Follow-up Information    [facility] up on 11/29/2017.   Why:  ROB and BP check Contact information: [address] [place] [id number] [phone]       [facility] Follow up.   Why:  as neede if symptoms worsen Contact information: [address] [id number] [place] [phone]          Allergies as of 11/26/2017      Reactions   Amoxicillin Rash, Other (See Comments)   Has patient had a PCN reaction causing immediate rash, facial/tongue/throat swelling, SOB or lightheadedness with hypotension: No Has patient had a PCN reaction causing severe rash involving mucus membranes or skin necrosis: No Has patient had a PCN reaction that required hospitalization No Has patient had a PCN reaction occurring within the last 10 years: No If all of the above answers are "NO", then may proceed with Cephalosporin use.   Prednisone Rash      Medication List    TAKE these medications   acetaminophen 500 MG tablet Commonly known as:  TYLENOL Take 1 tablet (500 mg total) by mouth every 6 (six) hours as needed.   albuterol 108 (90 Base) MCG/ACT inhaler Commonly known as:  PROVENTIL HFA;VENTOLIN HFA Inhale 2 puffs into the lungs every 6 (six) hours as needed for wheezing or shortness of breath.   azithromycin 250 MG tablet Commonly known as:  ZITHROMAX Z-PAK 500 mg by mouth on day 1, then 250 mg by mouth every day for 4 days   chlorpheniramine-HYDROcodone 10-8 MG/5ML Suer Commonly known as:  TUSSIONEX PENNKINETIC ER Take 5 mLs by mouth every 12 (twelve) hours as needed for cough.   COMFORT FIT MATERNITY SUPP MED Misc Wear daily when ambulating   docusate sodium 100 MG capsule Commonly known as:  COLACE Take 1 capsule (100  mg total) by mouth 2 (two) times daily as needed.   Prenatal Vitamins 0.8 MG tablet Take 1 tablet by mouth daily.        Katrinka BlazingSmith, [place], CNM 11/26/2017 4:08 AM  3

## 2017-11-26 NOTE — MAU Note (Signed)
CTX since 2300, now getting worse-approximately every 4-5 mins.  No complications with pregnancy.  No bleeding/LOF.  Reports good FM.

## 2017-11-26 NOTE — Discharge Instructions (Signed)
Preeclampsia and Eclampsia °Preeclampsia is a serious condition that develops only during pregnancy. It is also called toxemia of pregnancy. This condition causes high blood pressure along with other symptoms, such as swelling and headaches. These symptoms may develop as the condition gets worse. Preeclampsia may occur at 20 weeks of pregnancy or later. °Diagnosing and treating preeclampsia early is very important. If not treated early, it can cause serious problems for you and your baby. One problem it can lead to is eclampsia, which is a condition that causes muscle jerking or shaking (convulsions or seizures) in the mother. Delivering your baby is the best treatment for preeclampsia or eclampsia. Preeclampsia and eclampsia symptoms usually go away after your baby is born. °What are the causes? °The cause of preeclampsia is not known. °What increases the risk? °The following risk factors make you more likely to develop preeclampsia: °· Being pregnant for the first time. °· Having had preeclampsia during a past pregnancy. °· Having a family history of preeclampsia. °· Having high blood pressure. °· Being pregnant with twins or triplets. °· Being 35 or older. °· Being African-American. °· Having kidney disease or diabetes. °· Having medical conditions such as lupus or blood diseases. °· Being very overweight (obese). ° °What are the signs or symptoms? °The earliest signs of preeclampsia are: °· High blood pressure. °· Increased protein in your urine. Your health care provider will check for this at every visit before you give birth (prenatal visit). ° °Other symptoms that may develop as the condition gets worse include: °· Severe headaches. °· Sudden weight gain. °· Swelling of the hands, face, legs, and feet. °· Nausea and vomiting. °· Vision problems, such as blurred or double vision. °· Numbness in the face, arms, legs, and feet. °· Urinating less than usual. °· Dizziness. °· Slurred speech. °· Abdominal pain,  especially upper abdominal pain. °· Convulsions or seizures. ° °Symptoms generally go away after giving birth. °How is this diagnosed? °There are no screening tests for preeclampsia. Your health care provider will ask you about symptoms and check for signs of preeclampsia during your prenatal visits. You may also have tests that include: °· Urine tests. °· Blood tests. °· Checking your blood pressure. °· Monitoring your baby’s heart rate. °· Ultrasound. ° °How is this treated? °You and your health care provider will determine the treatment approach that is best for you. Treatment may include: °· Having more frequent prenatal exams to check for signs of preeclampsia, if you have an increased risk for preeclampsia. °· Bed rest. °· Reducing how much salt (sodium) you eat. °· Medicine to lower your blood pressure. °· Staying in the hospital, if your condition is severe. There, treatment will focus on controlling your blood pressure and the amount of fluids in your body (fluid retention). °· You may need to take medicine (magnesium sulfate) to prevent seizures. This medicine may be given as an injection or through an IV tube. °· Delivering your baby early, if your condition gets worse. You may have your labor started with medicine (induced), or you may have a cesarean delivery. ° °Follow these instructions at home: °Eating and drinking ° °· Drink enough fluid to keep your urine clear or pale yellow. °· Eat a healthy diet that is low in sodium. Do not add salt to your food. Check nutrition labels to see how much sodium a food or beverage contains. °· Avoid caffeine. °Lifestyle °· Do not use any products that contain nicotine or tobacco, such as cigarettes   and e-cigarettes. If you need help quitting, ask your health care provider.  Do not use alcohol or drugs.  Avoid stress as much as possible. Rest and get plenty of sleep. General instructions  Take over-the-counter and prescription medicines only as told by your  health care provider.  When lying down, lie on your side. This keeps pressure off of your baby.  When sitting or lying down, raise (elevate) your feet. Try putting some pillows underneath your lower legs.  Exercise regularly. Ask your health care provider what kinds of exercise are best for you.  Keep all follow-up and prenatal visits as told by your health care provider. This is important. How is this prevented? To prevent preeclampsia or eclampsia from developing during another pregnancy:  Get proper medical care during pregnancy. Your health care provider may be able to prevent preeclampsia or diagnose and treat it early.  Your health care provider may have you take a low-dose aspirin or a calcium supplement during your next pregnancy.  You may have tests of your blood pressure and kidney function after giving birth.  Maintain a healthy weight. Ask your health care provider for help managing weight gain during pregnancy.  Work with your health care provider to manage any long-term (chronic) health conditions you have, such as diabetes or kidney problems.  Contact a health care provider if:  You gain more weight than expected.  You have headaches.  You have nausea or vomiting.  You have abdominal pain.  You feel dizzy or light-headed. Get help right away if:  You develop sudden or severe swelling anywhere in your body. This usually happens in the legs.  You gain 5 lbs (2.3 kg) or more during one week.  You have severe: ? Abdominal pain. ? Headaches. ? Dizziness. ? Vision problems. ? Confusion. ? Nausea or vomiting.  You have a seizure.  You have trouble moving any part of your body.  You develop numbness in any part of your body.  You have trouble speaking.  You have any abnormal bleeding.  You pass out. This information is not intended to replace advice given to you by your health care provider. Make sure you discuss any questions you have with your health  care provider. Document Released: 10/21/2000 Document Revised: 06/21/2016 Document Reviewed: 05/30/2016 Elsevier Interactive Patient Education  2018 [employer].  [employer] of the uterus can occur throughout pregnancy, but they are not always a sign that you are in labor. You may have practice contractions called Braxton Hicks contractions. These false labor contractions are sometimes confused with true labor. What are Deberah Pelton contractions? Braxton Hicks contractions are tightening movements that occur in the muscles of the uterus before labor. Unlike true labor contractions, these contractions do not result in opening (dilation) and thinning of the cervix. Toward the end of pregnancy (32-34 weeks), Braxton Hicks contractions can happen more often and may become stronger. These contractions are sometimes difficult to tell apart from true labor because they can be very uncomfortable. You should not feel embarrassed if you go to the hospital with false labor. Sometimes, the only way to tell if you are in true labor is for your health care provider to look for changes in the cervix. The health care provider will do a physical exam and may monitor your contractions. If you are not in true labor, the exam should show that your cervix is not dilating and your water has not broken. If there are other health problems  associated with your pregnancy, it is completely safe for you to be sent home with false labor. You may continue to have Braxton Hicks contractions until you go into true labor. How to tell the difference between true labor and false labor True labor  Contractions last 30-70 seconds.  Contractions become very regular.  Discomfort is usually felt in the top of the uterus, and it spreads to the lower abdomen and low back.  Contractions do not go away with walking.  Contractions usually become more intense and increase in frequency.  The cervix dilates  and gets thinner. False labor  Contractions are usually shorter and not as strong as true labor contractions.  Contractions are usually irregular.  Contractions are often felt in the front of the lower abdomen and in the groin.  Contractions may go away when you walk around or change positions while lying down.  Contractions get weaker and are shorter-lasting as time goes on.  The cervix usually does not dilate or become thin. Follow these instructions at home:  Take over-the-counter and prescription medicines only as told by your health care provider.  Keep up with your usual exercises and follow other instructions from your health care provider.  Eat and drink lightly if you think you are going into labor.  If Braxton Hicks contractions are making you uncomfortable: ? Change your position from lying down or resting to walking, or change from walking to resting. ? Sit and rest in a tub of warm water. ? Drink enough fluid to keep your urine pale yellow. Dehydration may cause these contractions. ? Do slow and deep breathing several times an hour.  Keep all follow-up prenatal visits as told by your health care provider. This is important. Contact a health care provider if:  You have a fever.  You have continuous pain in your abdomen. Get help right away if:  Your contractions become stronger, more regular, and closer together.  You have fluid leaking or gushing from your vagina.  You pass blood-tinged mucus (bloody show).  You have bleeding from your vagina.  You have low back pain that you never had before.  You feel your babys head pushing down and causing pelvic pressure.  Your baby is not moving inside you as much as it used to. Summary  Contractions that occur before labor are called Braxton Hicks contractions, false labor, or practice contractions.  Braxton Hicks contractions are usually shorter, weaker, farther apart, and less regular than true labor  contractions. True labor contractions usually become progressively stronger and regular and they become more frequent.  [facility]age discomfort from [facility] by changing position, resting in a warm bath, drinking plenty of water, or practicing deep breathing. This information is not intended to replace advice given to you by your health care provider. Make sure you discuss any questions you have with your health care provider. Document Released: 03/09/2017 Document Revised: 03/09/2017 Document Reviewed: 03/09/2017 Elsevier Interactive Patient Education  2018 [employer]

## 2017-11-28 ENCOUNTER — Encounter: Payer: Self-pay | Admitting: Advanced Practice Midwife

## 2017-11-28 DIAGNOSIS — O133 Gestational [pregnancy-induced] hypertension without significant proteinuria, third trimester: Secondary | ICD-10-CM | POA: Insufficient documentation

## 2017-11-28 DIAGNOSIS — O288 Other abnormal findings on antenatal screening of mother: Secondary | ICD-10-CM | POA: Insufficient documentation

## 2017-11-29 ENCOUNTER — Encounter: Payer: Self-pay | Admitting: Obstetrics and Gynecology

## 2017-11-29 ENCOUNTER — Ambulatory Visit (INDEPENDENT_AMBULATORY_CARE_PROVIDER_SITE_OTHER): Payer: Medicaid Other | Admitting: Obstetrics and Gynecology

## 2017-11-29 ENCOUNTER — Other Ambulatory Visit (HOSPITAL_COMMUNITY)
Admission: RE | Admit: 2017-11-29 | Discharge: 2017-11-29 | Disposition: A | Discharge status: Home / Self Care | Payer: Medicaid Other | Source: Ambulatory Visit | Attending: Obstetrics and Gynecology | Admitting: Obstetrics and Gynecology

## 2017-11-29 VITALS — BP 136/76 | HR 112 | Wt 243.1 lb

## 2017-11-29 DIAGNOSIS — O133 Gestational [pregnancy-induced] hypertension without significant proteinuria, third trimester: Secondary | ICD-10-CM

## 2017-11-29 DIAGNOSIS — O09891 Supervision of other high risk pregnancies, first trimester: Secondary | ICD-10-CM

## 2017-11-29 DIAGNOSIS — O288 Other abnormal findings on antenatal screening of mother: Secondary | ICD-10-CM

## 2017-11-29 DIAGNOSIS — Z283 Underimmunization status: Secondary | ICD-10-CM

## 2017-11-29 DIAGNOSIS — Z3490 Encounter for supervision of normal pregnancy, unspecified, unspecified trimester: Secondary | ICD-10-CM | POA: Insufficient documentation

## 2017-11-29 DIAGNOSIS — Z3009 Encounter for other general counseling and advice on contraception: Secondary | ICD-10-CM

## 2017-11-29 DIAGNOSIS — O09893 Supervision of other high risk pregnancies, third trimester: Secondary | ICD-10-CM

## 2017-11-29 DIAGNOSIS — Z2839 Other underimmunization status: Secondary | ICD-10-CM

## 2017-11-29 NOTE — Patient Instructions (Signed)
Vaginal Delivery Vaginal delivery means that you will give birth by pushing your baby out of your birth canal (vagina). A team of health care providers will help you before, during, and after vaginal delivery. Birth experiences are unique for every woman and every pregnancy, and birth experiences vary depending on where you choose to give birth. What should I do to prepare for my baby's birth? Before your baby is born, it is important to talk with your health care provider about:  Your labor and delivery preferences. These may include: ? Medicines that you may be given. ? How you will manage your pain. This might include non-medical pain relief techniques or injectable pain relief such as epidural analgesia. ? How you and your baby will be monitored during labor and delivery. ? Who may be in the labor and delivery room with you. ? Your feelings about surgical delivery of your baby (cesarean delivery, or C-section) if this becomes necessary. ? Your feelings about receiving donated blood through an IV tube (blood transfusion) if this becomes necessary.  Whether you are able: ? To take pictures or videos of the birth. ? To eat during labor and delivery. ? To move around, walk, or change positions during labor and delivery.  What to expect after your baby is born, such as: ? Whether delayed umbilical cord clamping and cutting is offered. ? Who will care for your baby right after birth. ? Medicines or tests that may be recommended for your baby. ? Whether breastfeeding is supported in your hospital or birth center. ? How long you will be in the hospital or birth center.  How any medical conditions you have may affect your baby or your labor and delivery experience.  To prepare for your baby's birth, you should also:  Attend all of your health care visits before delivery (prenatal visits) as recommended by your health care provider. This is important.  Prepare your home for your baby's  arrival. Make sure that you have: ? Diapers. ? Baby clothing. ? Feeding equipment. ? Safe sleeping arrangements for you and your baby.  Install a car seat in your vehicle. Have your car seat checked by a certified car seat installer to make sure that it is installed safely.  Think about who will help you with your new baby at home for at least the first several weeks after delivery.  What can I expect when I arrive at the birth center or hospital? Once you are in labor and have been admitted into the hospital or birth center, your health care provider may:  Review your pregnancy history and any concerns you have.  Insert an IV tube into one of your veins. This is used to give you fluids and medicines.  Check your blood pressure, pulse, temperature, and heart rate (vital signs).  Check whether your bag of water (amniotic sac) has broken (ruptured).  Talk with you about your birth plan and discuss pain control options.  Monitoring Your health care provider may monitor your contractions (uterine monitoring) and your baby's heart rate (fetal monitoring). You may need to be monitored:  Often, but not continuously (intermittently).  All the time or for long periods at a time (continuously). Continuous monitoring may be needed if: ? You are taking certain medicines, such as medicine to relieve pain or make your contractions stronger. ? You have pregnancy or labor complications.  Monitoring may be done by:  Placing a special stethoscope or a handheld monitoring device on your abdomen to   check your baby's heartbeat, and feeling your abdomen for contractions. This method of monitoring does not continuously record your baby's heartbeat or your contractions.  Placing monitors on your abdomen (external monitors) to record your baby's heartbeat and the frequency and length of contractions. You may not have to wear external monitors all the time.  Placing monitors inside of your uterus  (internal monitors) to record your baby's heartbeat and the frequency, length, and strength of your contractions. ? Your health care provider may use internal monitors if he or she needs more information about the strength of your contractions or your baby's heart rate. ? Internal monitors are put in place by passing a thin, flexible wire through your vagina and into your uterus. Depending on the type of monitor, it may remain in your uterus or on your baby's head until birth. ? Your health care provider will discuss the benefits and risks of internal monitoring with you and will ask for your permission before inserting the monitors.  Telemetry. This is a type of continuous monitoring that can be done with external or internal monitors. Instead of having to stay in bed, you are able to move around during telemetry. Ask your health care provider if telemetry is an option for you.  Physical exam Your health care provider may perform a physical exam. This may include:  Checking whether your baby is positioned: ? With the head toward your vagina (head-down). This is most common. ? With the head toward the top of your uterus (head-up or breech). If your baby is in a breech position, your health care provider may try to turn your baby to a head-down position so you can deliver vaginally. If it does not seem that your baby can be born vaginally, your provider may recommend surgery to deliver your baby. In rare cases, you may be able to deliver vaginally if your baby is head-up (breech delivery). ? Lying sideways (transverse). Babies that are lying sideways cannot be delivered vaginally.  Checking your cervix to determine: ? Whether it is thinning out (effacing). ? Whether it is opening up (dilating). ? How low your baby has moved into your birth canal.  What are the three stages of labor and delivery?  Normal labor and delivery is divided into the following three stages: Stage 1  Stage 1 is the  longest stage of labor, and it can last for hours or days. Stage 1 includes: ? Early labor. This is when contractions may be irregular, or regular and mild. Generally, early labor contractions are more than 10 minutes apart. ? Active labor. This is when contractions get longer, more regular, more frequent, and more intense. ? The transition phase. This is when contractions happen very close together, are very intense, and may last longer than during any other part of labor.  Contractions generally feel mild, infrequent, and irregular at first. They get stronger, more frequent (about every 2-3 minutes), and more regular as you progress from early labor through active labor and transition.  Many women progress through stage 1 naturally, but you may need help to continue making progress. If this happens, your health care provider may talk with you about: ? Rupturing your amniotic sac if it has not ruptured yet. ? Giving you medicine to help make your contractions stronger and more frequent.  Stage 1 ends when your cervix is completely dilated to 4 inches (10 cm) and completely effaced. This happens at the end of the transition phase. Stage 2  Once   your cervix is completely effaced and dilated to 4 inches (10 cm), you may start to feel an urge to push. It is common for the body to naturally take a rest before feeling the urge to push, especially if you received an epidural or certain other pain medicines. This rest period may last for up to 1-2 hours, depending on your unique labor experience.  During stage 2, contractions are generally less painful, because pushing helps relieve contraction pain. Instead of contraction pain, you may feel stretching and burning pain, especially when the widest part of your baby's head passes through the vaginal opening (crowning).  Your health care provider will closely monitor your pushing progress and your baby's progress through the vagina during stage 2.  Your  health care provider may massage the area of skin between your vaginal opening and anus (perineum) or apply warm compresses to your perineum. This helps it stretch as the baby's head starts to crown, which can help prevent perineal tearing. ? In some cases, an incision may be made in your perineum (episiotomy) to allow the baby to pass through the vaginal opening. An episiotomy helps to make the opening of the vagina larger to allow more room for the baby to fit through.  It is very important to breathe and focus so your health care provider can control the delivery of your baby's head. Your health care provider may have you decrease the intensity of your pushing, to help prevent perineal tearing.  After delivery of your baby's head, the shoulders and the rest of the body generally deliver very quickly and without difficulty.  Once your baby is delivered, the umbilical cord may be cut right away, or this may be delayed for 1-2 minutes, depending on your baby's health. This may vary among health care providers, hospitals, and birth centers.  If you and your baby are healthy enough, your baby may be placed on your chest or abdomen to help maintain the baby's temperature and to help you bond with each other. Some mothers and babies start breastfeeding at this time. Your health care team will dry your baby and help keep your baby warm during this time.  Your baby may need immediate care if he or she: ? Showed signs of distress during labor. ? Has a medical condition. ? Was born too early (prematurely). ? Had a bowel movement before birth (meconium). ? Shows signs of difficulty transitioning from being inside the uterus to being outside of the uterus. If you are planning to breastfeed, your health care team will help you begin a feeding. Stage 3  The third stage of labor starts immediately after the birth of your baby and ends after you deliver the placenta. The placenta is an organ that develops  during pregnancy to provide oxygen and nutrients to your baby in the womb.  Delivering the placenta may require some pushing, and you may have mild contractions. Breastfeeding can stimulate contractions to help you deliver the placenta.  After the placenta is delivered, your uterus should tighten (contract) and become firm. This helps to stop bleeding in your uterus. To help your uterus contract and to control bleeding, your health care provider may: ? Give you medicine by injection, through an IV tube, by mouth, or through your rectum (rectally). ? Massage your abdomen or perform a vaginal exam to remove any blood clots that are left in your uterus. ? Empty your bladder by placing a thin, flexible tube (catheter) into your bladder. ? Encourage   you to breastfeed your baby. After labor is over, you and your baby will be monitored closely to ensure that you are both healthy until you are ready to go home. Your health care team will teach you how to care for yourself and your baby. This information is not intended to replace advice given to you by your health care provider. Make sure you discuss any questions you have with your health care provider. Document Released: 08/02/2008 Document Revised: 05/13/2016 Document Reviewed: 11/08/2015 Elsevier Interactive Patient Education  2018 [employer] Inc[employer]  

## 2017-11-29 NOTE — Progress Notes (Signed)
Subjective:  Victoria Wright is a 27 y.o. [phone] at 19w2dbeing seen today for ongoing prenatal care.  She is currently monitored for the following issues for this high-risk pregnancy and has Encounter for supervision of normal pregnancy, antepartum; History of macrosomia in infant in prior pregnancy, currently pregnant; Susceptible to Varicella (non-immune), currently pregnant in first trimester; Tobacco use affecting pregnancy, antepartum; Unwanted fertility; Amniotic fluid index borderline low; and Transient hypertension of pregnancy in third trimester on their problem list.  Patient reports general discomforts of pregnancy. No HA.  Contractions: Irregular. Vag. Bleeding: None.  Movement: Present. Denies leaking of fluid.   The following portions of the patient's history were reviewed and updated as appropriate: allergies, current medications, past family history, past medical history, past social history, past surgical history and problem list. Problem list updated.  Objective:   Vitals:   11/29/17 0951  BP: 136/76  Pulse: (!) 112  Weight: 243 lb 1.6 oz (110.3 kg)    Fetal Status: Fetal Heart Rate (bpm): 154   Movement: Present     General:  Alert, oriented and cooperative. Patient is in no acute distress.  Skin: Skin is warm and dry. No rash noted.   Cardiovascular: Normal heart rate noted  Respiratory: Normal respiratory effort, no problems with respiration noted  Abdomen: Soft, gravid, appropriate for gestational age. Pain/Pressure: Present     Pelvic:  Cervical exam performed        Extremities: Normal range of motion.  Edema: Trace  Mental Status: Normal mood and affect. Normal behavior. Normal judgment and thought content.   Urinalysis:      Assessment and Plan:  Pregnancy: [id number] 342w2d1. Encounter for supervision of normal pregnancy, antepartum, unspecified gravidity Labor precautions - Strep Gp B Culture+Rflx - Cervicovaginal ancillary only  2. Unwanted  fertility BTL papers signed  3. Transient hypertension of pregnancy in third trimester BP normal today - CBC - Comp Met (CMET) - Protein / creatinine ratio, urine  4. Susceptible to Varicella (non-immune), currently pregnant in first trimester Vaccine postpartum  5. Amniotic fluid index borderline low AFI 12.7 cm on Sat As per MFM recommendation will check with growth this week - [place] OB FOLLOW UP; Future  Term labor symptoms and general obstetric precautions including but not limited to vaginal bleeding, contractions, leaking of fluid and fetal movement were reviewed in detail with the patient. Please refer to After Visit Summary for other counseling recommendations.  Return in about 1 week (around 12/06/2017) for OB visit.   ErChancy MilroyMD

## 2017-11-29 NOTE — Progress Notes (Signed)
Patient reports good fetal movement, complains of a lot of pressure and irregular contractions. Pt complains of slight dizziness and seeing black spots.

## 2017-11-30 ENCOUNTER — Ambulatory Visit (HOSPITAL_COMMUNITY): Payer: Medicaid Other

## 2017-11-30 LAB — COMPREHENSIVE METABOLIC PANEL
ALT: 19 IU/L (ref 0–32)
AST: 19 IU/L (ref 0–40)
Albumin/Globulin Ratio: 1.4 (ref 1.2–2.2)
Albumin: 3.6 g/dL (ref 3.5–5.5)
Alkaline Phosphatase: 90 IU/L (ref 39–117)
BUN/Creatinine Ratio: 8 — ABNORMAL LOW (ref 9–23)
BUN: 4 mg/dL — ABNORMAL LOW (ref 6–20)
Bilirubin Total: <0.2 mg/dL (ref 0.0–1.2)
CO2: 19 mmol/L — ABNORMAL LOW (ref 20–29)
Calcium: 9.2 mg/dL (ref 8.7–10.2)
Chloride: 104 mmol/L (ref 96–106)
Creatinine, Ser: 0.49 mg/dL — ABNORMAL LOW (ref 0.57–1.00)
GFR calc Af Amer: 154 mL/min/{1.73_m2} (ref >59)
GFR calc non Af Amer: 134 mL/min/{1.73_m2} (ref >59)
Globulin, Total: 2.6 g/dL (ref 1.5–4.5)
Glucose: 143 mg/dL — ABNORMAL HIGH (ref 65–99)
Potassium: 3.5 mmol/L (ref 3.5–5.2)
Sodium: 140 mmol/L (ref 134–144)
Total Protein: 6.2 g/dL (ref 6.0–8.5)

## 2017-11-30 LAB — CERVICOVAGINAL ANCILLARY ONLY
Bacterial vaginitis: NEGATIVE
Candida vaginitis: NEGATIVE
Chlamydia: NEGATIVE
Neisseria Gonorrhea: NEGATIVE
Trichomonas: NEGATIVE

## 2017-11-30 LAB — CBC
Hematocrit: 34.5 % (ref 34.0–46.6)
Hemoglobin: 11.8 g/dL (ref 11.1–15.9)
MCH: 30.6 pg (ref 26.6–33.0)
MCHC: 34.2 g/dL (ref 31.5–35.7)
MCV: 90 fL (ref 79–97)
Platelets: 196 10*3/uL (ref 150–379)
RBC: 3.85 x10E6/uL (ref 3.77–5.28)
RDW: 14.2 % (ref 12.3–15.4)
WBC: 7.8 10*3/uL (ref 3.4–10.8)

## 2017-11-30 LAB — PROTEIN / CREATININE RATIO, URINE
Creatinine, Urine: 111.5 mg/dL
Protein, Ur: 31.7 mg/dL
Protein/Creat Ratio: 284 mg/g creat — ABNORMAL HIGH (ref 0–200)

## 2017-12-01 ENCOUNTER — Ambulatory Visit (HOSPITAL_COMMUNITY): Payer: Medicaid Other

## 2017-12-03 ENCOUNTER — Inpatient Hospital Stay (HOSPITAL_COMMUNITY)
Admission: AD | Admit: 2017-12-03 | Discharge: 2017-12-03 | Disposition: A | Discharge status: Home / Self Care | Payer: Medicaid Other | Source: Ambulatory Visit | Attending: Family Medicine | Admitting: Family Medicine

## 2017-12-03 ENCOUNTER — Other Ambulatory Visit: Payer: Self-pay

## 2017-12-03 ENCOUNTER — Encounter (HOSPITAL_COMMUNITY): Payer: Self-pay | Admitting: *Deleted

## 2017-12-03 DIAGNOSIS — Z3A36 36 weeks gestation of pregnancy: Secondary | ICD-10-CM | POA: Diagnosis not present

## 2017-12-03 DIAGNOSIS — O2343 Unspecified infection of urinary tract in pregnancy, third trimester: Secondary | ICD-10-CM | POA: Diagnosis not present

## 2017-12-03 DIAGNOSIS — O99333 Smoking (tobacco) complicating pregnancy, third trimester: Secondary | ICD-10-CM | POA: Insufficient documentation

## 2017-12-03 DIAGNOSIS — M545 Low back pain: Secondary | ICD-10-CM | POA: Diagnosis present

## 2017-12-03 DIAGNOSIS — Z0371 Encounter for suspected problem with amniotic cavity and membrane ruled out: Secondary | ICD-10-CM

## 2017-12-03 DIAGNOSIS — F17210 Nicotine dependence, cigarettes, uncomplicated: Secondary | ICD-10-CM | POA: Insufficient documentation

## 2017-12-03 LAB — URINALYSIS, ROUTINE W REFLEX MICROSCOPIC
Bilirubin Urine: NEGATIVE
Glucose, UA: NEGATIVE mg/dL
Ketones, ur: NEGATIVE mg/dL
Leukocytes, UA: NEGATIVE
Nitrite: NEGATIVE
Protein, ur: NEGATIVE mg/dL
Specific Gravity, Urine: 1.01 (ref 1.005–1.030)
pH: 7 (ref 5.0–8.0)

## 2017-12-03 LAB — POCT FERN TEST: POCT Fern Test: NEGATIVE

## 2017-12-03 MED ORDER — CEPHALEXIN 500 MG PO CAPS: 500.0000 mg | ORAL_CAPSULE | Freq: Four times a day (QID) | ORAL | 0 refills | Status: DC

## 2017-12-03 NOTE — Discharge Instructions (Signed)

## 2017-12-03 NOTE — MAU Provider Note (Signed)
History     CSN: [id number]  Arrival date and time: 12/03/17 1931   First Provider Initiated Contact with Patient 12/03/17 2148      Chief Complaint  Patient presents with  . Back Pain   HPI Victoria Wright is a 27 y.o. [id number] at [redacted]w[redacted]d who presents with low back pain. Symptoms began 4 days ago. Reports bilateral low back pain that is constant. Rates pain 7/10. Has not treated symptoms. Nothing makes pain better or worse. Endorses hematuria & urinary frequency. Denies dysuria, flank pain, n/v, or fever/chills. Denies vaginal bleeding, abdominal pain, or vaginal discharge. Positive fetal movement. Feels like she has had some intermittent leaking of fluid since earlier today.   OB History    Gravida Para Term Preterm AB Living   6 2 2   1 2    SAB TAB Ectopic Multiple Live Births   1       2      Past Medical History:  Diagnosis Date  . Abnormal Pap smear    follow up was wnl  . Acne   . Hypotension    taken out of work r/t bp drops  . UTI (lower urinary tract infection)   . Vaginal Pap smear, abnormal     Past Surgical History:  Procedure Laterality Date  . WISDOM TOOTH EXTRACTION      Family History  Problem Relation Age of Onset  . Hypertension Mother   . Diabetes Maternal Grandmother   . Cancer Maternal Grandfather   . Anesthesia problems Neg Hx   . Hypotension Neg Hx   . Pseudochol deficiency Neg Hx   . Malignant hyperthermia Neg Hx     Social History   Tobacco Use  . Smoking status: Current Some Day Smoker    Packs/day: 0.50    Types: Cigarettes  . Smokeless tobacco: Never Used  Substance Use Topics  . Alcohol use: No  . Drug use: No    Allergies:  Allergies  Allergen Reactions  . Amoxicillin Rash and Other (See Comments)    Has patient had a PCN reaction causing immediate rash, facial/tongue/throat swelling, SOB or lightheadedness with hypotension: No Has patient had a PCN reaction causing severe rash involving mucus membranes or skin  necrosis: No Has patient had a PCN reaction that required hospitalization No Has patient had a PCN reaction occurring within the last 10 years: No If all of the above answers are "NO", then may proceed with Cephalosporin use.  . Prednisone Rash    Medications Prior to Admission  Medication Sig Dispense Refill Last Dose  . Prenatal Multivit-Min-Fe-FA (PRENATAL VITAMINS) 0.8 MG tablet Take 1 tablet by mouth daily. 30 tablet 12 12/02/2017 at Unknown time  . albuterol (PROVENTIL HFA;VENTOLIN HFA) 108 (90 Base) MCG/ACT inhaler Inhale 2 puffs into the lungs every 6 (Wright) hours as needed for wheezing or shortness of breath. 1 Inhaler 1 More than a month at Unknown time  . Elastic Bandages & Supports (COMFORT FIT MATERNITY SUPP MED) MISC Wear daily when ambulating (Patient not taking: Reported on 11/29/2017) 1 each 0 Not Taking    Review of Systems  Constitutional: Negative.   Gastrointestinal: Negative.   Genitourinary: Positive for frequency, hematuria and vaginal discharge. Negative for dysuria, flank pain and vaginal bleeding.  Musculoskeletal: Positive for back pain.   Physical Exam   Blood pressure 124/67, pulse (!) 105, temperature 98.7 F (37.1 C), resp. rate 18, height 5\' 6"  (1.676 m), weight 245 lb (111.1 kg), last menstrual  period 03/20/2017, SpO2 96 %, unknown if currently breastfeeding.  Physical Exam  Nursing note and vitals reviewed. Constitutional: She is oriented to person, place, and time. She appears well-developed and well-nourished. No distress.  HENT:  Head: Normocephalic and atraumatic.  Eyes: Conjunctivae are normal. Right eye exhibits no discharge. Left eye exhibits no discharge. No scleral icterus.  Neck: Normal range of motion.  Respiratory: Effort normal. No respiratory distress.  GI: Soft. There is no CVA tenderness.  Genitourinary: No bleeding in the vagina. Vaginal discharge (small amount of thin white discharge; no pooling of fluid) found.  Genitourinary  Comments:  Dilation: 3 Effacement (%): 60 Cervical Position: Posterior Station: Ballotable Exam by:: Estanislado SpireE Cathi Hazan NP   Neurological: She is alert and oriented to person, place, and time.  Skin: Skin is warm and dry. She is not diaphoretic.  Psychiatric: She has a normal mood and affect. Her behavior is normal. Judgment and thought content normal.    MAU Course  Procedures Results for orders placed or performed during the hospital encounter of 12/03/17 (from the past 24 hour(s))  Urinalysis, Routine w reflex microscopic     Status: Abnormal   Collection Time: 12/03/17  7:38 PM  Result Value Ref Range   Color, Urine YELLOW YELLOW   APPearance HAZY (A) CLEAR   Specific Gravity, Urine 1.010 1.005 - 1.030   pH 7.0 5.0 - 8.0   Glucose, UA NEGATIVE NEGATIVE mg/dL   Hgb urine dipstick LARGE (A) NEGATIVE   Bilirubin Urine NEGATIVE NEGATIVE   Ketones, ur NEGATIVE NEGATIVE mg/dL   Protein, ur NEGATIVE NEGATIVE mg/dL   Nitrite NEGATIVE NEGATIVE   Leukocytes, UA NEGATIVE NEGATIVE   RBC / HPF TOO NUMEROUS TO COUNT 0 - 5 RBC/hpf   WBC, UA 6-30 0 - 5 WBC/hpf   Bacteria, UA RARE (A) NONE SEEN   Squamous Epithelial / LPF 0-5 (A) NONE SEEN   Mucus PRESENT   POCT fern test     Status: None   Collection Time: 12/03/17 10:14 PM  Result Value Ref Range   POCT Fern Test Negative = intact amniotic membranes     MDM NST:  Baseline: 135 bpm, Variability: Good {> 6 bpm), Accelerations: Reactive and Decelerations: Absent No pooling of fluid & fern negative. SVE unchanged from previous visit.  U/a with moderate leuks & hemoglobin, urine culture pending Afebrile, no CVAT, no flank pain -- no evidence of pyelo at this time  Assessment and Plan  A: 1. Urinary tract infection in mother during third trimester of pregnancy   2. [redacted] weeks gestation of pregnancy   3. Encounter for suspected PROM, with rupture of membranes not found    P: Discharge home Rx keflex Urine culture pending Discussed reasons  to return to MAU Keep f/u with OB  Judeth HornErin Lizvette Lightsey 12/03/2017, 9:48 PM

## 2017-12-03 NOTE — MAU Note (Signed)
Pt reports back pain for 4 days. Today pain is a little worse in her lower back. Pt describes the pain as achy and is located in her middle/lower back.Today pt reports some tiny clots  ("about the size of a sharpie dot") when she urinates. Pt believes the blood in coming from her urethra, not her vagina. Pt denies pain with urination, but had increased pressure. Denies fever or chills. Denies contractions. Denies recent intercourse. + FM. Pt having some LOF, thinks it is urine, but not sure.

## 2017-12-04 LAB — STREP GP B SUSCEPTIBILITY

## 2017-12-04 LAB — STREP GP B CULTURE+RFLX: Strep Gp B Culture+Rflx: POSITIVE — AB

## 2017-12-05 LAB — CULTURE, OB URINE: Culture: 40000 — AB

## 2017-12-06 ENCOUNTER — Ambulatory Visit (HOSPITAL_COMMUNITY)
Admission: RE | Admit: 2017-12-06 | Discharge: 2017-12-06 | Disposition: A | Discharge status: Home / Self Care | Payer: Medicaid Other | Source: Ambulatory Visit | Attending: Obstetrics and Gynecology | Admitting: Obstetrics and Gynecology

## 2017-12-06 ENCOUNTER — Ambulatory Visit (INDEPENDENT_AMBULATORY_CARE_PROVIDER_SITE_OTHER): Payer: Medicaid Other | Admitting: Obstetrics & Gynecology

## 2017-12-06 ENCOUNTER — Other Ambulatory Visit: Payer: Self-pay | Admitting: Obstetrics and Gynecology

## 2017-12-06 VITALS — BP 126/79 | HR 97 | Wt 244.7 lb

## 2017-12-06 DIAGNOSIS — O99333 Smoking (tobacco) complicating pregnancy, third trimester: Secondary | ICD-10-CM | POA: Diagnosis present

## 2017-12-06 DIAGNOSIS — Z3A37 37 weeks gestation of pregnancy: Secondary | ICD-10-CM | POA: Diagnosis not present

## 2017-12-06 DIAGNOSIS — Z3490 Encounter for supervision of normal pregnancy, unspecified, unspecified trimester: Secondary | ICD-10-CM

## 2017-12-06 DIAGNOSIS — Z362 Encounter for other antenatal screening follow-up: Secondary | ICD-10-CM

## 2017-12-06 DIAGNOSIS — O288 Other abnormal findings on antenatal screening of mother: Secondary | ICD-10-CM

## 2017-12-06 DIAGNOSIS — O09299 Supervision of pregnancy with other poor reproductive or obstetric history, unspecified trimester: Secondary | ICD-10-CM

## 2017-12-06 NOTE — Progress Notes (Signed)
US report reviewed   PRENATAL VISIT NOTE  Subjective:  Victoria Wright is a 27 y.o. [phone] at 3031w2d being seen today for ongoing prenatal care.  She is currently monitored for the following issues for this low-risk pregnancy and has Encounter for supervision of normal pregnancy, antepartum; History of macrosomia in infant in prior pregnancy, currently pregnant; Susceptible to Varicella (non-immune), currently pregnant in first trimester; Tobacco use affecting pregnancy, antepartum; Unwanted fertility; Amniotic fluid index borderline low; and Transient hypertension of pregnancy in third trimester on their problem list.  Patient reports occasional contractions.  Contractions: Irregular. Vag. Bleeding: None.  Movement: Present. Denies leaking of fluid.   The following portions of the patient's history were reviewed and updated as appropriate: allergies, current medications, past family history, past medical history, past social history, past surgical history and problem list. Problem list updated.  Objective:   Vitals:   12/06/17 0946  BP: 126/79  Pulse: 97  Weight: 244 lb 11.2 oz (111 kg)    Fetal Status: Fetal Heart Rate (bpm): 143   Movement: Present     General:  Alert, oriented and cooperative. Patient is in no acute distress.  Skin: Skin is warm and dry. No rash noted.   Cardiovascular: Normal heart rate noted  Respiratory: Normal respiratory effort, no problems with respiration noted  Abdomen: Soft, gravid, appropriate for gestational age.  Pain/Pressure: Present     Pelvic: Cervical exam deferred        Extremities: Normal range of motion.  Edema: None  Mental Status:  Normal mood and affect. Normal behavior. Normal judgment and thought content.   Assessment and Plan:  Pregnancy: [id number] at 7331w2d  1. Encounter for supervision of normal pregnancy, antepartum, unspecified gravidity Doing well, LGA baby  2. History of macrosomia in infant in prior pregnancy, currently  pregnant Rapid delivery  Term labor symptoms and general obstetric precautions including but not limited to vaginal bleeding, contractions, leaking of fluid and fetal movement were reviewed in detail with the patient. Please refer to After Visit Summary for other counseling recommendations.  Return in about 1 week (around 12/13/2017).   Scheryl DarterJames Arnold, MD

## 2017-12-06 NOTE — Patient Instructions (Signed)
Vaginal Delivery Vaginal delivery means that you will give birth by pushing your baby out of your birth canal (vagina). A team of health care providers will help you before, during, and after vaginal delivery. Birth experiences are unique for every woman and every pregnancy, and birth experiences vary depending on where you choose to give birth. What should I do to prepare for my baby's birth? Before your baby is born, it is important to talk with your health care provider about:  Your labor and delivery preferences. These may include: ? Medicines that you may be given. ? How you will manage your pain. This might include non-medical pain relief techniques or injectable pain relief such as epidural analgesia. ? How you and your baby will be monitored during labor and delivery. ? Who may be in the labor and delivery room with you. ? Your feelings about surgical delivery of your baby (cesarean delivery, or C-section) if this becomes necessary. ? Your feelings about receiving donated blood through an IV tube (blood transfusion) if this becomes necessary.  Whether you are able: ? To take pictures or videos of the birth. ? To eat during labor and delivery. ? To move around, walk, or change positions during labor and delivery.  What to expect after your baby is born, such as: ? Whether delayed umbilical cord clamping and cutting is offered. ? Who will care for your baby right after birth. ? Medicines or tests that may be recommended for your baby. ? Whether breastfeeding is supported in your hospital or birth center. ? How long you will be in the hospital or birth center.  How any medical conditions you have may affect your baby or your labor and delivery experience.  To prepare for your baby's birth, you should also:  Attend all of your health care visits before delivery (prenatal visits) as recommended by your health care provider. This is important.  Prepare your home for your baby's  arrival. Make sure that you have: ? Diapers. ? Baby clothing. ? Feeding equipment. ? Safe sleeping arrangements for you and your baby.  Install a car seat in your vehicle. Have your car seat checked by a certified car seat installer to make sure that it is installed safely.  Think about who will help you with your new baby at home for at least the first several weeks after delivery.  What can I expect when I arrive at the birth center or hospital? Once you are in labor and have been admitted into the hospital or birth center, your health care provider may:  Review your pregnancy history and any concerns you have.  Insert an IV tube into one of your veins. This is used to give you fluids and medicines.  Check your blood pressure, pulse, temperature, and heart rate (vital signs).  Check whether your bag of water (amniotic sac) has broken (ruptured).  Talk with you about your birth plan and discuss pain control options.  Monitoring Your health care provider may monitor your contractions (uterine monitoring) and your baby's heart rate (fetal monitoring). You may need to be monitored:  Often, but not continuously (intermittently).  All the time or for long periods at a time (continuously). Continuous monitoring may be needed if: ? You are taking certain medicines, such as medicine to relieve pain or make your contractions stronger. ? You have pregnancy or labor complications.  Monitoring may be done by:  Placing a special stethoscope or a handheld monitoring device on your abdomen to   check your baby's heartbeat, and feeling your abdomen for contractions. This method of monitoring does not continuously record your baby's heartbeat or your contractions.  Placing monitors on your abdomen (external monitors) to record your baby's heartbeat and the frequency and length of contractions. You may not have to wear external monitors all the time.  Placing monitors inside of your uterus  (internal monitors) to record your baby's heartbeat and the frequency, length, and strength of your contractions. ? Your health care provider may use internal monitors if he or she needs more information about the strength of your contractions or your baby's heart rate. ? Internal monitors are put in place by passing a thin, flexible wire through your vagina and into your uterus. Depending on the type of monitor, it may remain in your uterus or on your baby's head until birth. ? Your health care provider will discuss the benefits and risks of internal monitoring with you and will ask for your permission before inserting the monitors.  Telemetry. This is a type of continuous monitoring that can be done with external or internal monitors. Instead of having to stay in bed, you are able to move around during telemetry. Ask your health care provider if telemetry is an option for you.  Physical exam Your health care provider may perform a physical exam. This may include:  Checking whether your baby is positioned: ? With the head toward your vagina (head-down). This is most common. ? With the head toward the top of your uterus (head-up or breech). If your baby is in a breech position, your health care provider may try to turn your baby to a head-down position so you can deliver vaginally. If it does not seem that your baby can be born vaginally, your provider may recommend surgery to deliver your baby. In rare cases, you may be able to deliver vaginally if your baby is head-up (breech delivery). ? Lying sideways (transverse). Babies that are lying sideways cannot be delivered vaginally.  Checking your cervix to determine: ? Whether it is thinning out (effacing). ? Whether it is opening up (dilating). ? How low your baby has moved into your birth canal.  What are the three stages of labor and delivery?  Normal labor and delivery is divided into the following three stages: Stage 1  Stage 1 is the  longest stage of labor, and it can last for hours or days. Stage 1 includes: ? Early labor. This is when contractions may be irregular, or regular and mild. Generally, early labor contractions are more than 10 minutes apart. ? Active labor. This is when contractions get longer, more regular, more frequent, and more intense. ? The transition phase. This is when contractions happen very close together, are very intense, and may last longer than during any other part of labor.  Contractions generally feel mild, infrequent, and irregular at first. They get stronger, more frequent (about every 2-3 minutes), and more regular as you progress from early labor through active labor and transition.  Many women progress through stage 1 naturally, but you may need help to continue making progress. If this happens, your health care provider may talk with you about: ? Rupturing your amniotic sac if it has not ruptured yet. ? Giving you medicine to help make your contractions stronger and more frequent.  Stage 1 ends when your cervix is completely dilated to 4 inches (10 cm) and completely effaced. This happens at the end of the transition phase. Stage 2  Once   your cervix is completely effaced and dilated to 4 inches (10 cm), you may start to feel an urge to push. It is common for the body to naturally take a rest before feeling the urge to push, especially if you received an epidural or certain other pain medicines. This rest period may last for up to 1-2 hours, depending on your unique labor experience.  During stage 2, contractions are generally less painful, because pushing helps relieve contraction pain. Instead of contraction pain, you may feel stretching and burning pain, especially when the widest part of your baby's head passes through the vaginal opening (crowning).  Your health care provider will closely monitor your pushing progress and your baby's progress through the vagina during stage 2.  Your  health care provider may massage the area of skin between your vaginal opening and anus (perineum) or apply warm compresses to your perineum. This helps it stretch as the baby's head starts to crown, which can help prevent perineal tearing. ? In some cases, an incision may be made in your perineum (episiotomy) to allow the baby to pass through the vaginal opening. An episiotomy helps to make the opening of the vagina larger to allow more room for the baby to fit through.  It is very important to breathe and focus so your health care provider can control the delivery of your baby's head. Your health care provider may have you decrease the intensity of your pushing, to help prevent perineal tearing.  After delivery of your baby's head, the shoulders and the rest of the body generally deliver very quickly and without difficulty.  Once your baby is delivered, the umbilical cord may be cut right away, or this may be delayed for 1-2 minutes, depending on your baby's health. This may vary among health care providers, hospitals, and birth centers.  If you and your baby are healthy enough, your baby may be placed on your chest or abdomen to help maintain the baby's temperature and to help you bond with each other. Some mothers and babies start breastfeeding at this time. Your health care team will dry your baby and help keep your baby warm during this time.  Your baby may need immediate care if he or she: ? Showed signs of distress during labor. ? Has a medical condition. ? Was born too early (prematurely). ? Had a bowel movement before birth (meconium). ? Shows signs of difficulty transitioning from being inside the uterus to being outside of the uterus. If you are planning to breastfeed, your health care team will help you begin a feeding. Stage 3  The third stage of labor starts immediately after the birth of your baby and ends after you deliver the placenta. The placenta is an organ that develops  during pregnancy to provide oxygen and nutrients to your baby in the womb.  Delivering the placenta may require some pushing, and you may have mild contractions. Breastfeeding can stimulate contractions to help you deliver the placenta.  After the placenta is delivered, your uterus should tighten (contract) and become firm. This helps to stop bleeding in your uterus. To help your uterus contract and to control bleeding, your health care provider may: ? Give you medicine by injection, through an IV tube, by mouth, or through your rectum (rectally). ? Massage your abdomen or perform a vaginal exam to remove any blood clots that are left in your uterus. ? Empty your bladder by placing a thin, flexible tube (catheter) into your bladder. ? Encourage   you to breastfeed your baby. After labor is over, you and your baby will be monitored closely to ensure that you are both healthy until you are ready to go home. Your health care team will teach you how to care for yourself and your baby. This information is not intended to replace advice given to you by your health care provider. Make sure you discuss any questions you have with your health care provider. Document Released: 08/02/2008 Document Revised: 05/13/2016 Document Reviewed: 11/08/2015 Elsevier Interactive Patient Education  2018 [employer] Inc[employer]  

## 2017-12-10 ENCOUNTER — Inpatient Hospital Stay (HOSPITAL_COMMUNITY)
Admission: AD | Admit: 2017-12-10 | Discharge: 2017-12-13 | DRG: 807 | Disposition: A | Discharge status: Home / Self Care | Payer: Medicaid Other | Source: Ambulatory Visit | Attending: Obstetrics & Gynecology | Admitting: Obstetrics & Gynecology

## 2017-12-10 DIAGNOSIS — O99824 Streptococcus B carrier state complicating childbirth: Secondary | ICD-10-CM | POA: Diagnosis present

## 2017-12-10 DIAGNOSIS — Z880 Allergy status to penicillin: Secondary | ICD-10-CM | POA: Diagnosis not present

## 2017-12-10 DIAGNOSIS — O134 Gestational [pregnancy-induced] hypertension without significant proteinuria, complicating childbirth: Secondary | ICD-10-CM | POA: Diagnosis present

## 2017-12-10 DIAGNOSIS — Z3483 Encounter for supervision of other normal pregnancy, third trimester: Secondary | ICD-10-CM | POA: Diagnosis present

## 2017-12-10 DIAGNOSIS — Z3490 Encounter for supervision of normal pregnancy, unspecified, unspecified trimester: Secondary | ICD-10-CM

## 2017-12-10 DIAGNOSIS — O4202 Full-term premature rupture of membranes, onset of labor within 24 hours of rupture: Secondary | ICD-10-CM | POA: Diagnosis not present

## 2017-12-10 DIAGNOSIS — F17210 Nicotine dependence, cigarettes, uncomplicated: Secondary | ICD-10-CM | POA: Diagnosis present

## 2017-12-10 DIAGNOSIS — Z3A37 37 weeks gestation of pregnancy: Secondary | ICD-10-CM | POA: Diagnosis not present

## 2017-12-10 DIAGNOSIS — O99334 Smoking (tobacco) complicating childbirth: Secondary | ICD-10-CM | POA: Diagnosis present

## 2017-12-10 DIAGNOSIS — O3663X0 Maternal care for excessive fetal growth, third trimester, not applicable or unspecified: Secondary | ICD-10-CM | POA: Diagnosis present

## 2017-12-10 MED ORDER — OXYTOCIN 40 UNITS IN LACTATED RINGERS INFUSION - SIMPLE MED
2.5000 [IU]/h | INTRAVENOUS | Status: DC
  Administered 2017-12-11: 2.5 [IU]/h via INTRAVENOUS
  Filled 2017-12-10: qty 1000

## 2017-12-10 MED ORDER — FENTANYL CITRATE (PF) 100 MCG/2ML IJ SOLN: 100.0000 ug | INTRAMUSCULAR | Status: DC | PRN

## 2017-12-10 MED ORDER — ONDANSETRON HCL 4 MG/2ML IJ SOLN: 4.0000 mg | Freq: Four times a day (QID) | INTRAMUSCULAR | Status: DC | PRN

## 2017-12-10 MED ORDER — LIDOCAINE HCL (PF) 1 % IJ SOLN
30.0000 mL | INTRAMUSCULAR | Status: DC | PRN
  Filled 2017-12-10: qty 30

## 2017-12-10 MED ORDER — LACTATED RINGERS IV SOLN
INTRAVENOUS | Status: DC
  Administered 2017-12-11: via INTRAVENOUS

## 2017-12-10 MED ORDER — OXYTOCIN BOLUS FROM INFUSION
500.0000 mL | Freq: Once | INTRAVENOUS | Status: AC
  Administered 2017-12-11: 500 mL via INTRAVENOUS

## 2017-12-10 MED ORDER — ACETAMINOPHEN 325 MG PO TABS: 650.0000 mg | ORAL_TABLET | ORAL | Status: DC | PRN

## 2017-12-10 MED ORDER — SOD CITRATE-CITRIC ACID 500-334 MG/5ML PO SOLN: 30.0000 mL | ORAL | Status: DC | PRN

## 2017-12-10 MED ORDER — VANCOMYCIN HCL IN DEXTROSE 1-5 GM/200ML-% IV SOLN: 1000.0000 mg | Freq: Two times a day (BID) | INTRAVENOUS | Status: DC

## 2017-12-10 MED ORDER — LACTATED RINGERS IV SOLN: 500.0000 mL | INTRAVENOUS | Status: DC | PRN

## 2017-12-10 MED ORDER — OXYCODONE-ACETAMINOPHEN 5-325 MG PO TABS: 1.0000 | ORAL_TABLET | ORAL | Status: DC | PRN

## 2017-12-10 MED ORDER — OXYCODONE-ACETAMINOPHEN 5-325 MG PO TABS: 2.0000 | ORAL_TABLET | ORAL | Status: DC | PRN

## 2017-12-10 NOTE — H&P (Signed)
Obstetric History and Physical  Victoria MaizeShanae Demetria Poreicole Wright is a 27 y.o. [phone] with IUP at 1226w6d presenting for active labor with SROM about 1 hour ago. Pregnancy complicated by transient HTN with normal labs, GBS positive and allergic to penicillin, macrosomia in this pregnancy.  Prenatal Course Source of Care: [place] Pregnancy complications or risks: Patient Active Problem List   Diagnosis Date Noted  . Amniotic fluid index borderline low 11/28/2017  . Transient hypertension of pregnancy in third trimester 11/28/2017  . Unwanted fertility 10/11/2017  . History of macrosomia in infant in prior pregnancy, currently pregnant 06/22/2017  . Susceptible to Varicella (non-immune), currently pregnant in first trimester 06/22/2017  . Tobacco use affecting pregnancy, antepartum 06/22/2017  . Encounter for supervision of normal pregnancy, antepartum 06/07/2017   She plans to bottle feed She desires bilateral tubal ligation for postpartum contraception.   Prenatal labs and studies: ABO, Rh: O/Positive/-- (08/01 1333) Antibody: Negative (08/01 1333) Rubella: 6.04 (08/01 1333) RPR: Non Reactive (11/21 1125)  HBsAg: Negative (08/01 1333)  HIV: Non Reactive (11/21 1125)  GBS: positive 2 hr Glucola  77/139/115 Genetic screening normal Anatomy US abnormal with borderline low AFI and EFW greater than 90%  Prenatal Transfer Tool  Maternal Diabetes: No Genetic Screening: Normal Maternal Ultrasounds/Referrals: Abnormal:  Findings:   Other: borderline low AFI and macrosomia Fetal Ultrasounds or other Referrals:  None Maternal Substance Abuse:  No Significant Maternal Medications:  None Significant Maternal Lab Results: Lab values include: Group B Strep positive  Past Medical History:  Diagnosis Date  . Abnormal Pap smear    follow up was wnl  . Acne   . Hypotension    taken out of work r/t bp drops  . UTI (lower urinary tract infection)   . Vaginal Pap smear, abnormal     Past Surgical History:   Procedure Laterality Date  . WISDOM TOOTH EXTRACTION      OB History  Gravida Para Term Preterm AB Living  6 2 2   1 2   SAB TAB Ectopic Multiple Live Births  1       2    # Outcome Date GA Lbr Len/2nd Weight Sex Delivery Anes PTL Lv  6 Current           5 Term 12/22/12 3558w2d 09:12 / 00:19 9 lb 7.9 oz (4.305 kg) M Vag-Spont EPI  LIV  4 Term 05/27/07 7944w0d  8 lb 7 oz (3.827 kg) M Vag-Spont EPI  LIV     Complications: Postpartum hemorrhage  3 Gravida           2 Gravida           1 SAB               Social History   Socioeconomic History  . Marital status: Married    Spouse name: Not on file  . Number of children: 2  . Years of education: Not on file  . Highest education level: Not on file  Social Needs  . Financial resource strain: Not on file  . Food insecurity - worry: Not on file  . Food insecurity - inability: Not on file  . Transportation needs - medical: Not on file  . Transportation needs - non-medical: Not on file  Occupational History  . Occupation: CNA    Employer: [employer]  Tobacco Use  . Smoking status: Current Some Day Smoker    Packs/day: 0.50    Types: Cigarettes  . Smokeless tobacco: Never  Used  Substance and Sexual Activity  . Alcohol use: No  . Drug use: No  . Sexual activity: Yes    Birth control/protection: IUD  Other Topics Concern  . Not on file  Social History Narrative  . Not on file    Family History  Problem Relation Age of Onset  . Hypertension Mother   . Diabetes Maternal Grandmother   . Cancer Maternal Grandfather   . Anesthesia problems Neg Hx   . Hypotension Neg Hx   . Pseudochol deficiency Neg Hx   . Malignant hyperthermia Neg Hx     Medications Prior to Admission  Medication Sig Dispense Refill Last Dose  . albuterol (PROVENTIL HFA;VENTOLIN HFA) 108 (90 Base) MCG/ACT inhaler Inhale 2 puffs into the lungs every 6 (six) hours as needed for wheezing or shortness of breath. 1 Inhaler 1 Taking  . cephALEXin  (KEFLEX) 500 MG capsule Take 1 capsule (500 mg total) by mouth 4 (four) times daily. 28 capsule 0 Taking  . Elastic Bandages & Supports (COMFORT FIT MATERNITY SUPP MED) MISC Wear daily when ambulating (Patient not taking: Reported on 11/29/2017) 1 each 0 Not Taking  . Prenatal Multivit-Min-Fe-FA (PRENATAL VITAMINS) 0.8 MG tablet Take 1 tablet by mouth daily. 30 tablet 12 Taking    Allergies  Allergen Reactions  . Amoxicillin Rash and Other (See Comments)    Has patient had a PCN reaction causing immediate rash, facial/tongue/throat swelling, SOB or lightheadedness with hypotension: No Has patient had a PCN reaction causing severe rash involving mucus membranes or skin necrosis: No Has patient had a PCN reaction that required hospitalization No Has patient had a PCN reaction occurring within the last 10 years: No If all of the above answers are "NO", then may proceed with Cephalosporin use.  . Prednisone Rash    Review of Systems: Negative except for what is mentioned in HPI.  Physical Exam: LMP 03/20/2017 (Exact Date)  CONSTITUTIONAL: Well-developed, well-nourished female in no acute distress.  HENT:  Normocephalic, atraumatic, External right and left ear normal. Oropharynx is clear and moist EYES: Conjunctivae and EOM are normal. Pupils are equal, round, and reactive to light. No scleral icterus.  NECK: Normal range of motion, supple, no masses SKIN: Skin is warm and dry. No rash noted. Not diaphoretic. No erythema. No pallor. NEUROLOGIC: Alert and oriented to person, place, and time. Normal reflexes, muscle tone coordination. No cranial nerve deficit noted. PSYCHIATRIC: Normal mood and affect. Normal behavior. Normal judgment and thought content. CARDIOVASCULAR: Normal heart rate noted, regular rhythm RESPIRATORY: Effort and breath sounds normal, no problems with respiration noted ABDOMEN: Soft, nontender, nondistended, gravid. MUSCULOSKELETAL: Normal range of motion. No edema and no  tenderness. 2+ distal pulses.  Cervical Exam: Dilatation 7cm   Effacement 80%   Station -1   Presentation: cephalic FHT:  Baseline rate 130 bpm   Variability moderate  Accelerations present   Decelerations none    Pertinent Labs/Studies:   No results found for this or any previous visit (from the past 24 hour(s)).  Assessment : Emmakate Hypes is a 27 y.o. [phone] at [redacted]w[redacted]d being admitted for labor and SROM.  Plan: Labor: Expectant management. Analgesia as needed. FWB: Reassuring fetal heart tracing.  GBS positive, penicillin allergic and resistant to clindamycin. Will treat with vancomycin Delivery plan: Hopeful for vaginal delivery MOF: bottle MOC: BTL   Rolm Bookbinder, DO

## 2017-12-10 NOTE — MAU Note (Signed)
Presents to mau with contractions that started about 1.5 hrs ago.  + vb with wiping. + fm unsure if rom

## 2017-12-11 ENCOUNTER — Encounter (HOSPITAL_COMMUNITY): Payer: Self-pay

## 2017-12-11 ENCOUNTER — Encounter (HOSPITAL_COMMUNITY): Payer: Self-pay | Admitting: Anesthesiology

## 2017-12-11 ENCOUNTER — Encounter (HOSPITAL_COMMUNITY)
Admission: AD | Disposition: A | Discharge status: Home / Self Care | Payer: Self-pay | Source: Ambulatory Visit | Attending: Obstetrics & Gynecology

## 2017-12-11 LAB — TYPE AND SCREEN
ABO/RH(D): O POS
Antibody Screen: NEGATIVE

## 2017-12-11 LAB — CBC
HCT: 35.4 % — ABNORMAL LOW (ref 36.0–46.0)
Hemoglobin: 12.4 g/dL (ref 12.0–15.0)
MCH: 31.6 pg (ref 26.0–34.0)
MCHC: 35 g/dL (ref 30.0–36.0)
MCV: 90.3 fL (ref 78.0–100.0)
Platelets: 194 10*3/uL (ref 150–400)
RBC: 3.92 MIL/uL (ref 3.87–5.11)
RDW: 14 % (ref 11.5–15.5)
WBC: 8.3 10*3/uL (ref 4.0–10.5)

## 2017-12-11 LAB — RPR: RPR Ser Ql: NONREACTIVE

## 2017-12-11 SURGERY — LIGATION, FALLOPIAN TUBE, POSTPARTUM
Anesthesia: Choice | Laterality: Bilateral

## 2017-12-11 MED ORDER — PHENYLEPHRINE 40 MCG/ML (10ML) SYRINGE FOR IV PUSH (FOR BLOOD PRESSURE SUPPORT)
80.0000 ug | PREFILLED_SYRINGE | INTRAVENOUS | Status: DC | PRN
  Filled 2017-12-11: qty 5

## 2017-12-11 MED ORDER — ONDANSETRON HCL 4 MG PO TABS: 4.0000 mg | ORAL_TABLET | ORAL | Status: DC | PRN

## 2017-12-11 MED ORDER — ACETAMINOPHEN 325 MG PO TABS
650.0000 mg | ORAL_TABLET | ORAL | Status: DC | PRN
  Administered 2017-12-11 – 2017-12-12 (×2): 650 mg via ORAL
  Filled 2017-12-11 (×2): qty 2

## 2017-12-11 MED ORDER — EPHEDRINE 5 MG/ML INJ
10.0000 mg | INTRAVENOUS | Status: DC | PRN
  Filled 2017-12-11: qty 2

## 2017-12-11 MED ORDER — ONDANSETRON HCL 4 MG/2ML IJ SOLN: 4.0000 mg | INTRAMUSCULAR | Status: DC | PRN

## 2017-12-11 MED ORDER — TETANUS-DIPHTH-ACELL PERTUSSIS 5-2.5-18.5 LF-MCG/0.5 IM SUSP: 0.5000 mL | Freq: Once | INTRAMUSCULAR | Status: DC

## 2017-12-11 MED ORDER — DIPHENHYDRAMINE HCL 25 MG PO CAPS: 25.0000 mg | ORAL_CAPSULE | Freq: Four times a day (QID) | ORAL | Status: DC | PRN

## 2017-12-11 MED ORDER — WITCH HAZEL-GLYCERIN EX PADS: 1.0000 "application " | MEDICATED_PAD | CUTANEOUS | Status: DC | PRN

## 2017-12-11 MED ORDER — DIBUCAINE 1 % RE OINT: 1.0000 "application " | TOPICAL_OINTMENT | RECTAL | Status: DC | PRN

## 2017-12-11 MED ORDER — DIPHENHYDRAMINE HCL 50 MG/ML IJ SOLN: 12.5000 mg | INTRAMUSCULAR | Status: DC | PRN

## 2017-12-11 MED ORDER — IBUPROFEN 600 MG PO TABS
600.0000 mg | ORAL_TABLET | Freq: Four times a day (QID) | ORAL | Status: DC
  Administered 2017-12-11 – 2017-12-13 (×8): 600 mg via ORAL
  Filled 2017-12-11 (×9): qty 1

## 2017-12-11 MED ORDER — FENTANYL 2.5 MCG/ML BUPIVACAINE 1/10 % EPIDURAL INFUSION (WH - ANES): 14.0000 mL/h | INTRAMUSCULAR | Status: DC | PRN

## 2017-12-11 MED ORDER — COCONUT OIL OIL: 1.0000 "application " | TOPICAL_OIL | Status: DC | PRN

## 2017-12-11 MED ORDER — CEFAZOLIN SODIUM-DEXTROSE 2-4 GM/100ML-% IV SOLN: 2.0000 g | Freq: Once | INTRAVENOUS | Status: DC

## 2017-12-11 MED ORDER — SIMETHICONE 80 MG PO CHEW: 80.0000 mg | CHEWABLE_TABLET | ORAL | Status: DC | PRN

## 2017-12-11 MED ORDER — FENTANYL 2.5 MCG/ML BUPIVACAINE 1/10 % EPIDURAL INFUSION (WH - ANES)
INTRAMUSCULAR | Status: AC
  Filled 2017-12-11: qty 100

## 2017-12-11 MED ORDER — BENZOCAINE-MENTHOL 20-0.5 % EX AERO
1.0000 "application " | INHALATION_SPRAY | CUTANEOUS | Status: DC | PRN
  Administered 2017-12-11: 1 via TOPICAL
  Filled 2017-12-11: qty 56

## 2017-12-11 MED ORDER — ZOLPIDEM TARTRATE 5 MG PO TABS: 5.0000 mg | ORAL_TABLET | Freq: Every evening | ORAL | Status: DC | PRN

## 2017-12-11 MED ORDER — PRENATAL MULTIVITAMIN CH
1.0000 | ORAL_TABLET | Freq: Every day | ORAL | Status: DC
  Administered 2017-12-11 – 2017-12-12 (×2): 1 via ORAL
  Filled 2017-12-11 (×2): qty 1

## 2017-12-11 MED ORDER — SENNOSIDES-DOCUSATE SODIUM 8.6-50 MG PO TABS
2.0000 | ORAL_TABLET | ORAL | Status: DC
  Administered 2017-12-12 (×2): 2 via ORAL
  Filled 2017-12-11 (×2): qty 2

## 2017-12-11 MED ORDER — LACTATED RINGERS IV SOLN: 500.0000 mL | Freq: Once | INTRAVENOUS | Status: DC

## 2017-12-11 MED ORDER — PHENYLEPHRINE 40 MCG/ML (10ML) SYRINGE FOR IV PUSH (FOR BLOOD PRESSURE SUPPORT)
PREFILLED_SYRINGE | INTRAVENOUS | Status: AC
  Filled 2017-12-11: qty 10

## 2017-12-11 NOTE — Anesthesia Preprocedure Evaluation (Deleted)
Anesthesia Evaluation  Patient identified by MRN, date of birth, ID band Patient awake    Reviewed: Allergy & Precautions, H&P , NPO status , Patient's Chart, lab work & pertinent test results  Airway Mallampati: III  TM Distance: >3 FB Neck ROM: full    Dental no notable dental hx.    Pulmonary Current Smoker,    breath sounds clear to auscultation       Cardiovascular negative cardio ROS   Rhythm:Regular Rate:Normal     Neuro/Psych negative neurological ROS  negative psych ROS   GI/Hepatic negative GI ROS, Neg liver ROS,   Endo/Other  Morbid obesity  Renal/GU negative Renal ROS     Musculoskeletal   Abdominal (+) + obese,   Peds  Hematology negative hematology ROS (+)   Anesthesia Other Findings    Reproductive/Obstetrics <24hr postpartum                             Anesthesia Physical  Anesthesia Plan  ASA: III  Anesthesia Plan: Spinal   Post-op Pain Management:    Induction:   PONV Risk Score and Plan: Treatment may vary due to age or medical condition  Airway Management Planned: Natural Airway and Nasal Cannula  Additional Equipment: None  Intra-op Plan:   Post-operative Plan:   Informed Consent: I have reviewed the patients History and Physical, chart, labs and discussed the procedure including the risks, benefits and alternatives for the proposed anesthesia with the patient or authorized representative who has indicated his/her understanding and acceptance.   Dental advisory given  Plan Discussed with: CRNA  Anesthesia Plan Comments:         Anesthesia Quick Evaluation

## 2017-12-11 NOTE — Progress Notes (Signed)
Patient has changed her mind regarding tubal ligation and does not want it at this time. MD and surgery notified

## 2017-12-12 ENCOUNTER — Other Ambulatory Visit: Payer: Self-pay

## 2017-12-12 LAB — BIRTH TISSUE RECOVERY COLLECTION (PLACENTA DONATION)

## 2017-12-12 MED ORDER — IBUPROFEN 600 MG PO TABS: 600.0000 mg | ORAL_TABLET | Freq: Four times a day (QID) | ORAL | 0 refills | Status: DC

## 2017-12-12 NOTE — Progress Notes (Signed)
POSTPARTUM PROGRESS NOTE  Post Partum Day 1  Subjective:  Victoria Wright is a 27 y.o. [phone] s/p SVD at 6034w0d.  No acute events overnight.  Pt denies problems with ambulating, voiding or po intake.  She denies nausea or vomiting.  Pain is well controlled.  She has not had flatus. She has not had bowel movement.  Lochia Minimal.   Objective: Blood pressure 127/74, pulse 63, temperature 98.1 F (36.7 C), temperature source Oral, resp. rate 16, height 5\' 6"  (1.676 m), weight 111 kg (244 lb 11.2 oz), last menstrual period 03/20/2017, SpO2 97 %, unknown if currently breastfeeding.  Physical Exam:  General: alert, cooperative and no distress. Resting comfortably this morning Chest: no respiratory distress Heart:regular rate, distal pulses intact Abdomen: soft, nontender. Fundus firm at ~3cm below umbilicus Uterine Fundus: firm, appropriately tender DVT Evaluation: No calf swelling or tenderness Extremities: trace BL edema to ankles Skin: warm, dry;   Recent Labs    12/10/17 2346  HGB 12.4  HCT 35.4*    Assessment/Plan: Victoria Wright is a 27 y.o. [phone] s/p SVD at 1834w0d   PPD#1 - Doing well Contraception: undecided Feeding: breast Dispo: Plan for discharge 12/12/17 if she can get a ride. Otherwise will be 12/13/17   LOS: 2 days   Claudine Moutonyler Lillith Mcneff PA-S 12/12/2017, 7:44 AM

## 2017-12-12 NOTE — Discharge Summary (Signed)
OB Discharge Summary     Patient Name: Victoria Wright DOB: February 22, 1991 MRN: [id number]  Date of admission: 12/10/2017 Delivering MD: Rolm BookbinderMOSS, AMBER   Date of discharge: 12/12/2017  Admitting diagnosis: 38 WEEKS CTX BLEEDING Intrauterine pregnancy: 2278w0d     Secondary diagnosis:  Active Problems:   Pregnancy   SVD (spontaneous vaginal delivery)  Additional problems: Transient HTN, unwanted fertility(declined in-hospital BTL), tobacco use      Discharge diagnosis: Term Pregnancy Delivered                                                                                                Post partum procedures:none  Augmentation: none  Complications: None  Hospital course:  Onset of Labor With Vaginal Delivery     27 y.o. yo [id number] at 1778w0d was admitted in Active Labor on 12/10/2017. Patient had an uncomplicated labor course as follows:  Membrane Rupture Time/Date: 10:30 PM ,12/10/2017   Intrapartum Procedures: Episiotomy: None [1]                                         Lacerations:  1st degree [2]  Patient had a delivery of a Viable infant. 12/11/2017  Information for the patient's newborn:  Johnella MoloneyRudd, Girl Chandra [[id number]][[id number]]  Delivery Method: Vaginal, Spontaneous(Filed from Delivery Summary)    Pateint had an uncomplicated postpartum course.  She is ambulating, tolerating a regular diet, passing flatus, and urinating well. Patient is discharged home in stable condition on 12/12/17.   Physical exam  Vitals:   12/11/17 0652 12/11/17 0846 12/11/17 1825 12/12/17 0617  BP: 132/73 119/61 135/85 127/74  Pulse: 75 85 81 63  Resp: 16  18 16   Temp: 99 F (37.2 C) 98.4 F (36.9 C) 98.4 F (36.9 C) 98.1 F (36.7 C)  TempSrc: Oral Oral Oral Oral  SpO2:  99% 97%   Weight:      Height:       General: alert, cooperative and no distress Lochia: appropriate Uterine Fundus: firm Incision: N/A DVT Evaluation: No evidence of DVT seen on physical exam. No cords or calf tenderness. Labs: Lab  Results  Component Value Date   WBC 8.3 12/10/2017   HGB 12.4 12/10/2017   HCT 35.4 (L) 12/10/2017   MCV 90.3 12/10/2017   PLT 194 12/10/2017   CMP Latest Ref Rng & Units 11/29/2017  Glucose 65 - 99 mg/dL 696(E143(H)  BUN 6 - 20 mg/dL 4(L)  Creatinine 9.520.57 - 1.00 mg/dL 8.41(L0.49(L)  Sodium 244134 - 010144 mmol/L 140  Potassium 3.5 - 5.2 mmol/L 3.5  Chloride 96 - 106 mmol/L 104  CO2 20 - 29 mmol/L 19(L)  Calcium 8.7 - 10.2 mg/dL 9.2  Total Protein 6.0 - 8.5 g/dL 6.2  Total Bilirubin 0.0 - 1.2 mg/dL <2.7<0.2  Alkaline Phos 39 - 117 IU/L 90  AST 0 - 40 IU/L 19  ALT 0 - 32 IU/L 19    Discharge instruction: per After Visit Summary and "Baby and Me Booklet".  After visit  meds:  Allergies as of 12/12/2017      Reactions   Amoxicillin Rash, Other (See Comments)   Has patient had a PCN reaction causing immediate rash, facial/tongue/throat swelling, SOB or lightheadedness with hypotension: No Has patient had a PCN reaction causing severe rash involving mucus membranes or skin necrosis: No Has patient had a PCN reaction that required hospitalization No Has patient had a PCN reaction occurring within the last 10 years: No If all of the above answers are "NO", then may proceed with Cephalosporin use.   Prednisone Rash      Medication List    STOP taking these medications   cephALEXin 500 MG capsule Commonly known as:  KEFLEX   COMFORT FIT MATERNITY SUPP MED Misc     TAKE these medications   albuterol 108 (90 Base) MCG/ACT inhaler Commonly known as:  PROVENTIL HFA;VENTOLIN HFA Inhale 2 puffs into the lungs every 6 (six) hours as needed for wheezing or shortness of breath.   ibuprofen 600 MG tablet Commonly known as:  ADVIL,MOTRIN Take 1 tablet (600 mg total) by mouth every 6 (six) hours.   Prenatal Vitamins 0.8 MG tablet Take 1 tablet by mouth daily.       Diet: routine diet  Activity: Advance as tolerated. Pelvic rest for 6 weeks.   Outpatient follow up:4 weeks Follow up Appt: Future  Appointments  Date Time Provider Department Center  01/09/2018  9:30 AM Adam Phenix, MD [facility] None   Follow up Visit:No Follow-up on file.  Postpartum contraception: Tubal Ligation (interval)  Newborn Data: Live born female  Birth Weight: 8 lb 5.5 oz (3785 g) APGAR: 9, 9  Newborn Delivery   Birth date/time:  12/11/2017 00:16:00 Delivery type:  Vaginal, Spontaneous     Baby Feeding: Bottle Disposition:home with mother   12/12/2017 Caryl Ada, DO

## 2017-12-12 NOTE — Discharge Instructions (Signed)

## 2017-12-13 ENCOUNTER — Encounter: Payer: Medicaid Other | Admitting: Obstetrics & Gynecology

## 2017-12-13 NOTE — Discharge Summary (Signed)
OB Discharge Summary     Patient Name: Victoria Wright DOB: 1990-12-05 MRN: [id number]  Date of admission: 12/10/2017 Delivering MD: Rolm Bookbinder   Date of discharge: 12/13/2017  Admitting diagnosis: 38 WEEKS CTX BLEEDING Intrauterine pregnancy: [redacted]w[redacted]d     Secondary diagnosis:  Active Problems:   Pregnancy   SVD (spontaneous vaginal delivery)  Additional problems:Transient HTN, unwanted fertility(declined in-hospital BTL), tobacco use       Discharge diagnosis: Term Pregnancy Delivered                                                                                                Post partum procedures:none  Augmentation: AROM  Complications: None  Hospital course:  Onset of Labor With Vaginal Delivery     27 y.o. yo [id number] at [redacted]w[redacted]d was admitted in Active Labor on 12/10/2017. Patient had an uncomplicated labor course as follows:  Membrane Rupture Time/Date: 10:30 PM ,12/10/2017   Intrapartum Procedures: Episiotomy: None [1]                                         Lacerations:  1st degree [2]  Patient had a delivery of a Viable infant. 12/11/2017  Information for the patient's newborn:  Bettyann, Birchler [[id number]]  Delivery Method: Vaginal, Spontaneous(Filed from Delivery Summary)    Pateint had an uncomplicated postpartum course.  She is ambulating, tolerating a regular diet, passing flatus, and urinating well. Patient is discharged home in stable condition on 12/13/17.   Physical exam  Vitals:   12/11/17 0846 12/11/17 1825 12/12/17 0617 12/12/17 1817  BP: 119/61 135/85 127/74 129/79  Pulse: 85 81 63 88  Resp:  18 16 16   Temp: 98.4 F (36.9 C) 98.4 F (36.9 C) 98.1 F (36.7 C) 99.5 F (37.5 C)  TempSrc: Oral Oral Oral Oral  SpO2: 99% 97%    Weight:      Height:       General: alert, cooperative and no distress Lochia: appropriate Uterine Fundus: firm Incision: N/A DVT Evaluation: No evidence of DVT seen on physical exam. Labs: Lab Results  Component Value  Date   WBC 8.3 12/10/2017   HGB 12.4 12/10/2017   HCT 35.4 (L) 12/10/2017   MCV 90.3 12/10/2017   PLT 194 12/10/2017   CMP Latest Ref Rng & Units 11/29/2017  Glucose 65 - 99 mg/dL 130(Q)  BUN 6 - 20 mg/dL 4(L)  Creatinine 6.57 - 1.00 mg/dL 8.46(N)  Sodium 629 - 528 mmol/L 140  Potassium 3.5 - 5.2 mmol/L 3.5  Chloride 96 - 106 mmol/L 104  CO2 20 - 29 mmol/L 19(L)  Calcium 8.7 - 10.2 mg/dL 9.2  Total Protein 6.0 - 8.5 g/dL 6.2  Total Bilirubin 0.0 - 1.2 mg/dL <4.1  Alkaline Phos 39 - 117 IU/L 90  AST 0 - 40 IU/L 19  ALT 0 - 32 IU/L 19    Discharge instruction: per After Visit Summary and "Baby and Me Booklet".  After visit meds:  Allergies as of  12/13/2017      Reactions   Amoxicillin Rash, Other (See Comments)   Has patient had a PCN reaction causing immediate rash, facial/tongue/throat swelling, SOB or lightheadedness with hypotension: No Has patient had a PCN reaction causing severe rash involving mucus membranes or skin necrosis: No Has patient had a PCN reaction that required hospitalization No Has patient had a PCN reaction occurring within the last 10 years: No If all of the above answers are "NO", then may proceed with Cephalosporin use.   Prednisone Rash      Medication List    STOP taking these medications   cephALEXin 500 MG capsule Commonly known as:  KEFLEX   COMFORT FIT MATERNITY SUPP MED Misc     TAKE these medications   albuterol 108 (90 Base) MCG/ACT inhaler Commonly known as:  PROVENTIL HFA;VENTOLIN HFA Inhale 2 puffs into the lungs every 6 (six) hours as needed for wheezing or shortness of breath.   ibuprofen 600 MG tablet Commonly known as:  ADVIL,MOTRIN Take 1 tablet (600 mg total) by mouth every 6 (six) hours.   Prenatal Vitamins 0.8 MG tablet Take 1 tablet by mouth daily.       Diet: routine diet  Activity: Advance as tolerated. Pelvic rest for 6 weeks.   Outpatient follow up: 1 week BP check Follow up Appt: Future Appointments   Date Time Provider Department Center  12/26/2017 11:00 AM Adam PhenixArnold, James G, MD [facility] None  01/09/2018  9:30 AM Adam PhenixArnold, James G, MD [facility] None   Follow up Visit:No Follow-up on file.  Postpartum contraception: Undecided, changed her mind about BTL  Newborn Data: Live born female  Birth Weight: 8 lb 5.5 oz (3785 g) APGAR: 9, 9  Newborn Delivery   Birth date/time:  12/11/2017 00:16:00 Delivery type:  Vaginal, Spontaneous     Baby Feeding: Bottle Disposition:home with mother   12/13/2017 Wynelle BourgeoisMarie Dorella Laster, CNM

## 2017-12-26 ENCOUNTER — Encounter: Payer: Self-pay | Admitting: Obstetrics & Gynecology

## 2017-12-26 ENCOUNTER — Ambulatory Visit: Payer: Medicaid Other | Admitting: Obstetrics & Gynecology

## 2018-01-09 ENCOUNTER — Encounter: Payer: Self-pay | Admitting: Obstetrics & Gynecology

## 2018-01-09 ENCOUNTER — Ambulatory Visit (INDEPENDENT_AMBULATORY_CARE_PROVIDER_SITE_OTHER): Payer: Medicaid Other | Admitting: Obstetrics & Gynecology

## 2018-01-09 DIAGNOSIS — Z1389 Encounter for screening for other disorder: Secondary | ICD-10-CM | POA: Diagnosis not present

## 2018-01-09 DIAGNOSIS — F17200 Nicotine dependence, unspecified, uncomplicated: Secondary | ICD-10-CM | POA: Insufficient documentation

## 2018-01-09 DIAGNOSIS — Z3049 Encounter for surveillance of other contraceptives: Secondary | ICD-10-CM

## 2018-01-09 MED ORDER — ETONOGESTREL-ETHINYL ESTRADIOL 0.12-0.015 MG/24HR VA RING: VAGINAL_RING | VAGINAL | 12 refills | Status: DC

## 2018-01-09 NOTE — Progress Notes (Signed)
Post Partum Exam  Eustaquio MaizeShanae Demetria Poreicole Rudd is a 27 y.o. [phone] [id number] female who presents for a postpartum visit. She is 4 weeks postpartum following a spontaneous vaginal delivery. I have fully reviewed the prenatal and intrapartum course. The delivery was at 38 gestational weeks.  Anesthesia: none. Postpartum course has been good. Baby's course has been good. Baby is feeding by bottle - Lucien MonsGerber Good Start Gentle. Bleeding no bleeding. Bowel function is Constipated. Bladder function is normal. Patient is sexually active. Contraception method is none. Postpartum depression screening:neg  The following portions of the patient's history were reviewed and updated as appropriate: allergies, current medications, past family history, past medical history, past social history, past surgical history and problem list. Last pap smear done 2017 and was Normal  Review of Systems Pertinent items are noted in HPI.    Objective:  unknown if currently breastfeeding. Blood pressure (!) 144/82, pulse 69, weight 225 lb (102.1 kg), not currently breastfeeding.  General:  alert, cooperative and no distress   Breasts:     Lungs: effort normal  Heart:     Abdomen: soft, non-tender; bowel sounds normal; no masses,  no organomegaly   Vulva:  not evaluated  Vagina: not evaluated                    Assessment:    normal postpartum exam. Pap smear not done at today's visit.   Plan:   1. Contraception: NuvaRing vaginal inserts 2. Smoking cessation discussed 5 minutes 3. Follow up in: 4 weeks or as needed.  BP check  Adam PhenixArnold, James G, MD

## 2018-01-09 NOTE — Patient Instructions (Signed)
Health Risks of Smoking  Smoking cigarettes is very bad for your health. Tobacco smoke has over 200 known poisons in it. It contains the poisonous gases nitrogen oxide and carbon monoxide. There are over 60 chemicals in tobacco smoke that cause cancer.  Smoking is difficult to quit because a chemical in tobacco, called nicotine, causes addiction or dependence. When you smoke and inhale, nicotine is absorbed rapidly into the bloodstream through your lungs. Both inhaled and non-inhaled nicotine may be addictive.  What are the risks of cigarette smoke?  Cigarette smokers have an increased risk of many serious medical problems, including:  · Lung cancer.  · Lung disease, such as pneumonia, bronchitis, and emphysema.  · Chest pain (angina) and heart attack because the heart is not getting enough oxygen.  · Heart disease and peripheral blood vessel disease.  · High blood pressure (hypertension).  · Stroke.  · Oral cancer, including cancer of the lip, mouth, or voice box.  · Bladder cancer.  · Pancreatic cancer.  · Cervical cancer.  · Pregnancy complications, including premature birth.  · Stillbirths and smaller newborn babies, birth defects, and genetic damage to sperm.  · Early menopause.  · Lower estrogen level for women.  · Infertility.  · Facial wrinkles.  · Blindness.  · Increased risk of broken bones (fractures).  · Senile dementia.  · Stomach ulcers and internal bleeding.  · Delayed wound healing and increased risk of complications during surgery.  · Even smoking lightly shortens your life expectancy by several years.    Because of secondhand smoke exposure, children of smokers have an increased risk of the following:  · Sudden infant death syndrome (SIDS).  · Respiratory infections.  · Lung cancer.  · Heart disease.  · Ear infections.    What are the benefits of quitting?  There are many health benefits of quitting smoking. Here are some of them:   · Within days of quitting smoking, your risk of having a heart attack decreases, your blood flow improves, and your lung capacity improves. Blood pressure, pulse rate, and breathing patterns start returning to normal soon after quitting.  · Within months, your lungs may clear up completely.  · Quitting for 10 years reduces your risk of developing lung cancer and heart disease to almost that of a nonsmoker.  · People who quit may see an improvement in their overall quality of life.    How do I quit smoking?  Smoking is an addiction with both physical and psychological effects, and longtime habits can be hard to change. Your health care provider can recommend:  · Programs and community resources, which may include group support, education, or talk therapy.  · Prescription medicines to help reduce cravings.  · Nicotine replacement products, such as patches, gum, and nasal sprays. Use these products only as directed. Do not replace cigarette smoking with electronic cigarettes, which are commonly called e-cigarettes. The safety of e-cigarettes is not known, and some may contain harmful chemicals.  · A combination of two or more of these methods.    Where to find more information:  · American Lung Association: www.lung.org  · American Cancer Society: www.cancer.org  Summary  · Smoking cigarettes is very bad for your health. Cigarette smokers have an increased risk of many serious medical problems, including several cancers, heart disease, and stroke.  · Smoking is an addiction with both physical and psychological effects, and longtime habits can be hard to change.  · By stopping right away, you   can greatly reduce the risk of medical problems for you and your family.  · To help you quit smoking, your health care provider can recommend programs, community resources, prescription medicines, and nicotine replacement products such as patches, gum, and nasal sprays.   This information is not intended to replace advice given to you by your health care provider. Make sure you discuss any questions you have with your health care provider.  Document Released: 12/01/2004 Document Revised: 10/28/2016 Document Reviewed: 10/28/2016  Elsevier Interactive Patient Education © 2017 Elsevier Inc.

## 2018-09-21 IMAGING — US US MFM FETAL BPP W/O NON-STRESS
1 series · 12 of 27 positions shown · non-contrast
Comparison: none

[Series 1: us mfm fetal bpp w/o non-stress · 27 acquisitions, 12 frames shown]
[im 2/27]
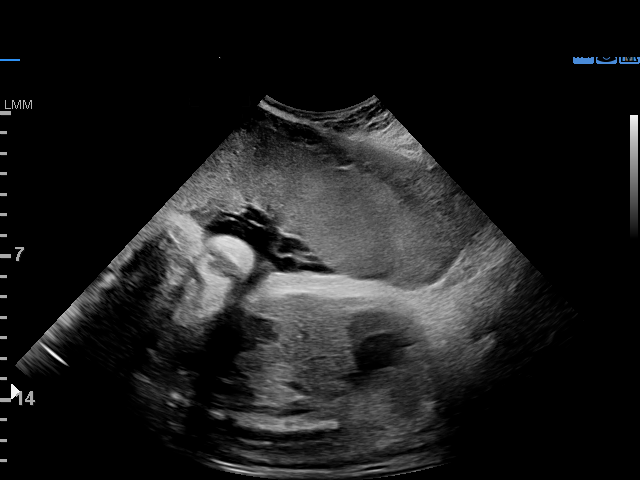
[im 4/27]
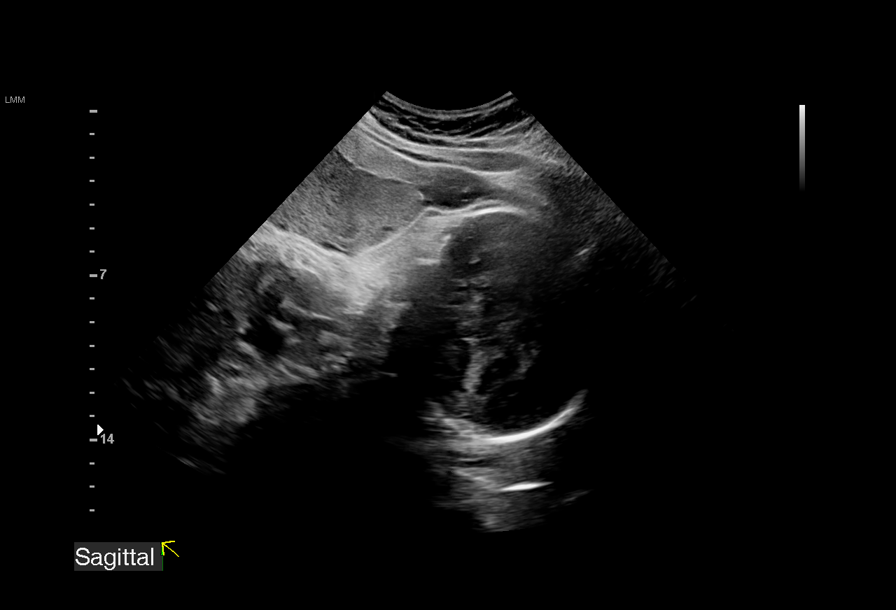
[im 6/27]
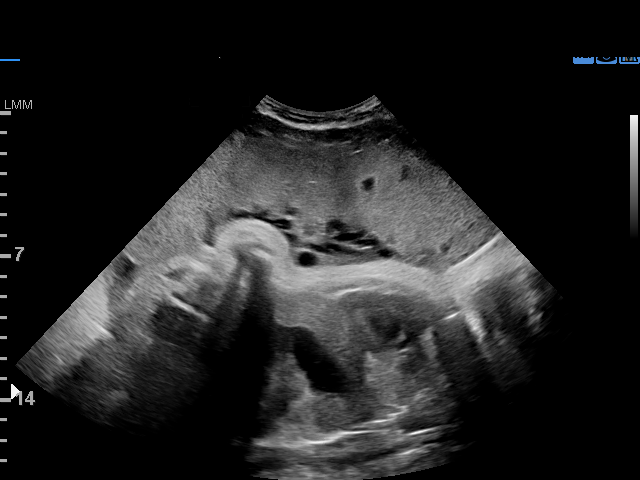
[im 8/27]
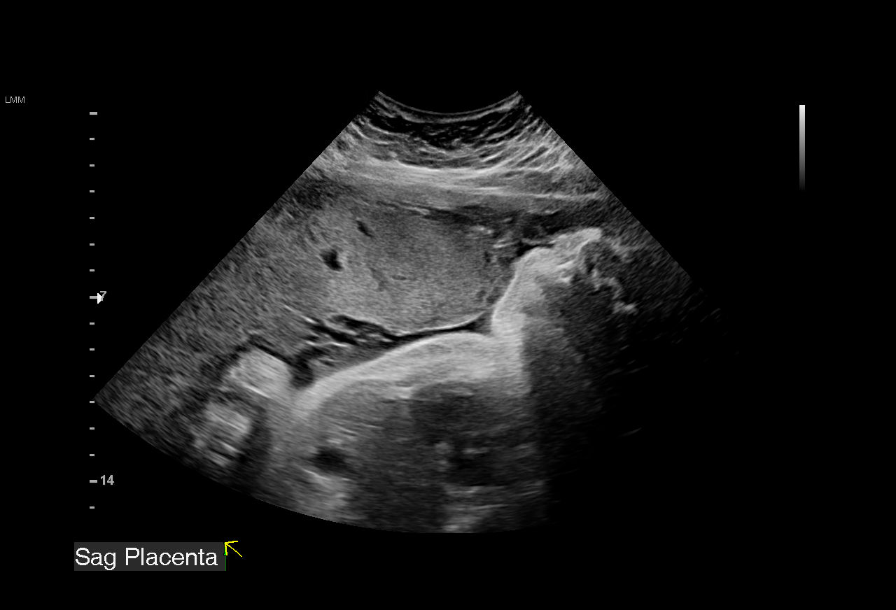
[im 11/27]
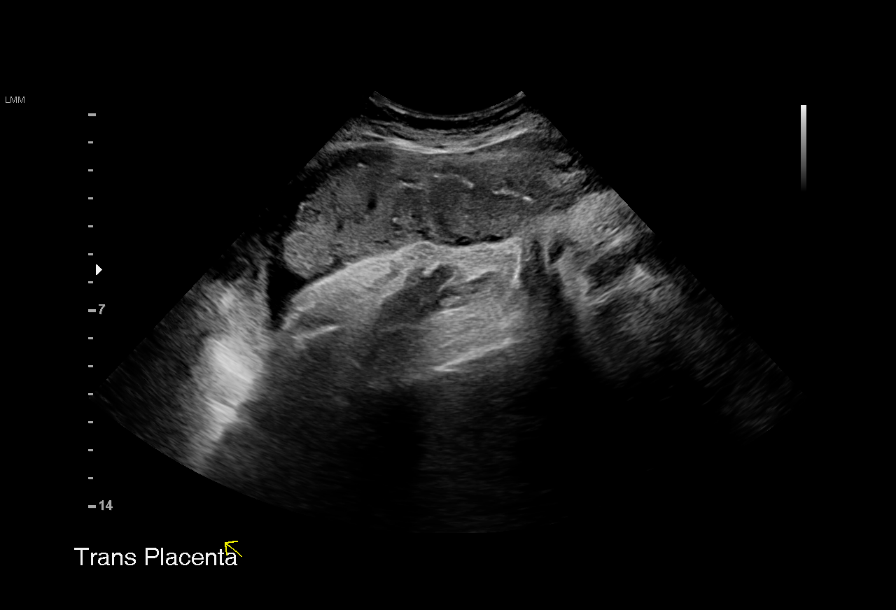
[im 13/27]
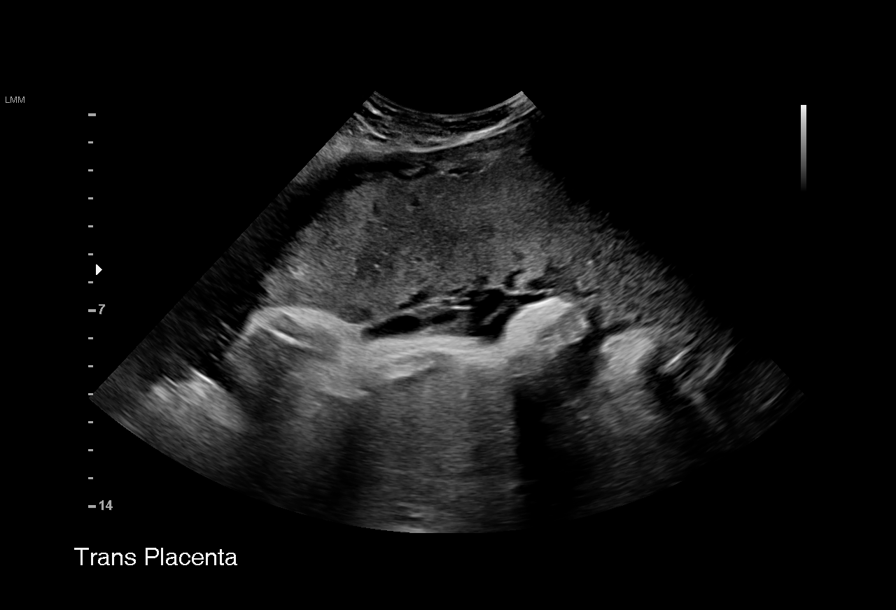
[im 15/27]
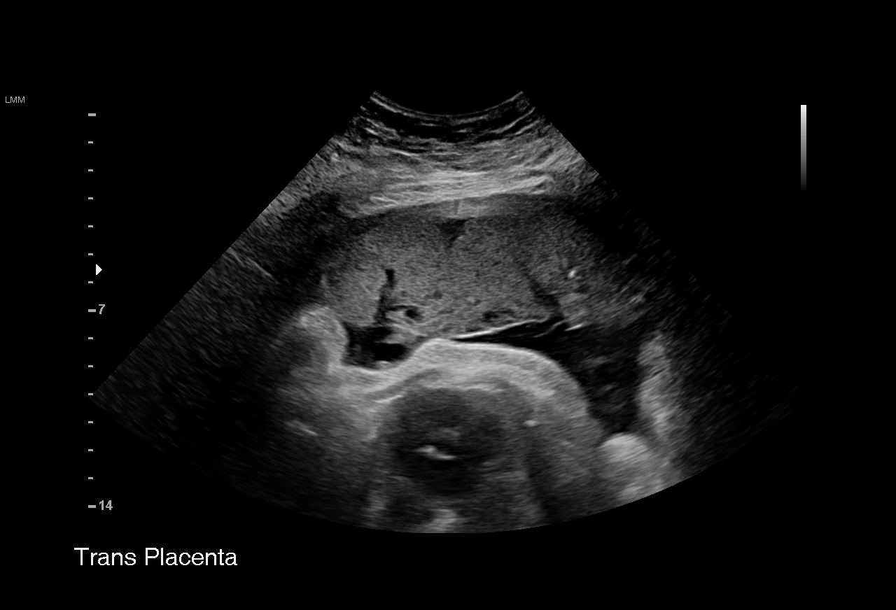
[im 17/27]
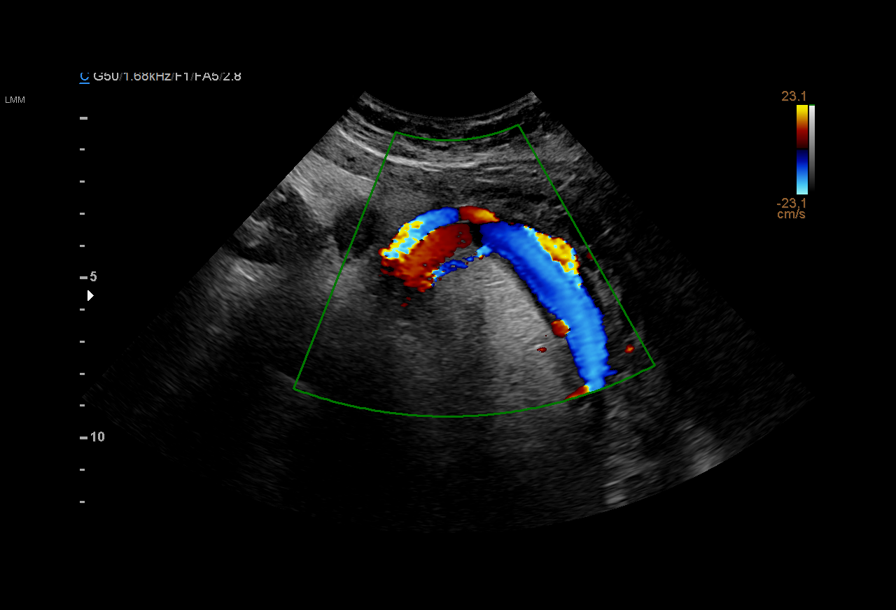
[im 20/27]
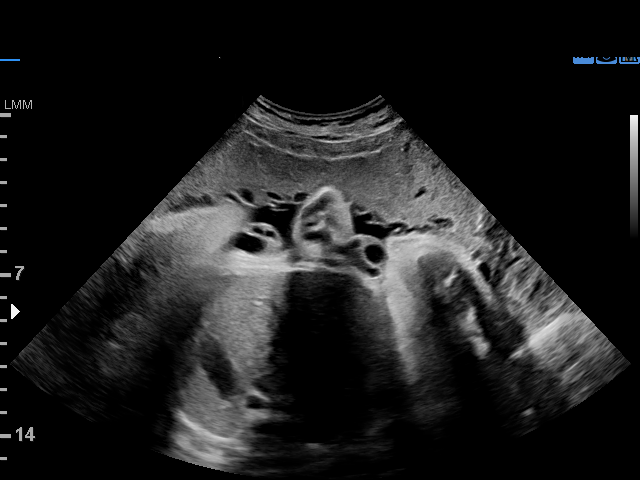
[im 22/27]
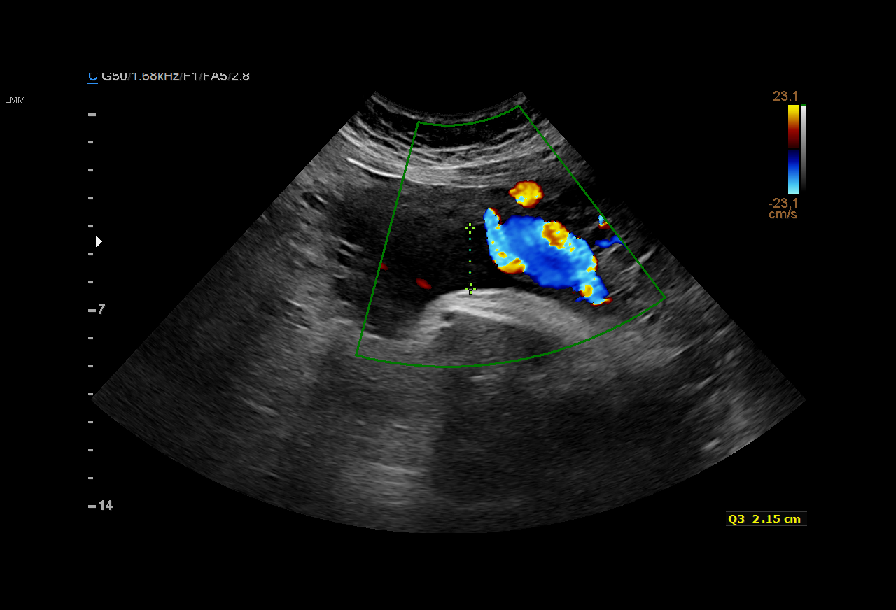
[im 24/27]
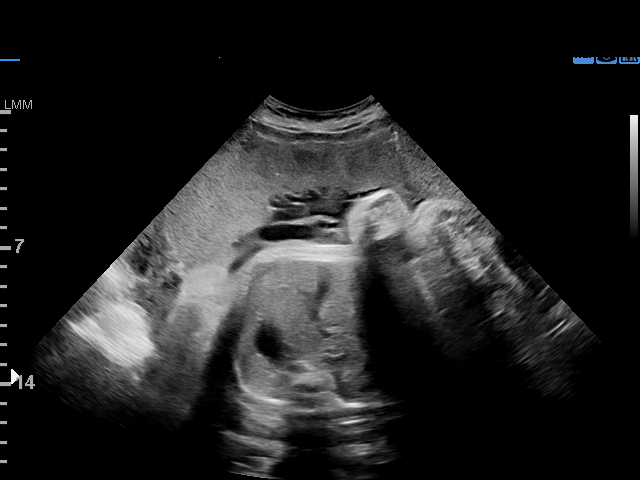
[im 26/27]
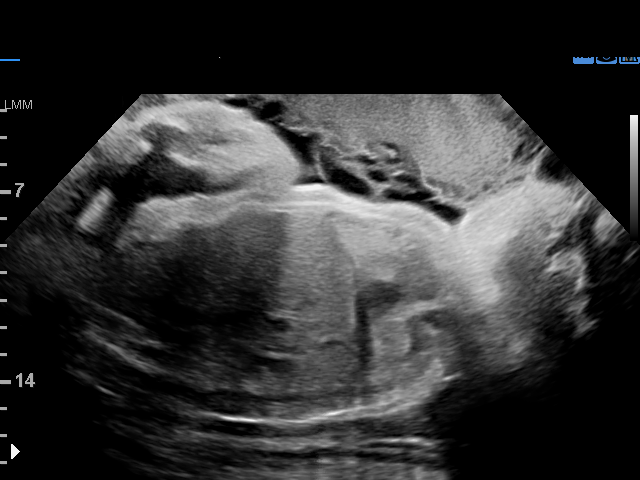

[12 of 27 positions shown; findings below may reference images not displayed]

Attending:         Savio Locklear         Secondary Phy.:    [facility]-
MAU/Triage

1  CVETLICARNA PANDURI            [id number]      [id number]     [id number]
Indications

35 weeks gestation of pregnancy
Fetal heart rate decelerations affecting
management of mother (reactive NST)
OB History

Gravidity:     5         Term:  2          Prem:  0        SAB:   1
TOP:           0       Ectopic: 0         Living: 2
Fetal Evaluation

Num Of Fetuses:      1
Fetal Heart          153
Rate(bpm):
Cardiac Activity:    Observed
Presentation:        Cephalic
Placenta:            Anterior, above cervical os

Amniotic Fluid
AFI FV:      Oligohydramnios

AFI Sum(cm)     %Tile       Largest Pocket(cm)
12.66           41

RUQ(cm)       RLQ(cm)        LUQ(cm)        LLQ(cm)
4.29

Comment:     Subjectively oligohydramnios. Measured pockets are the only fluid
present and difficult to find.
Biophysical Evaluation

Amniotic F.V:   Pocket => 2 cm two          F. Tone:         Observed
planes
F. Movement:    Observed                    Score:           [DATE]
F. Breathing:   Observed
Gestational Age

LMP:            35w 6d       Date:  03/20/17                   EDD:  12/25/17
Best:           35w 6d    Det. By:  LMP  (03/20/17)            EDD:  12/25/17
Impression

Single living intrauterine pregnancy at 35w 6d.
Cephalic presentation.
Amniotic fluid volume measures within normal limits but overall
appearance consistent with oligohydramnios.
BPP [DATE].

Recommendations

Continue clinical evaluation and management.
Recommend follow-up ultrasound for growth and fluid
assessment this week.

## 2018-10-09 ENCOUNTER — Encounter (HOSPITAL_COMMUNITY): Payer: Self-pay | Admitting: Emergency Medicine

## 2018-10-09 ENCOUNTER — Other Ambulatory Visit: Payer: Self-pay

## 2018-10-09 ENCOUNTER — Ambulatory Visit (HOSPITAL_COMMUNITY)
Admission: EM | Admit: 2018-10-09 | Discharge: 2018-10-09 | Disposition: A | Discharge status: Home / Self Care | Payer: Self-pay | Attending: Family Medicine | Admitting: Family Medicine

## 2018-10-09 DIAGNOSIS — L03011 Cellulitis of right finger: Secondary | ICD-10-CM

## 2018-10-09 MED ORDER — SULFAMETHOXAZOLE-TRIMETHOPRIM 800-160 MG PO TABS: 1.0000 | ORAL_TABLET | Freq: Two times a day (BID) | ORAL | 0 refills | Status: AC

## 2018-10-09 NOTE — ED Provider Notes (Signed)
[facility]   [id number] 10/09/18 Arrival Time: 1736  ASSESSMENT & PLAN:  1. Paronychia of right ring finger    Declines attempt at I&D. Prefers trial of antibiotics first.  Meds ordered this encounter  Medications  . sulfamethoxazole-trimethoprim (BACTRIM DS,SEPTRA DS) 800-160 MG tablet    Sig: Take 1 tablet by mouth 2 (two) times daily for 10 days.    Dispense:  20 tablet    Refill:  0   OTC analgesics as needed.  Follow-up Information    [facility].   Specialty:  Urgent Care Why:  If not seeing improvment over the next several days. Contact information: [address] [place] [address] [phone]         Reviewed expectations re: course of current medical issues. Questions answered. Outlined signs and symptoms indicating need for more acute intervention. Patient verbalized understanding. After Visit Summary given.   SUBJECTIVE:  Victoria Wright is a 27 y.o. female who presents with a possible infection of her R 4th finger around fingernail. Onset gradual, approximately 2 weeks ago. "Poked it a few days ago" with reported purulent drainage. Continuing erythema and soreness. Afebrile. Minimal discomfort. No bleeding. No extremity sensation changes or weakness. No OTC medications tried.  ROS: As per HPI.  OBJECTIVE:  Vitals:   10/09/18 1829  BP: 120/77  Pulse: 85  Resp: 20  Temp: 99.2 F (37.3 C)  TempSrc: Oral  SpO2: 100%     General appearance: alert; no distress Skin: erythema and warmth around nailfold of her R 4th finger; tender to touch; no active drainage; brisk capillary refill; FROM of R 4th finger; normal distal sensation; no bleeding Psychological: alert and cooperative; normal mood and affect  Allergies  Allergen Reactions  . Amoxicillin Rash and Other (See Comments)    Has patient had a PCN reaction causing immediate rash, facial/tongue/throat swelling, SOB or  lightheadedness with hypotension: No Has patient had a PCN reaction causing severe rash involving mucus membranes or skin necrosis: No Has patient had a PCN reaction that required hospitalization No Has patient had a PCN reaction occurring within the last 10 years: No If all of the above answers are "NO", then may proceed with Cephalosporin use.  . Prednisone Rash    Past Medical History:  Diagnosis Date  . Abnormal Pap smear    follow up was wnl  . Acne   . Hypotension    taken out of work r/t bp drops  . UTI (lower urinary tract infection)   . Vaginal Pap smear, abnormal    Social History   Socioeconomic History  . Marital status: Married    Spouse name: Not on file  . Number of children: 2  . Years of education: Not on file  . Highest education level: Not on file  Occupational History  . 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 Financial resource strain: Not on file  . Food insecurity:    Worry: Not on file    Inability: Not on file  . Transportation needs:    Medical: Not on file    Non-medical: Not on file  Tobacco Use  . Smoking status: Current Some Day Smoker    Packs/day: 0.50    Types: Cigarettes  . Smokeless tobacco: Never Used  Substance and Sexual Activity  . Alcohol use: No  . Drug use: No  . Sexual activity: Yes    Partners:  Male    Birth control/protection: IUD  Lifestyle  . Physical activity:    Days per week: Not on file    Minutes per session: Not on file  . Stress: Not on file  Relationships  . Social connections:    Talks on phone: Not on file    Gets together: Not on file    Attends religious service: Not on file    Active member of club or organization: Not on file    Attends meetings of clubs or organizations: Not on file    Relationship status: Not on file  Other Topics Concern  . Not on file  Social History Narrative  . Not on file   Family History  Problem Relation Age of Onset  . Hypertension  Mother   . Diabetes Maternal Grandmother   . Cancer Maternal Grandfather   . Anesthesia problems Neg Hx   . Hypotension Neg Hx   . Pseudochol deficiency Neg Hx   . Malignant hyperthermia Neg Hx    Past Surgical History:  Procedure Laterality Date  . WISDOM TOOTH EXTRACTION             Mardella LaymanHagler, Shirleymae Hauth, MD 10/12/18 [phone]

## 2018-10-09 NOTE — ED Triage Notes (Signed)
Patient reports a hang nail 2 weeks ago.  About one week ago, patient had a pus pocket at cuticle on right ring finger nailbed.  Patient did poke area to get pus out. Finger is now swelling again and spreading further down finger to hand

## 2018-12-06 ENCOUNTER — Encounter (HOSPITAL_COMMUNITY): Payer: Self-pay

## 2018-12-06 ENCOUNTER — Other Ambulatory Visit: Payer: Self-pay

## 2018-12-06 ENCOUNTER — Ambulatory Visit (HOSPITAL_COMMUNITY)
Admission: EM | Admit: 2018-12-06 | Discharge: 2018-12-06 | Disposition: A | Discharge status: Home / Self Care | Payer: Self-pay | Attending: Family Medicine | Admitting: Family Medicine

## 2018-12-06 DIAGNOSIS — B349 Viral infection, unspecified: Secondary | ICD-10-CM

## 2018-12-06 LAB — POCT RAPID STREP A: Streptococcus, Group A Screen (Direct): NEGATIVE

## 2018-12-06 MED ORDER — CETIRIZINE HCL 10 MG PO TABS: 10.0000 mg | ORAL_TABLET | Freq: Every day | ORAL | 0 refills | Status: DC

## 2018-12-06 NOTE — ED Provider Notes (Signed)
[facility]    CSN: [id number] Arrival date & time: 12/06/18  7169     History   Chief Complaint Chief Complaint  Patient presents with  . Sore Throat    HPI Victoria Wright is a 28 y.o. female.   Patient is a 28 year old female who presents with sore throat, myalgias, chills since yesterday.  Her symptoms have been constant and remain the same.  She has not take anything for her symptoms.  She has had some recent sick contacts with both of her children being sick.  Denies any recent traveling.  Denies any associated cough, congestion, rhinorrhea, nausea, vomiting, diarrhea.  ROS per HPI      Past Medical History:  Diagnosis Date  . Abnormal Pap smear    follow up was wnl  . Acne   . Hypotension    taken out of work r/t bp drops  . UTI (lower urinary tract infection)   . Vaginal Pap smear, abnormal     Patient Active Problem List   Diagnosis Date Noted  . Current smoker 01/09/2018    Past Surgical History:  Procedure Laterality Date  . WISDOM TOOTH EXTRACTION      OB History    Gravida  6   Para  3   Term  3   Preterm      AB  3   Living  3     SAB  2   TAB  1   Ectopic      Multiple  0   Live Births  3            Home Medications    Prior to Admission medications   Medication Sig Start Date End Date Taking? Authorizing Provider  albuterol (PROVENTIL HFA;VENTOLIN HFA) 108 (90 Base) MCG/ACT inhaler Inhale 2 puffs into the lungs every 6 (six) hours as needed for wheezing or shortness of breath. 08/02/17   Adam Phenix, MD  busPIRone (BUSPAR) 10 MG tablet Take 10 mg by mouth 3 (three) times daily.    [provider]  cetirizine (ZYRTEC) 10 MG tablet Take 1 tablet (10 mg total) by mouth daily. 12/06/18   Jaylyne Breese, Gloris Manchester A, NP  escitalopram (LEXAPRO) 10 MG tablet Take 10 mg by mouth daily.    [provider]  etonogestrel-ethinyl estradiol (NUVARING) 0.12-0.015 MG/24HR vaginal ring Insert vaginally and  leave in place for 3 consecutive weeks, then remove for 1 week. 01/09/18   Adam Phenix, MD  ibuprofen (ADVIL,MOTRIN) 600 MG tablet Take 1 tablet (600 mg total) by mouth every 6 (six) hours. 12/12/17   Pincus Large, DO    Family History Family History  Problem Relation Age of Onset  . Hypertension Mother   . Diabetes Maternal Grandmother   . Cancer Maternal Grandfather   . Anesthesia problems Neg Hx   . Hypotension Neg Hx   . Pseudochol deficiency Neg Hx   . Malignant hyperthermia Neg Hx     Social History Social History   Tobacco Use  . Smoking status: Current Some Day Smoker    Packs/day: 0.50    Types: Cigarettes  . Smokeless tobacco: Never Used  Substance Use Topics  . Alcohol use: No  . Drug use: No     Allergies   Amoxicillin and Prednisone   Review of Systems Review of Systems   Physical Exam Triage Vital Signs ED Triage Vitals  Enc Vitals Group     BP 12/06/18 0832 119/76  Pulse Rate 12/06/18 0832 65     Resp 12/06/18 0832 18     Temp 12/06/18 0832 99.1 F (37.3 C)     Temp src --      SpO2 12/06/18 0832 100 %     Weight 12/06/18 0831 190 lb (86.2 kg)     Height --      Head Circumference --      Peak Flow --      Pain Score 12/06/18 0831 8     Pain Loc --      Pain Edu? --      Excl. in GC? --    No data found.  Updated Vital Signs BP 119/76 (BP Location: Right Arm)   Pulse 65   Temp 99.1 F (37.3 C)   Resp 18   Wt 190 lb (86.2 kg)   LMP 11/29/2018   SpO2 100%   BMI 30.67 kg/m   Visual Acuity Right Eye Distance:   Left Eye Distance:   Bilateral Distance:    Right Eye Near:   Left Eye Near:    Bilateral Near:     Physical Exam Vitals signs and nursing note reviewed.  Constitutional:      General: She is not in acute distress.    Appearance: She is well-developed. She is not ill-appearing or toxic-appearing.  HENT:     Head: Normocephalic and atraumatic.     Right Ear: Tympanic membrane and ear canal normal.     Left  Ear: Tympanic membrane and ear canal normal.     Mouth/Throat:     Pharynx: Oropharynx is clear.     Tonsils: No tonsillar exudate. Swelling: 2+ on the right. 2+ on the left.  Eyes:     Conjunctiva/sclera: Conjunctivae normal.  Neck:     Musculoskeletal: Neck supple.  Cardiovascular:     Rate and Rhythm: Normal rate and regular rhythm.     Heart sounds: No murmur.  Pulmonary:     Effort: Pulmonary effort is normal. No respiratory distress.     Breath sounds: Normal breath sounds.  Abdominal:     Palpations: Abdomen is soft.     Tenderness: There is no abdominal tenderness.  Skin:    General: Skin is warm and dry.  Neurological:     Mental Status: She is alert.      UC Treatments / Results  Labs (all labs ordered are listed, but only abnormal results are displayed) Labs Reviewed  CULTURE, GROUP A STREP [facility]    EKG None  Radiology No results found.  Procedures Procedures (including critical care time)  Medications Ordered in UC Medications - No data to display  Initial Impression / Assessment and Plan / UC Course  I have reviewed the triage vital signs and the nursing notes.  Pertinent labs & imaging results that were available during my care of the patient were reviewed by me and considered in my medical decision making (see chart for details).     Rapid strep test negative Most likely viral illness She can take Tylenol or ibuprofen for the fever and pain Throat lozenges for sore throat Follow up as needed for continued or worsening symptoms  Final Clinical Impressions(s) / UC Diagnoses   Final diagnoses:  Viral illness     Discharge Instructions     Your rapid strep test was negative  This is most likely viral  You can do throat lozenges for sore throat Try zyrtec daily for congestion and drainage.  Follow up as needed for continued or worsening symptoms     ED Prescriptions    Medication Sig Dispense Auth. Provider   cetirizine (ZYRTEC)  10 MG tablet Take 1 tablet (10 mg total) by mouth daily. 30 tablet Dahlia Byes A, NP     Controlled Substance Prescriptions [place] Controlled Substance Registry consulted? Not Applicable   Janace Aris, NP 12/06/18 [phone]

## 2018-12-06 NOTE — ED Triage Notes (Signed)
Pt cc she has a sore throat. This started yesterday.

## 2018-12-06 NOTE — Discharge Instructions (Signed)
Your rapid strep test was negative  This is most likely viral  You can do throat lozenges for sore throat Try zyrtec daily for congestion and drainage.  Follow up as needed for continued or worsening symptoms

## 2018-12-08 LAB — CULTURE, GROUP A STREP (THRC)

## 2019-05-27 ENCOUNTER — Telehealth: Payer: Self-pay

## 2019-05-27 NOTE — Telephone Encounter (Signed)
Returned call and pt stated that she has been having irregular cycles, has not had a cycle in about 3 months, negative UPT. Pt states that she started taking antidepressants and is not sure if this is causing it. Pt has appt on 06-12-19, will discuss with provider.

## 2019-06-12 ENCOUNTER — Ambulatory Visit: Payer: Medicaid Other | Admitting: Obstetrics & Gynecology

## 2019-07-04 ENCOUNTER — Ambulatory Visit: Payer: Medicaid Other | Admitting: Certified Nurse Midwife

## 2019-08-04 ENCOUNTER — Encounter (HOSPITAL_COMMUNITY): Payer: Self-pay | Admitting: *Deleted

## 2019-08-04 ENCOUNTER — Ambulatory Visit (HOSPITAL_COMMUNITY)
Admission: EM | Admit: 2019-08-04 | Discharge: 2019-08-04 | Disposition: A | Discharge status: Home / Self Care | Payer: Medicaid Other

## 2019-08-04 ENCOUNTER — Other Ambulatory Visit: Payer: Self-pay

## 2019-08-04 DIAGNOSIS — M549 Dorsalgia, unspecified: Secondary | ICD-10-CM | POA: Diagnosis not present

## 2019-08-04 HISTORY — DX: Anxiety disorder, unspecified: F41.9

## 2019-08-04 HISTORY — DX: Depression, unspecified: F32.A

## 2019-08-04 MED ORDER — IBUPROFEN 800 MG PO TABS: 800.0000 mg | ORAL_TABLET | Freq: Three times a day (TID) | ORAL | 0 refills | Status: DC | PRN

## 2019-08-04 MED ORDER — CYCLOBENZAPRINE HCL 10 MG PO TABS: 10.0000 mg | ORAL_TABLET | Freq: Two times a day (BID) | ORAL | 0 refills | Status: DC | PRN

## 2019-08-04 NOTE — ED Provider Notes (Signed)
[facility]    CSN: [id number] Arrival date & time: 08/04/19  1119      History   Chief Complaint Chief Complaint  Patient presents with  . Back Pain  . Shoulder Pain    HPI Victoria Wright is a 28 y.o. female.   Patient presents with right upper back pain x3 days.  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  She reports intermittent mild tingling in her fingers.  She denies weakness or numbness.  LMP: 07/12/2019, IUD.  The history is provided by the patient.    Past Medical History:  Diagnosis Date  . Abnormal Pap smear    follow up was wnl  . Acne   . Anxiety   . Depression   . Hypotension    taken out of work r/t bp drops  . UTI (lower urinary tract infection)   . Vaginal Pap smear, abnormal     Patient Active Problem List   Diagnosis Date Noted  . Current smoker 01/09/2018    Past Surgical History:  Procedure Laterality Date  . WISDOM TOOTH EXTRACTION      OB History    Gravida  6   Para  3   Term  3   Preterm      AB  3   Living  3     SAB  2   TAB  1   Ectopic      Multiple  0   Live Births  3            Home Medications    Prior to Admission medications   Medication Sig Start Date End Date Taking? Authorizing Provider  buPROPion HCl (WELLBUTRIN PO) Take by mouth.   Yes [provider]  busPIRone (BUSPAR) 10 MG tablet Take 10 mg by mouth 3 (three) times daily.   Yes [provider]  escitalopram (LEXAPRO) 10 MG tablet Take 10 mg by mouth daily.   Yes [provider]  albuterol (PROVENTIL HFA;VENTOLIN HFA) 108 (90 Base) MCG/ACT inhaler Inhale 2 puffs into the lungs every 6 (six) hours as needed for wheezing or shortness of breath. 08/02/17   Adam PhenixArnold, James G, MD  cetirizine (ZYRTEC) 10 MG tablet Take 1 tablet (10 mg total) by mouth daily. 12/06/18   Dahlia ByesBast, Traci A, NP  cyclobenzaprine (FLEXERIL) 10 MG tablet Take 1 tablet (10 mg total) by mouth 2  (two) times daily as needed for muscle spasms. 08/04/19   Mickie Bailate, Madisin Hasan H, NP  etonogestrel-ethinyl estradiol (NUVARING) 0.12-0.015 MG/24HR vaginal ring Insert vaginally and leave in place for 3 consecutive weeks, then remove for 1 week. 01/09/18   Adam PhenixArnold, James G, MD  ibuprofen (ADVIL) 800 MG tablet Take 1 tablet (800 mg total) by mouth every 8 (eight) hours as needed. 08/04/19   Mickie Bailate, Deserie Dirks H, NP    Family History Family History  Problem Relation Age of Onset  . Hypertension Mother   . Diabetes Maternal Grandmother   . Cancer Maternal Grandfather   . Anesthesia problems Neg Hx   . Hypotension Neg Hx   . Pseudochol deficiency Neg Hx   . Malignant hyperthermia Neg Hx     Social History Social History   Tobacco Use  . Smoking status: Current Every Day Smoker    Packs/day: 0.50    Types: Cigarettes  . Smokeless tobacco: Never Used  Substance Use Topics  . Alcohol use: Yes  Alcohol/week: 5.0 standard drinks    Types: 5 Glasses of wine per week  . Drug use: No     Allergies   Amoxicillin and Prednisone   Review of Systems Review of Systems  Constitutional: Negative for chills and fever.  HENT: Negative for ear pain and sore throat.   Eyes: Negative for pain and visual disturbance.  Respiratory: Negative for cough and shortness of breath.   Cardiovascular: Negative for chest pain and palpitations.  Gastrointestinal: Negative for abdominal pain and vomiting.  Genitourinary: Negative for dysuria and hematuria.  Musculoskeletal: Positive for back pain.  Skin: Negative for color change and rash.  Neurological: Negative for seizures, syncope, weakness and numbness.  All other systems reviewed and are negative.    Physical Exam Triage Vital Signs ED Triage Vitals [08/04/19 1149]  Enc Vitals Group     BP 128/67     Pulse Rate 92     Resp 16     Temp 98.1 F (36.7 C)     Temp Source Other     SpO2 98 %     Weight      Height      Head Circumference      Peak Flow       Pain Score 8     Pain Loc      Pain Edu?      Excl. in [place]?    No data found.  Updated Vital Signs BP 128/67   Pulse 92   Temp 98.1 F (36.7 C) (Other (Comment))   Resp 16   SpO2 98%   Breastfeeding No   Visual Acuity Right Eye Distance:   Left Eye Distance:   Bilateral Distance:    Right Eye Near:   Left Eye Near:    Bilateral Near:     Physical Exam Vitals signs and nursing note reviewed.  Constitutional:      General: She is not in acute distress.    Appearance: She is well-developed.  HENT:     Head: Normocephalic and atraumatic.  Eyes:     Conjunctiva/sclera: Conjunctivae normal.  Neck:     Musculoskeletal: Neck supple.  Cardiovascular:     Rate and Rhythm: Normal rate and regular rhythm.     Heart sounds: No murmur.  Pulmonary:     Effort: Pulmonary effort is normal. No respiratory distress.     Breath sounds: Normal breath sounds.  Abdominal:     General: Bowel sounds are normal.     Palpations: Abdomen is soft.     Tenderness: There is no abdominal tenderness. There is no guarding or rebound.  Musculoskeletal: Normal range of motion.        General: No swelling, tenderness or deformity.     Comments: Nontender to palpation.  Skin:    General: Skin is warm and dry.     Capillary Refill: Capillary refill takes less than 2 seconds.     Findings: No bruising, erythema, lesion or rash.  Neurological:     General: No focal deficit present.     Mental Status: She is alert and oriented to person, place, and time.     Sensory: No sensory deficit.     Motor: No weakness.      UC Treatments / Results  Labs (all labs ordered are listed, but only abnormal results are displayed) Labs Reviewed - No data to display  EKG   Radiology No results found.  Procedures Procedures (including critical care time)  Medications  Ordered in UC Medications - No data to display  Initial Impression / Assessment and Plan / UC Course  I have reviewed the triage  vital signs and the nursing notes.  Pertinent labs & imaging results that were available during my care of the patient were reviewed by me and considered in my medical decision making (see chart for details).    Acute right side upper back pain.  Treating with ibuprofen and Flexeril.  Precautions for drowsiness with Flexeril discussed with patient.  Instructed her to return here or follow-up with an orthopedist if her pain is not improving or if she develops new symptoms such as numbness or weakness.  Patient agrees to plan of care.     Final Clinical Impressions(s) / UC Diagnoses   Final diagnoses:  Acute right-sided back pain, unspecified back location     Discharge Instructions     Take the prescribed ibuprofen as needed for your pain.  Take the muscle relaxer Flexeril as needed for muscle spasm; do not drive, operate machinery, or drink alcohol with this medication as it may make you drowsy.    Return here or follow up with an orthopedist if your pain is not improving or gets worse; or if you develop new symptoms such as numbness or weakness.         ED Prescriptions    Medication Sig Dispense Auth. Provider   cyclobenzaprine (FLEXERIL) 10 MG tablet Take 1 tablet (10 mg total) by mouth 2 (two) times daily as needed for muscle spasms. 20 tablet Mickie Bail, NP   ibuprofen (ADVIL) 800 MG tablet Take 1 tablet (800 mg total) by mouth every 8 (eight) hours as needed. 21 tablet Mickie Bail, NP     PDMP not reviewed this encounter.   Mickie Bail, NP 08/04/19 1225

## 2019-08-04 NOTE — ED Triage Notes (Signed)
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  States having difficulty allowing RUE to relax, as she feels she has to keep her right shoulder "lifted up" or pain increases.  Denies any parasthesias.

## 2019-08-04 NOTE — Discharge Instructions (Signed)
Take the prescribed ibuprofen as needed for your pain.  Take the muscle relaxer Flexeril as needed for muscle spasm; do not drive, operate machinery, or drink alcohol with this medication as it may make you drowsy.    Return here or follow up with an orthopedist if your pain is not improving or gets worse; or if you develop new symptoms such as numbness or weakness.

## 2019-11-05 ENCOUNTER — Ambulatory Visit: Payer: Medicaid Other | Attending: Internal Medicine

## 2019-11-05 DIAGNOSIS — U071 Other skin changes: Secondary | ICD-10-CM

## 2019-11-05 DIAGNOSIS — R238 Other skin changes: Secondary | ICD-10-CM

## 2019-11-06 LAB — NOVEL CORONAVIRUS, NAA: SARS-CoV-2, NAA: NOT DETECTED

## 2019-11-12 ENCOUNTER — Telehealth: Payer: Self-pay | Admitting: *Deleted

## 2019-11-12 NOTE — Telephone Encounter (Signed)
Pt returned call for automatic call for negative COVID results. Results given, patient has no questions.

## 2020-02-03 ENCOUNTER — Encounter (HOSPITAL_COMMUNITY): Payer: Self-pay

## 2020-02-03 ENCOUNTER — Other Ambulatory Visit: Payer: Self-pay

## 2020-02-03 ENCOUNTER — Ambulatory Visit (HOSPITAL_COMMUNITY)
Admission: EM | Admit: 2020-02-03 | Discharge: 2020-02-03 | Disposition: A | Discharge status: Home / Self Care | Payer: Medicaid Other | Attending: Family Medicine | Admitting: Family Medicine

## 2020-02-03 DIAGNOSIS — Z8249 Family history of ischemic heart disease and other diseases of the circulatory system: Secondary | ICD-10-CM | POA: Diagnosis not present

## 2020-02-03 DIAGNOSIS — Z888 Allergy status to other drugs, medicaments and biological substances status: Secondary | ICD-10-CM | POA: Diagnosis not present

## 2020-02-03 DIAGNOSIS — F17210 Nicotine dependence, cigarettes, uncomplicated: Secondary | ICD-10-CM | POA: Diagnosis not present

## 2020-02-03 DIAGNOSIS — J019 Acute sinusitis, unspecified: Secondary | ICD-10-CM | POA: Insufficient documentation

## 2020-02-03 DIAGNOSIS — Z20822 Contact with and (suspected) exposure to covid-19: Secondary | ICD-10-CM | POA: Insufficient documentation

## 2020-02-03 DIAGNOSIS — Z833 Family history of diabetes mellitus: Secondary | ICD-10-CM | POA: Insufficient documentation

## 2020-02-03 DIAGNOSIS — Z79899 Other long term (current) drug therapy: Secondary | ICD-10-CM | POA: Insufficient documentation

## 2020-02-03 DIAGNOSIS — Z880 Allergy status to penicillin: Secondary | ICD-10-CM | POA: Insufficient documentation

## 2020-02-03 LAB — POCT RAPID STREP A: Streptococcus, Group A Screen (Direct): NEGATIVE

## 2020-02-03 MED ORDER — DM-GUAIFENESIN ER 30-600 MG PO TB12: 1.0000 | ORAL_TABLET | Freq: Two times a day (BID) | ORAL | 0 refills | Status: DC

## 2020-02-03 MED ORDER — DOXYCYCLINE HYCLATE 100 MG PO CAPS: 100.0000 mg | ORAL_CAPSULE | Freq: Two times a day (BID) | ORAL | 0 refills | Status: AC

## 2020-02-03 MED ORDER — CETIRIZINE HCL 10 MG PO CAPS: 10.0000 mg | ORAL_CAPSULE | Freq: Every day | ORAL | 0 refills | Status: DC

## 2020-02-03 NOTE — Discharge Instructions (Signed)
Strep test negative, Covid test pending Begin doxycycline twice daily for the next 10 days Begin daily cetirizine to help with congestion and drainage Mucinex DM twice daily Drink plenty of fluids  Follow-up if symptoms not improving or worsening

## 2020-02-03 NOTE — ED Provider Notes (Signed)
[facility]    CSN: [id number] Arrival date & time: 02/03/20  1515      History   Chief Complaint Chief Complaint  Patient presents with  . Chest Pain  . Hemoptysis    HPI Victoria Wright is a 29 y.o. female history of tobacco use presenting today for evaluation of URI symptoms.  Patient notes that over the past 1 to 2 weeks she has had cough and congestion with associated throat irritation.  Patient notes that mainly in the morning she has a sore throat and a productive cough to clear mucus from her throat.  Symptoms subside during the day.  She is concerned as as time has progressed she has noticed changes in color of the sputum and today has noticed some streaking of blood.  She has had some central chest discomfort, but denies associated shortness of breath.  She feels the coughing is mainly stemming from the throat rather than deeper into the lungs.  Reports smoking approximately 5 to 6 cigarettes/day.  She reports history of asthma as a child, but no recent issues.  Denies any known exposure to Covid.  Denies any fevers chills or body aches.  Feels her nasal airway has been dry, occasional crusting.  Denies significant rhinorrhea.  HPI  Past Medical History:  Diagnosis Date  . Abnormal Pap smear    follow up was wnl  . Acne   . Anxiety   . Depression   . Hypotension    taken out of work r/t bp drops  . UTI (lower urinary tract infection)   . Vaginal Pap smear, abnormal     Patient Active Problem List   Diagnosis Date Noted  . Current smoker 01/09/2018    Past Surgical History:  Procedure Laterality Date  . WISDOM TOOTH EXTRACTION      OB History    Gravida  6   Para  3   Term  3   Preterm      AB  3   Living  3     SAB  2   TAB  1   Ectopic      Multiple  0   Live Births  3            Home Medications    Prior to Admission medications   Medication Sig Start Date End Date Taking? Authorizing Provider  albuterol  (PROVENTIL HFA;VENTOLIN HFA) 108 (90 Base) MCG/ACT inhaler Inhale 2 puffs into the lungs every 6 (six) hours as needed for wheezing or shortness of breath. 08/02/17   Adam Phenix, MD  buPROPion HCl (WELLBUTRIN PO) Take by mouth.    [provider]  busPIRone (BUSPAR) 10 MG tablet Take 10 mg by mouth 3 (three) times daily.    [provider]  Cetirizine HCl 10 MG CAPS Take 1 capsule (10 mg total) by mouth daily. 02/03/20   Preston Garabedian C, PA-C  cyclobenzaprine (FLEXERIL) 10 MG tablet Take 1 tablet (10 mg total) by mouth 2 (two) times daily as needed for muscle spasms. 08/04/19   Mickie Bail, NP  dextromethorphan-guaiFENesin [facility] DM) 30-600 MG 12hr tablet Take 1 tablet by mouth 2 (two) times daily. 02/03/20   Johonna Binette C, PA-C  doxycycline (VIBRAMYCIN) 100 MG capsule Take 1 capsule (100 mg total) by mouth 2 (two) times daily for 10 days. 02/03/20 02/13/20  Khiry Pasquariello C, PA-C  escitalopram (LEXAPRO) 10 MG tablet Take 10 mg by mouth daily.  [provider]  etonogestrel-ethinyl estradiol (NUVARING) 0.12-0.015 MG/24HR vaginal ring Insert vaginally and leave in place for 3 consecutive weeks, then remove for 1 week. 01/09/18   Adam Phenix, MD  ibuprofen (ADVIL) 800 MG tablet Take 1 tablet (800 mg total) by mouth every 8 (eight) hours as needed. 08/04/19   Mickie Bail, NP    Family History Family History  Problem Relation Age of Onset  . Hypertension Mother   . Diabetes Maternal Grandmother   . Cancer Maternal Grandfather   . Anesthesia problems Neg Hx   . Hypotension Neg Hx   . Pseudochol deficiency Neg Hx   . Malignant hyperthermia Neg Hx     Social History Social History   Tobacco Use  . Smoking status: Current Every Day Smoker    Packs/day: 0.25    Types: Cigarettes  . Smokeless tobacco: Never Used  Substance Use Topics  . Alcohol use: Yes    Alcohol/week: 5.0 standard drinks    Types: 5 Glasses of wine per week  . Drug use: No      Allergies   Amoxicillin and Prednisone   Review of Systems Review of Systems  Constitutional: Negative for activity change, appetite change, chills, fatigue and fever.  HENT: Positive for congestion and sore throat. Negative for ear pain, rhinorrhea, sinus pressure and trouble swallowing.   Eyes: Negative for discharge and redness.  Respiratory: Positive for cough. Negative for chest tightness and shortness of breath.   Cardiovascular: Negative for chest pain.  Gastrointestinal: Negative for abdominal pain, diarrhea, nausea and vomiting.  Musculoskeletal: Negative for myalgias.  Skin: Negative for rash.  Neurological: Negative for dizziness, light-headedness and headaches.     Physical Exam Triage Vital Signs ED Triage Vitals  Enc Vitals Group     BP 02/03/20 1526 135/81     Pulse Rate 02/03/20 1526 72     Resp 02/03/20 1526 16     Temp --      Temp src --      SpO2 02/03/20 1526 100 %     Weight 02/03/20 1524 218 lb (98.9 kg)     Height --      Head Circumference --      Peak Flow --      Pain Score 02/03/20 1524 6     Pain Loc --      Pain Edu? --      Excl. in [place]? --    No data found.  Updated Vital Signs BP 135/81 (BP Location: Left Arm)   Pulse 72   Resp 16   Wt 218 lb (98.9 kg)   SpO2 100%   BMI 35.19 kg/m   Visual Acuity Right Eye Distance:   Left Eye Distance:   Bilateral Distance:    Right Eye Near:   Left Eye Near:    Bilateral Near:     Physical Exam Vitals and nursing note reviewed.  Constitutional:      Appearance: She is well-developed.     Comments: No acute distress  HENT:     Head: Normocephalic and atraumatic.     Ears:     Comments: Bilateral ears without tenderness to palpation of external auricle, tragus and mastoid, EAC's without erythema or swelling, TM's with good bony landmarks and cone of light. Non erythematous.     Nose: Nose normal.     Mouth/Throat:     Comments: Oral mucosa pink and moist, no tonsillar  enlargement, appears mildly erythematous, isolated  exudate noted on right tonsil,. Posterior pharynx patent and nonerythematous, no uvula deviation or swelling. Normal phonation. Eyes:     Conjunctiva/sclera: Conjunctivae normal.  Cardiovascular:     Rate and Rhythm: Normal rate.  Pulmonary:     Effort: Pulmonary effort is normal. No respiratory distress.     Comments: Breathing comfortably at rest, CTABL, no wheezing, rales or other adventitious sounds auscultated Abdominal:     General: There is no distension.  Musculoskeletal:        General: Normal range of motion.     Cervical back: Neck supple.  Skin:    General: Skin is warm and dry.  Neurological:     Mental Status: She is alert and oriented to person, place, and time.      UC Treatments / Results  Labs (all labs ordered are listed, but only abnormal results are displayed) Labs Reviewed  SARS CORONAVIRUS 2 (TAT 6-24 HRS)  CULTURE, GROUP A STREP [facility])  POCT RAPID STREP A    EKG   Radiology No results found.  Procedures Procedures (including critical care time)  Medications Ordered in UC Medications - No data to display  Initial Impression / Assessment and Plan / UC Course  I have reviewed the triage vital signs and the nursing notes.  Pertinent labs & imaging results that were available during my care of the patient were reviewed by me and considered in my medical decision making (see chart for details).     Covid PCR pending.  Symptoms suggestive of likely sinusitis with secondary postnasal drainage.  Suspect blood-tinged sputum from throat irritation.  Lungs clear, vital signs stable without fever, tachycardia or hypoxia.  Do not suspect pneumonia at this time.  Treating with doxycycline for sinusitis along with initiating on cetirizine and Mucinex DM for further relief of mucus/cough.  Discussed strict return precautions. Patient verbalized understanding and is agreeable with plan.  Final Clinical  Impressions(s) / UC Diagnoses   Final diagnoses:  Acute sinusitis with symptoms > 10 days     Discharge Instructions     Strep test negative, Covid test pending Begin doxycycline twice daily for the next 10 days Begin daily cetirizine to help with congestion and drainage Mucinex DM twice daily Drink plenty of fluids  Follow-up if symptoms not improving or worsening   ED Prescriptions    Medication Sig Dispense Auth. Provider   doxycycline (VIBRAMYCIN) 100 MG capsule Take 1 capsule (100 mg total) by mouth 2 (two) times daily for 10 days. 20 capsule Presley Summerlin C, PA-C   dextromethorphan-guaiFENesin (MUCINEX DM) 30-600 MG 12hr tablet Take 1 tablet by mouth 2 (two) times daily. 20 tablet Parisha Beaulac C, PA-C   Cetirizine HCl 10 MG CAPS Take 1 capsule (10 mg total) by mouth daily. 20 capsule Dorathea Faerber, [place] C, PA-C     PDMP not reviewed this encounter.   Janith Lima, [place] 02/03/20 [phone]

## 2020-02-03 NOTE — ED Triage Notes (Signed)
C/o cough x2wk in the mornings. Reports white patch in the back of her throat. Reports when she coughs in the morning it ranges in color from green to brown to bloody streaks. Chest pain started today.

## 2020-02-04 LAB — SARS CORONAVIRUS 2 (TAT 6-24 HRS): SARS Coronavirus 2: NEGATIVE

## 2020-02-07 LAB — CULTURE, GROUP A STREP (THRC)

## 2020-07-26 ENCOUNTER — Other Ambulatory Visit: Payer: Self-pay

## 2020-07-26 ENCOUNTER — Ambulatory Visit (HOSPITAL_COMMUNITY)
Admission: EM | Admit: 2020-07-26 | Discharge: 2020-07-26 | Disposition: A | Discharge status: Home / Self Care | Payer: Medicaid Other | Attending: Family Medicine | Admitting: Family Medicine

## 2020-07-26 ENCOUNTER — Encounter (HOSPITAL_COMMUNITY): Payer: Self-pay | Admitting: Emergency Medicine

## 2020-07-26 DIAGNOSIS — L209 Atopic dermatitis, unspecified: Secondary | ICD-10-CM

## 2020-07-26 DIAGNOSIS — N898 Other specified noninflammatory disorders of vagina: Secondary | ICD-10-CM

## 2020-07-26 DIAGNOSIS — R21 Rash and other nonspecific skin eruption: Secondary | ICD-10-CM

## 2020-07-26 MED ORDER — TRIAMCINOLONE ACETONIDE 0.1 % EX CREA: 1.0000 "application " | TOPICAL_CREAM | Freq: Two times a day (BID) | CUTANEOUS | 0 refills | Status: DC

## 2020-07-26 NOTE — ED Triage Notes (Signed)
Reports having a sore in vaginal area 3-4 years ago after shaving.  One year later reoccurred after shaving.  This episode started 3 days ago after shaving.  Usually occurs one week after shaving  Raw area, oozing on labia Patient has googled symptoms and now has other concerns.

## 2020-07-26 NOTE — Discharge Instructions (Signed)
I think that your skin is irritated at your vagina.  I would recommend not shaving again  I do not think this is anything sexually transmitted  You may use coconut oil, Aquaphor to the area and it will heal on its own rather quickly.  As far as the rash on your chest goes, I have sent in some triamcinolone cream for you to use twice a day as needed  Follow-up with this office or with primary care as needed

## 2020-07-26 NOTE — ED Provider Notes (Signed)
[facility]   [id number] 07/26/20 Arrival Time: 1014  CC: RASH  SUBJECTIVE:  Victoria Wright is a 29 y.o. female who presents with a skin complaint that began about 3 weeks ago when she shaved her vagina.  Reports that she has an irritated place along her perineum.  Reports that every time she shaves, this happens.  Reports that the area is painful with urination, mildly tender to touch.  No drainage.  Also reports that she has a rash that happens to her chest especially when it gets hot.  She describes this as red raised and itchy.  Uses Dial soap to wash the area.  Does not apply any medications. Denies changes in soaps, detergents, close contacts with similar rash, known trigger or environmental trigger, allergy. Denies medications change or starting a new medication recently. There are no aggravating or alleviating factors. Denies fever, chills, nausea, vomiting, erythema, swelling, discharge, oral lesions, SOB, chest pain, abdominal pain, changes in bowel or bladder function.    ROS: As per HPI.  All other pertinent ROS negative.     Past Medical History:  Diagnosis Date  . Abnormal Pap smear    follow up was wnl  . Acne   . Anxiety   . Depression   . Hypotension    taken out of work r/t bp drops  . UTI (lower urinary tract infection)   . Vaginal Pap smear, abnormal    Past Surgical History:  Procedure Laterality Date  . WISDOM TOOTH EXTRACTION     Allergies  Allergen Reactions  . Amoxicillin Rash and Other (See Comments)    Has patient had a PCN reaction causing immediate rash, facial/tongue/throat swelling, SOB or lightheadedness with hypotension: No Has patient had a PCN reaction causing severe rash involving mucus membranes or skin necrosis: No Has patient had a PCN reaction that required hospitalization No Has patient had a PCN reaction occurring within the last 10 years: No If all of the above answers are "NO", then may proceed with Cephalosporin use.    . Prednisone Rash   No current facility-administered medications on file prior to encounter.   Current Outpatient Medications on File Prior to Encounter  Medication Sig Dispense Refill  . albuterol (PROVENTIL HFA;VENTOLIN HFA) 108 (90 Base) MCG/ACT inhaler Inhale 2 puffs into the lungs every 6 (six) hours as needed for wheezing or shortness of breath. 1 Inhaler 1  . buPROPion HCl (WELLBUTRIN PO) Take by mouth.    . busPIRone (BUSPAR) 10 MG tablet Take 10 mg by mouth 3 (three) times daily.    . Cetirizine HCl 10 MG CAPS Take 1 capsule (10 mg total) by mouth daily. 20 capsule 0  . cyclobenzaprine (FLEXERIL) 10 MG tablet Take 1 tablet (10 mg total) by mouth 2 (two) times daily as needed for muscle spasms. 20 tablet 0  . dextromethorphan-guaiFENesin (MUCINEX DM) 30-600 MG 12hr tablet Take 1 tablet by mouth 2 (two) times daily. 20 tablet 0  . escitalopram (LEXAPRO) 10 MG tablet Take 10 mg by mouth daily.    Marland Kitchen etonogestrel-ethinyl estradiol (NUVARING) 0.12-0.015 MG/24HR vaginal ring Insert vaginally and leave in place for 3 consecutive weeks, then remove for 1 week. 1 each 12  . ibuprofen (ADVIL) 800 MG tablet Take 1 tablet (800 mg total) by mouth every 8 (eight) hours as needed. 21 tablet 0   Social History   Socioeconomic History  . Marital status: Married    Spouse name: Not on file  . Number of  children: 2  . Years of education: Not on file  . Highest education level: Not on file  Occupational History  . Occupation: CNA    Employer: [employer]  Tobacco Use  . Smoking status: Current Every Day Smoker    Packs/day: 0.25    Types: Cigarettes  . Smokeless tobacco: Never Used  Vaping Use  . Vaping Use: Never used  Substance and Sexual Activity  . Alcohol use: Yes    Alcohol/week: 5.0 standard drinks    Types: 5 Glasses of wine per week  . Drug use: No  . Sexual activity: Not on file  Other Topics Concern  . Not on file  Social History Narrative  . Not on file    Social Determinants of Health   Financial Resource Strain:   . Difficulty of Paying Living Expenses: Not on file  Food Insecurity:   . Worried About Programme researcher, broadcasting/film/video in the Last Year: Not on file  . Ran Out of Food in the Last Year: Not on file  Transportation Needs:   . Lack of Transportation (Medical): Not on file  . Lack of Transportation (Non-Medical): Not on file  Physical Activity:   . Days of Exercise per Week: Not on file  . Minutes of Exercise per Session: Not on file  Stress:   . Feeling of Stress : Not on file  Social Connections:   . Frequency of Communication with Friends and Family: Not on file  . Frequency of Social Gatherings with Friends and Family: Not on file  . Attends Religious Services: Not on file  . Active Member of Clubs or Organizations: Not on file  . Attends Banker Meetings: Not on file  . Marital Status: Not on file  Intimate Partner Violence:   . Fear of Current or Ex-Partner: Not on file  . Emotionally Abused: Not on file  . Physically Abused: Not on file  . Sexually Abused: Not on file   Family History  Problem Relation Age of Onset  . Hypertension Mother   . Diabetes Maternal Grandmother   . Cancer Maternal Grandfather   . Anesthesia problems Neg Hx   . Hypotension Neg Hx   . Pseudochol deficiency Neg Hx   . Malignant hyperthermia Neg Hx     OBJECTIVE: Vitals:   07/26/20 1044  BP: 130/85  Pulse: 96  Resp: 18  Temp: 99.5 F (37.5 C)  TempSrc: Oral  SpO2: 100%    General appearance: alert; no distress Head: NCAT Lungs: clear to auscultation bilaterally Heart: regular rate and rhythm.  Radial pulse 2+ bilaterally Extremities: no edema GU: Noted that there were 2 lesions to the left side of the vulva.  These appear to be consistent with folliculitis, also appear to be healing very well Skin: warm and dry; fine papular rash to the chest, with areas of darkened skin surrounding the area.  Appears that the darkened  skin are healed areas of the rash. Psychological: alert and cooperative; normal mood and affect  ASSESSMENT & PLAN:  1. Vaginal irritation   2. Rash and nonspecific skin eruption   3. Atopic dermatitis, unspecified type     Meds ordered this encounter  Medications  . triamcinolone cream (KENALOG) 0.1 %    Sig: Apply 1 application topically 2 (two) times daily.    Dispense:  30 g    Refill:  0    Order Specific Question:   Supervising Provider    Answer:  Merrilee Jansky [[id number]]    Prescribed triamcinolone to use for rash on chest May use coconut oil to the vulva Take as prescribed and to completion Avoid hot showers/ baths Moisturize skin daily  Follow up with PCP if symptoms persists Return or go to the ER if you have any new or worsening symptoms such as fever, chills, nausea, vomiting, redness, swelling, discharge, if symptoms do not improve with medications  Reviewed expectations re: course of current medical issues. Questions answered. Outlined signs and symptoms indicating need for more acute intervention. Patient verbalized understanding. After Visit Summary given.   Moshe Cipro, NP 07/26/20 1138

## 2020-07-28 ENCOUNTER — Encounter (HOSPITAL_COMMUNITY): Payer: Self-pay | Admitting: *Deleted

## 2020-07-28 ENCOUNTER — Other Ambulatory Visit: Payer: Self-pay

## 2020-07-28 ENCOUNTER — Ambulatory Visit (HOSPITAL_COMMUNITY)
Admission: EM | Admit: 2020-07-28 | Discharge: 2020-07-28 | Disposition: A | Discharge status: Home / Self Care | Payer: Self-pay | Attending: Physician Assistant | Admitting: Physician Assistant

## 2020-07-28 DIAGNOSIS — N758 Other diseases of Bartholin's gland: Secondary | ICD-10-CM

## 2020-07-28 MED ORDER — KETOROLAC TROMETHAMINE 60 MG/2ML IM SOLN
INTRAMUSCULAR | Status: AC
  Filled 2020-07-28: qty 2

## 2020-07-28 MED ORDER — NAPROXEN 500 MG PO TABS: 500.0000 mg | ORAL_TABLET | Freq: Two times a day (BID) | ORAL | 0 refills | Status: DC

## 2020-07-28 MED ORDER — KETOROLAC TROMETHAMINE 60 MG/2ML IM SOLN
60.0000 mg | Freq: Once | INTRAMUSCULAR | Status: AC
  Administered 2020-07-28: 60 mg via INTRAMUSCULAR

## 2020-07-28 MED ORDER — SULFAMETHOXAZOLE-TRIMETHOPRIM 800-160 MG PO TABS: 1.0000 | ORAL_TABLET | Freq: Two times a day (BID) | ORAL | 0 refills | Status: AC

## 2020-07-28 MED ORDER — METRONIDAZOLE 500 MG PO TABS: 500.0000 mg | ORAL_TABLET | Freq: Two times a day (BID) | ORAL | 0 refills | Status: DC

## 2020-07-28 NOTE — Discharge Instructions (Signed)
Take the antibiotics as prescribed Take naproxen as prescribed for the next 4 to 5 days  If you are not improving significantly over the next 2 to 3 days, follow-up with your OB/GYN or this clinic.  If you do not have an OB/GYN call the call center above

## 2020-07-28 NOTE — ED Triage Notes (Signed)
Patient here for follow up after visit on the 19th for raw area to labia, states that it is worse, larger and more painful than during visit on the 19th.

## 2020-07-28 NOTE — ED Provider Notes (Signed)
[facility]    CSN: [id number] Arrival date & time: 07/28/20  1513      History   Chief Complaint Chief Complaint  Patient presents with  . Recurrent Skin Infections    HPI Victoria Wright is a 29 y.o. female.   Patient reports for follow-up of recent visit of suspected folliculitis and skin irritation in the left side of her vaginal labia.  She reports since then has become more painful and there is been a little more swelling.  She denies any drainage.  Denies fevers or chills.  She reports prior to the last visit she has small bumps develop several days after shaving.     Past Medical History:  Diagnosis Date  . Abnormal Pap smear    follow up was wnl  . Acne   . Anxiety   . Depression   . Hypotension    taken out of work r/t bp drops  . UTI (lower urinary tract infection)   . Vaginal Pap smear, abnormal     Patient Active Problem List   Diagnosis Date Noted  . Current smoker 01/09/2018    Past Surgical History:  Procedure Laterality Date  . WISDOM TOOTH EXTRACTION      OB History    Gravida  6   Para  3   Term  3   Preterm      AB  3   Living  3     SAB  2   TAB  1   Ectopic      Multiple  0   Live Births  3            Home Medications    Prior to Admission medications   Medication Sig Start Date End Date Taking? Authorizing Provider  albuterol (PROVENTIL HFA;VENTOLIN HFA) 108 (90 Base) MCG/ACT inhaler Inhale 2 puffs into the lungs every 6 (six) hours as needed for wheezing or shortness of breath. 08/02/17   Adam Phenix, MD  buPROPion HCl (WELLBUTRIN PO) Take by mouth.    [provider]  busPIRone (BUSPAR) 10 MG tablet Take 10 mg by mouth 3 (three) times daily.    [provider]  Cetirizine HCl 10 MG CAPS Take 1 capsule (10 mg total) by mouth daily. 02/03/20   Wieters, Hallie C, PA-C  cyclobenzaprine (FLEXERIL) 10 MG tablet Take 1 tablet (10 mg total) by mouth 2 (two) times daily as needed  for muscle spasms. 08/04/19   Mickie Bail, NP  dextromethorphan-guaiFENesin [facility] DM) 30-600 MG 12hr tablet Take 1 tablet by mouth 2 (two) times daily. 02/03/20   Wieters, Hallie C, PA-C  escitalopram (LEXAPRO) 10 MG tablet Take 10 mg by mouth daily.    [provider]  etonogestrel-ethinyl estradiol (NUVARING) 0.12-0.015 MG/24HR vaginal ring Insert vaginally and leave in place for 3 consecutive weeks, then remove for 1 week. 01/09/18   Adam Phenix, MD  ibuprofen (ADVIL) 800 MG tablet Take 1 tablet (800 mg total) by mouth every 8 (eight) hours as needed. 08/04/19   Mickie Bail, NP  metroNIDAZOLE (FLAGYL) 500 MG tablet Take 1 tablet (500 mg total) by mouth 2 (two) times daily. 07/28/20   Ohn Bostic, Veryl Speak, PA-C  naproxen (NAPROSYN) 500 MG tablet Take 1 tablet (500 mg total) by mouth 2 (two) times daily. 07/28/20   Brielyn Bosak, Veryl Speak, PA-C  sulfamethoxazole-trimethoprim (BACTRIM DS) 800-160 MG tablet Take 1 tablet by mouth 2 (two) times daily for 7 days. 07/28/20  08/04/20  Gracyn Allor, Veryl Speak, PA-C  triamcinolone cream (KENALOG) 0.1 % Apply 1 application topically 2 (two) times daily. 07/26/20   Moshe Cipro, NP    Family History Family History  Problem Relation Age of Onset  . Hypertension Mother   . Diabetes Maternal Grandmother   . Cancer Maternal Grandfather   . Anesthesia problems Neg Hx   . Hypotension Neg Hx   . Pseudochol deficiency Neg Hx   . Malignant hyperthermia Neg Hx     Social History Social History   Tobacco Use  . Smoking status: Current Every Day Smoker    Packs/day: 0.25    Types: Cigarettes  . Smokeless tobacco: Never Used  Vaping Use  . Vaping Use: Never used  Substance Use Topics  . Alcohol use: Yes    Alcohol/week: 5.0 standard drinks    Types: 5 Glasses of wine per week  . Drug use: No     Allergies   Amoxicillin and Prednisone   Review of Systems Review of Systems   Physical Exam Triage Vital Signs ED Triage Vitals  Enc Vitals Group     BP  07/28/20 1549 120/73     Pulse Rate 07/28/20 1549 76     Resp 07/28/20 1549 16     Temp 07/28/20 1549 98.7 F (37.1 C)     Temp src --      SpO2 07/28/20 1549 99 %     Weight --      Height --      Head Circumference --      Peak Flow --      Pain Score 07/28/20 1548 10     Pain Loc --      Pain Edu? --      Excl. in GC? --    No data found.  Updated Vital Signs BP 120/73 (BP Location: Right Arm)   Pulse 76   Temp 98.7 F (37.1 C)   Resp 16   SpO2 99%   Visual Acuity Right Eye Distance:   Left Eye Distance:   Bilateral Distance:    Right Eye Near:   Left Eye Near:    Bilateral Near:     Physical Exam Vitals and nursing note reviewed. Exam conducted with a chaperone present.  Constitutional:      Appearance: Normal appearance.  Genitourinary:      Comments: Left Bartholin's gland with mild inflammation and enlargement compared to right, no active drainage.  Tenderness to palpation.  Does not feel fluctuant with a true appreciable pocket of purulence.  No skin lesions on the external labia Neurological:     Mental Status: She is alert.      UC Treatments / Results  Labs (all labs ordered are listed, but only abnormal results are displayed) Labs Reviewed - No data to display  EKG   Radiology No results found.  Procedures Procedures (including critical care time)  Medications Ordered in UC Medications  ketorolac (TORADOL) injection 60 mg (60 mg Intramuscular Given 07/28/20 1700)    Initial Impression / Assessment and Plan / UC Course  I have reviewed the triage vital signs and the nursing notes.  Pertinent labs & imaging results that were available during my care of the patient were reviewed by me and considered in my medical decision making (see chart for details).     #Bartholin's gland infection Patient is a 29 year old presenting with appears to be early Bartholin gland infection.  Does not appear drainable today, will  start on Bactrim and  metronidazole per up-to-date recommendations.  Sitz bath's also recommended.  Toradol injection given in clinic.  Naproxen outpatient.  Discussed with patient if not improving over the next several days that she would need to return for reevaluation.  Also encouraged her to follow-up and establish with OB/GYN, as if gland were to continue to enlarge, this can be best handled by an OB/GYN office.  Patient verbalized agreement and understanding plan of care. Final Clinical Impressions(s) / UC Diagnoses   Final diagnoses:  Bartholin's gland infection     Discharge Instructions     Take the antibiotics as prescribed Take naproxen as prescribed for the next 4 to 5 days  If you are not improving significantly over the next 2 to 3 days, follow-up with your OB/GYN or this clinic.  If you do not have an OB/GYN call the call center above     ED Prescriptions    Medication Sig Dispense Auth. Provider   sulfamethoxazole-trimethoprim (BACTRIM DS) 800-160 MG tablet Take 1 tablet by mouth 2 (two) times daily for 7 days. 14 tablet Sriya Kroeze, Veryl Speak, PA-C   metroNIDAZOLE (FLAGYL) 500 MG tablet Take 1 tablet (500 mg total) by mouth 2 (two) times daily. 14 tablet Marbella Markgraf, Veryl Speak, PA-C   naproxen (NAPROSYN) 500 MG tablet Take 1 tablet (500 mg total) by mouth 2 (two) times daily. 30 tablet Sible Straley, Veryl Speak, PA-C     PDMP not reviewed this encounter.   Hermelinda Medicus, PA-C 07/28/20 2343

## 2020-07-29 ENCOUNTER — Other Ambulatory Visit: Payer: Self-pay

## 2020-07-29 ENCOUNTER — Other Ambulatory Visit: Payer: Self-pay | Admitting: Family Medicine

## 2020-07-29 ENCOUNTER — Ambulatory Visit (HOSPITAL_COMMUNITY)
Admission: EM | Admit: 2020-07-29 | Discharge: 2020-07-29 | Disposition: A | Discharge status: Home / Self Care | Payer: Self-pay | Attending: Family Medicine | Admitting: Family Medicine

## 2020-07-29 ENCOUNTER — Encounter (HOSPITAL_COMMUNITY): Payer: Self-pay | Admitting: *Deleted

## 2020-07-29 DIAGNOSIS — N9489 Other specified conditions associated with female genital organs and menstrual cycle: Secondary | ICD-10-CM

## 2020-07-29 MED ORDER — KETOROLAC TROMETHAMINE 60 MG/2ML IM SOLN
60.0000 mg | Freq: Once | INTRAMUSCULAR | Status: AC
  Administered 2020-07-29: 60 mg via INTRAMUSCULAR

## 2020-07-29 MED ORDER — LIDOCAINE 0.5 % EX GEL: 1.0000 "application " | CUTANEOUS | 0 refills | Status: DC | PRN

## 2020-07-29 MED ORDER — KETOROLAC TROMETHAMINE 60 MG/2ML IM SOLN
INTRAMUSCULAR | Status: AC
  Filled 2020-07-29: qty 2

## 2020-07-29 NOTE — ED Provider Notes (Signed)
[facility]    CSN: [id number] Arrival date & time: 07/29/20  4098      History   Chief Complaint Chief Complaint  Patient presents with  . Recurrent Skin Infections    HPI Victoria Wright is a 29 y.o. female.   Patient presenting today following up on labial pain from a suspected inflamed bartholin cyst evaluated here in the UC yesterday. She was given bactrim and flagyl and IM toradol, which did help the pain temporarily. She presents today requesting another shot of toradol given her pain particularly with walking as she has to work all day today on her feet. Denies fever, chills, worsening redness, drainage, sweats. Using tylenol OTC and doing warm sitz baths.      Past Medical History:  Diagnosis Date  . Abnormal Pap smear    follow up was wnl  . Acne   . Anxiety   . Depression   . Hypotension    taken out of work r/t bp drops  . UTI (lower urinary tract infection)   . Vaginal Pap smear, abnormal     Patient Active Problem List   Diagnosis Date Noted  . Current smoker 01/09/2018    Past Surgical History:  Procedure Laterality Date  . WISDOM TOOTH EXTRACTION      OB History    Gravida  6   Para  3   Term  3   Preterm      AB  3   Living  3     SAB  2   TAB  1   Ectopic      Multiple  0   Live Births  3            Home Medications    Prior to Admission medications   Medication Sig Start Date End Date Taking? Authorizing Provider  metroNIDAZOLE (FLAGYL) 500 MG tablet Take 1 tablet (500 mg total) by mouth 2 (two) times daily. 07/28/20  Yes Darr, Veryl Speak, PA-C  naproxen (NAPROSYN) 500 MG tablet Take 1 tablet (500 mg total) by mouth 2 (two) times daily. 07/28/20  Yes Darr, Veryl Speak, PA-C  sulfamethoxazole-trimethoprim (BACTRIM DS) 800-160 MG tablet Take 1 tablet by mouth 2 (two) times daily for 7 days. 07/28/20 08/04/20 Yes Darr, Veryl Speak, PA-C  albuterol (PROVENTIL HFA;VENTOLIN HFA) 108 (90 Base) MCG/ACT inhaler Inhale 2  puffs into the lungs every 6 (six) hours as needed for wheezing or shortness of breath. 08/02/17   Adam Phenix, MD  buPROPion HCl (WELLBUTRIN PO) Take by mouth.    [provider]  busPIRone (BUSPAR) 10 MG tablet Take 10 mg by mouth 3 (three) times daily.    [provider]  Cetirizine HCl 10 MG CAPS Take 1 capsule (10 mg total) by mouth daily. 02/03/20   Wieters, Hallie C, PA-C  cyclobenzaprine (FLEXERIL) 10 MG tablet Take 1 tablet (10 mg total) by mouth 2 (two) times daily as needed for muscle spasms. 08/04/19   Mickie Bail, NP  dextromethorphan-guaiFENesin [facility] DM) 30-600 MG 12hr tablet Take 1 tablet by mouth 2 (two) times daily. 02/03/20   Wieters, Hallie C, PA-C  escitalopram (LEXAPRO) 10 MG tablet Take 10 mg by mouth daily.    [provider]  etonogestrel-ethinyl estradiol (NUVARING) 0.12-0.015 MG/24HR vaginal ring Insert vaginally and leave in place for 3 consecutive weeks, then remove for 1 week. 01/09/18   Adam Phenix, MD  ibuprofen (ADVIL) 800 MG tablet Take 1 tablet (800  mg total) by mouth every 8 (eight) hours as needed. 08/04/19   Mickie Bail, NP  Lidocaine 0.5 % GEL Apply 1 application topically as needed. 07/29/20   Particia Nearing, PA-C  triamcinolone cream (KENALOG) 0.1 % Apply 1 application topically 2 (two) times daily. 07/26/20   Moshe Cipro, NP    Family History Family History  Problem Relation Age of Onset  . Hypertension Mother   . Diabetes Maternal Grandmother   . Cancer Maternal Grandfather   . Anesthesia problems Neg Hx   . Hypotension Neg Hx   . Pseudochol deficiency Neg Hx   . Malignant hyperthermia Neg Hx     Social History Social History   Tobacco Use  . Smoking status: Current Every Day Smoker    Packs/day: 0.25    Types: Cigarettes  . Smokeless tobacco: Never Used  Vaping Use  . Vaping Use: Never used  Substance Use Topics  . Alcohol use: Not Currently  . Drug use: No     Allergies     Amoxicillin and Prednisone   Review of Systems Review of Systems PER HPI    Physical Exam Triage Vital Signs ED Triage Vitals  Enc Vitals Group     BP 07/29/20 0903 (!) 159/110     Pulse --      Resp 07/29/20 0903 18     Temp 07/29/20 0903 99.1 F (37.3 C)     Temp src --      SpO2 07/29/20 0903 99 %     Weight --      Height --      Head Circumference --      Peak Flow --      Pain Score 07/29/20 0904 9     Pain Loc --      Pain Edu? --      Excl. in GC? --    No data found.  Updated Vital Signs BP (!) 159/110 (BP Location: Right Arm)   Temp 99.1 F (37.3 C)   Resp 18   LMP 07/07/2020   SpO2 99%   Visual Acuity Right Eye Distance:   Left Eye Distance:   Bilateral Distance:    Right Eye Near:   Left Eye Near:    Bilateral Near:     Physical Exam Vitals and nursing note reviewed.  Constitutional:      Appearance: Normal appearance. She is not ill-appearing.  HENT:     Head: Atraumatic.  Eyes:     Extraocular Movements: Extraocular movements intact.     Conjunctiva/sclera: Conjunctivae normal.  Cardiovascular:     Rate and Rhythm: Normal rate and regular rhythm.     Heart sounds: Normal heart sounds.  Pulmonary:     Effort: Pulmonary effort is normal.     Breath sounds: Normal breath sounds.  Genitourinary:    Comments: Patient declines GU exam today Musculoskeletal:        General: Normal range of motion.     Cervical back: Normal range of motion and neck supple.  Skin:    General: Skin is warm and dry.  Neurological:     Mental Status: She is alert and oriented to person, place, and time.  Psychiatric:        Mood and Affect: Mood normal.        Thought Content: Thought content normal.        Judgment: Judgment normal.      UC Treatments / Results  Labs (all labs  ordered are listed, but only abnormal results are displayed) Labs Reviewed - No data to display  EKG   Radiology No results found.  Procedures Procedures  (including critical care time)  Medications Ordered in UC Medications  ketorolac (TORADOL) injection 60 mg (60 mg Intramuscular Given 07/29/20 0943)    Initial Impression / Assessment and Plan / UC Course  I have reviewed the triage vital signs and the nursing notes.  Pertinent labs & imaging results that were available during my care of the patient were reviewed by me and considered in my medical decision making (see chart for details).     Patient declines re-examination of the area today so unclear if area is worsening or improving though per patient there has not been much change since yesterday's examination. Complete full course of antibiotics as prescribed, continue tylenol prn, Toradol IM given today as well as lidocaine gel for topical pain relief prn. F/u with GYN if not resolving   Final Clinical Impressions(s) / UC Diagnoses   Final diagnoses:  Labial pain   Discharge Instructions   None    ED Prescriptions    Medication Sig Dispense Auth. Provider   Lidocaine 0.5 % GEL Apply 1 application topically as needed. 170 g Particia Nearing, [place]     PDMP not reviewed this encounter.   Roosvelt Maser [place], [place] 07/29/20 [phone]

## 2020-07-29 NOTE — ED Triage Notes (Signed)
Pt reports a on going skin infection to external Labia . Pt has been seen on 9-19 and 9-21. Pt reports pain with walking . Pt here today for pain med so she can return to work. Pain 9/10.

## 2020-08-04 ENCOUNTER — Encounter (HOSPITAL_COMMUNITY): Payer: Self-pay

## 2020-08-04 ENCOUNTER — Ambulatory Visit (HOSPITAL_COMMUNITY)
Admission: EM | Admit: 2020-08-04 | Discharge: 2020-08-04 | Disposition: A | Discharge status: Home / Self Care | Payer: HRSA Program | Attending: Urgent Care | Admitting: Urgent Care

## 2020-08-04 ENCOUNTER — Other Ambulatory Visit: Payer: Self-pay

## 2020-08-04 DIAGNOSIS — F17210 Nicotine dependence, cigarettes, uncomplicated: Secondary | ICD-10-CM | POA: Diagnosis not present

## 2020-08-04 DIAGNOSIS — J069 Acute upper respiratory infection, unspecified: Secondary | ICD-10-CM | POA: Diagnosis not present

## 2020-08-04 DIAGNOSIS — Z791 Long term (current) use of non-steroidal anti-inflammatories (NSAID): Secondary | ICD-10-CM | POA: Diagnosis not present

## 2020-08-04 DIAGNOSIS — Z20822 Contact with and (suspected) exposure to covid-19: Secondary | ICD-10-CM | POA: Diagnosis not present

## 2020-08-04 DIAGNOSIS — Z79899 Other long term (current) drug therapy: Secondary | ICD-10-CM | POA: Diagnosis not present

## 2020-08-04 DIAGNOSIS — Z793 Long term (current) use of hormonal contraceptives: Secondary | ICD-10-CM | POA: Insufficient documentation

## 2020-08-04 DIAGNOSIS — F329 Major depressive disorder, single episode, unspecified: Secondary | ICD-10-CM | POA: Insufficient documentation

## 2020-08-04 DIAGNOSIS — F419 Anxiety disorder, unspecified: Secondary | ICD-10-CM | POA: Insufficient documentation

## 2020-08-04 NOTE — Discharge Instructions (Addendum)
Your COVID test is pending.  You should self quarantine until the test result is back.    Take Tylenol as needed for fever or discomfort; Mucinex as needed for congestion.  Rest and keep yourself hydrated.    Go to the emergency department if you develop acute worsening symptoms.     

## 2020-08-04 NOTE — ED Provider Notes (Signed)
[facility]    CSN: [id number] Arrival date & time: 08/04/20  1732      History   Chief Complaint Chief Complaint  Patient presents with  . Cough    HPI Victoria Wright is a 29 y.o. female.   Patient presents with cough productive of green mucus x4 to 5 days.  She denies fever, chills, sore throat, shortness of breath, vomiting, diarrhea, or other symptoms.  No treatment attempted at home.  Patient was seen here on 07/26/2020, 07/28/2020, and 07/29/2020; diagnosed with vaginal irritation, Bartholin's gland infection, labial pain; treated with Bactrim and metronidazole on 07/28/2020.  The history is provided by the patient.    Past Medical History:  Diagnosis Date  . Abnormal Pap smear    follow up was wnl  . Acne   . Anxiety   . Depression   . Hypotension    taken out of work r/t bp drops  . UTI (lower urinary tract infection)   . Vaginal Pap smear, abnormal     Patient Active Problem List   Diagnosis Date Noted  . Current smoker 01/09/2018    Past Surgical History:  Procedure Laterality Date  . WISDOM TOOTH EXTRACTION      OB History    Gravida  6   Para  3   Term  3   Preterm      AB  3   Living  3     SAB  2   TAB  1   Ectopic      Multiple  0   Live Births  3            Home Medications    Prior to Admission medications   Medication Sig Start Date End Date Taking? Authorizing Provider  albuterol (PROVENTIL HFA;VENTOLIN HFA) 108 (90 Base) MCG/ACT inhaler Inhale 2 puffs into the lungs every 6 (six) hours as needed for wheezing or shortness of breath. 08/02/17   Adam Phenix, MD  buPROPion HCl (WELLBUTRIN PO) Take by mouth.    [provider]  busPIRone (BUSPAR) 10 MG tablet Take 10 mg by mouth 3 (three) times daily.    [provider]  Cetirizine HCl 10 MG CAPS Take 1 capsule (10 mg total) by mouth daily. 02/03/20   Wieters, Hallie C, PA-C  cyclobenzaprine (FLEXERIL) 10 MG tablet Take 1 tablet (10 mg  total) by mouth 2 (two) times daily as needed for muscle spasms. 08/04/19   Mickie Bail, NP  dextromethorphan-guaiFENesin [facility] DM) 30-600 MG 12hr tablet Take 1 tablet by mouth 2 (two) times daily. 02/03/20   Wieters, Hallie C, PA-C  escitalopram (LEXAPRO) 10 MG tablet Take 10 mg by mouth daily.    [provider]  etonogestrel-ethinyl estradiol (NUVARING) 0.12-0.015 MG/24HR vaginal ring Insert vaginally and leave in place for 3 consecutive weeks, then remove for 1 week. 01/09/18   Adam Phenix, MD  ibuprofen (ADVIL) 800 MG tablet Take 1 tablet (800 mg total) by mouth every 8 (eight) hours as needed. 08/04/19   Mickie Bail, NP  Lidocaine 0.5 % GEL Apply 1 application topically as needed. 07/29/20   Particia Nearing, PA-C  metroNIDAZOLE (FLAGYL) 500 MG tablet Take 1 tablet (500 mg total) by mouth 2 (two) times daily. 07/28/20   Darr, Veryl Speak, PA-C  naproxen (NAPROSYN) 500 MG tablet Take 1 tablet (500 mg total) by mouth 2 (two) times daily. 07/28/20   Darr, Veryl Speak, PA-C  sulfamethoxazole-trimethoprim (BACTRIM  DS) 800-160 MG tablet Take 1 tablet by mouth 2 (two) times daily for 7 days. 07/28/20 08/04/20  Darr, Veryl Speak, PA-C  triamcinolone cream (KENALOG) 0.1 % Apply 1 application topically 2 (two) times daily. 07/26/20   Moshe Cipro, NP    Family History Family History  Problem Relation Age of Onset  . Hypertension Mother   . Diabetes Maternal Grandmother   . Cancer Maternal Grandfather   . Anesthesia problems Neg Hx   . Hypotension Neg Hx   . Pseudochol deficiency Neg Hx   . Malignant hyperthermia Neg Hx     Social History Social History   Tobacco Use  . Smoking status: Current Every Day Smoker    Packs/day: 0.25    Types: Cigarettes  . Smokeless tobacco: Never Used  Vaping Use  . Vaping Use: Never used  Substance Use Topics  . Alcohol use: Not Currently  . Drug use: No     Allergies   Amoxicillin and Prednisone   Review of Systems Review of Systems    Constitutional: Negative for chills and fever.  HENT: Negative for ear pain and sore throat.   Eyes: Negative for pain and visual disturbance.  Respiratory: Positive for cough. Negative for shortness of breath.   Cardiovascular: Negative for chest pain and palpitations.  Gastrointestinal: Negative for abdominal pain, diarrhea and vomiting.  Genitourinary: Negative for dysuria and hematuria.  Musculoskeletal: Negative for arthralgias and back pain.  Skin: Negative for color change and rash.  Neurological: Negative for seizures and syncope.  All other systems reviewed and are negative.    Physical Exam Triage Vital Signs ED Triage Vitals  Enc Vitals Group     BP      Pulse      Resp      Temp      Temp src      SpO2      Weight      Height      Head Circumference      Peak Flow      Pain Score      Pain Loc      Pain Edu?      Excl. in GC?    No data found.  Updated Vital Signs BP 122/76   Pulse 90   Temp 98.3 F (36.8 C)   Resp 19   LMP 07/07/2020   SpO2 99%   Visual Acuity Right Eye Distance:   Left Eye Distance:   Bilateral Distance:    Right Eye Near:   Left Eye Near:    Bilateral Near:     Physical Exam Vitals and nursing note reviewed.  Constitutional:      General: She is not in acute distress.    Appearance: She is well-developed. She is not ill-appearing.  HENT:     Head: Normocephalic and atraumatic.     Right Ear: Tympanic membrane normal.     Left Ear: Tympanic membrane normal.     Nose: Nose normal.     Mouth/Throat:     Mouth: Mucous membranes are moist.     Pharynx: Oropharynx is clear.  Eyes:     Conjunctiva/sclera: Conjunctivae normal.  Cardiovascular:     Rate and Rhythm: Normal rate and regular rhythm.     Heart sounds: No murmur heard.   Pulmonary:     Effort: Pulmonary effort is normal. No respiratory distress.     Breath sounds: Normal breath sounds.  Abdominal:     Palpations: Abdomen  is soft.     Tenderness: There is  no abdominal tenderness.  Musculoskeletal:     Cervical back: Neck supple.  Skin:    General: Skin is warm and dry.     Findings: No rash.  Neurological:     General: No focal deficit present.     Mental Status: She is alert and oriented to person, place, and time.     Gait: Gait normal.  Psychiatric:        Mood and Affect: Mood normal.        Behavior: Behavior normal.      UC Treatments / Results  Labs (all labs ordered are listed, but only abnormal results are displayed) Labs Reviewed  SARS CORONAVIRUS 2 (TAT 6-24 HRS)    EKG   Radiology No results found.  Procedures Procedures (including critical care time)  Medications Ordered in UC Medications - No data to display  Initial Impression / Assessment and Plan / UC Course  I have reviewed the triage vital signs and the nursing notes.  Pertinent labs & imaging results that were available during my care of the patient were reviewed by me and considered in my medical decision making (see chart for details).   Viral URI with cough.  PCR COVID pending.  Instructed patient to self quarantine until the test result is back.  Discussed symptomatic treatment including Tylenol, rest, hydration.  Instructed patient to go to the ED if she has acute worsening symptoms.  Patient agrees to plan of care.    Final Clinical Impressions(s) / UC Diagnoses   Final diagnoses:  Viral URI with cough     Discharge Instructions     Your COVID test is pending.  You should self quarantine until the test result is back.    Take Tylenol as needed for fever or discomfort; Mucinex as needed for congestion.  Rest and keep yourself hydrated.    Go to the emergency department if you develop acute worsening symptoms.        ED Prescriptions    None     PDMP not reviewed this encounter.   Mickie Bail, NP 08/04/20 2101

## 2020-08-04 NOTE — ED Triage Notes (Signed)
Pt presents with complaints of cough since Friday. Reports cough is productive with green thick mucus.

## 2020-08-05 LAB — SARS CORONAVIRUS 2 (TAT 6-24 HRS): SARS Coronavirus 2: NEGATIVE

## 2021-01-27 ENCOUNTER — Encounter (HOSPITAL_COMMUNITY): Payer: Self-pay

## 2021-01-27 ENCOUNTER — Ambulatory Visit (HOSPITAL_COMMUNITY)
Admission: EM | Admit: 2021-01-27 | Discharge: 2021-01-27 | Disposition: A | Discharge status: Home / Self Care | Payer: No Typology Code available for payment source

## 2021-01-27 ENCOUNTER — Other Ambulatory Visit: Payer: Self-pay

## 2021-01-27 DIAGNOSIS — H029 Unspecified disorder of eyelid: Secondary | ICD-10-CM

## 2021-01-27 DIAGNOSIS — H539 Unspecified visual disturbance: Secondary | ICD-10-CM | POA: Diagnosis not present

## 2021-01-27 DIAGNOSIS — Q309 Congenital malformation of nose, unspecified: Secondary | ICD-10-CM

## 2021-01-27 NOTE — Discharge Instructions (Signed)
Apply small amount of Aquaphor to nasal cavity. Trial eye allergy drops (cetrizine) or Ultra lubrication eye gel drops

## 2021-01-27 NOTE — ED Provider Notes (Signed)
[facility]    CSN: [id number] Arrival date & time: 01/27/21  1506      History   Chief Complaint Chief Complaint  Patient presents with  . Eye Problem  . Facial Swelling    nose    HPI Victoria Wright is a 30 y.o. female.   HPI  Nasal concern: Pt states that about 3 months ago she had an itch in her nose in the right hand side. She noticed thicker than normal mucous. Then slowly over time this area has become green and scab like. No bleeding, pain, or nasal congestion. Then 1 month ago she noticed that her eyelids appear puffy and a bit pink. Now she has some occasional blurry vision which improves with rubbing her eyes. No abnormal eye discharge, eye pain or eye injury. No new medications, products, etc.    Past Medical History:  Diagnosis Date  . Abnormal Pap smear    follow up was wnl  . Acne   . Anxiety   . Depression   . Hypotension    taken out of work r/t bp drops  . UTI (lower urinary tract infection)   . Vaginal Pap smear, abnormal     Patient Active Problem List   Diagnosis Date Noted  . Current smoker 01/09/2018    Past Surgical History:  Procedure Laterality Date  . WISDOM TOOTH EXTRACTION      OB History    Gravida  6   Para  3   Term  3   Preterm      AB  3   Living  3     SAB  2   IAB  1   Ectopic      Multiple  0   Live Births  3            Home Medications    Prior to Admission medications   Medication Sig Start Date End Date Taking? Authorizing Provider  albuterol (PROVENTIL HFA;VENTOLIN HFA) 108 (90 Base) MCG/ACT inhaler Inhale 2 puffs into the lungs every 6 (six) hours as needed for wheezing or shortness of breath. 08/02/17   Adam Phenix, MD  buPROPion HCl (WELLBUTRIN PO) Take by mouth.    [provider]  busPIRone (BUSPAR) 10 MG tablet Take 10 mg by mouth 3 (three) times daily.    [provider]  Cetirizine HCl 10 MG CAPS Take 1 capsule (10 mg total) by mouth daily.  02/03/20   Wieters, Hallie C, PA-C  cyclobenzaprine (FLEXERIL) 10 MG tablet Take 1 tablet (10 mg total) by mouth 2 (two) times daily as needed for muscle spasms. 08/04/19   Mickie Bail, NP  dextromethorphan-guaiFENesin [facility] DM) 30-600 MG 12hr tablet Take 1 tablet by mouth 2 (two) times daily. 02/03/20   Wieters, Hallie C, PA-C  escitalopram (LEXAPRO) 10 MG tablet Take 10 mg by mouth daily.    [provider]  etonogestrel-ethinyl estradiol (NUVARING) 0.12-0.015 MG/24HR vaginal ring Insert vaginally and leave in place for 3 consecutive weeks, then remove for 1 week. 01/09/18   Adam Phenix, MD  ibuprofen (ADVIL) 800 MG tablet Take 1 tablet (800 mg total) by mouth every 8 (eight) hours as needed. 08/04/19   Mickie Bail, NP  Lidocaine 0.5 % GEL Apply 1 application topically as needed. 07/29/20   Particia Nearing, PA-C  metroNIDAZOLE (FLAGYL) 500 MG tablet Take 1 tablet (500 mg total) by mouth 2 (two) times daily. 07/28/20   Darr,  Gerilyn Pilgrim, PA-C  naproxen (NAPROSYN) 500 MG tablet Take 1 tablet (500 mg total) by mouth 2 (two) times daily. 07/28/20   Darr, Gerilyn Pilgrim, PA-C  triamcinolone cream (KENALOG) 0.1 % Apply 1 application topically 2 (two) times daily. 07/26/20   Moshe Cipro, NP    Family History Family History  Problem Relation Age of Onset  . Hypertension Mother   . Diabetes Maternal Grandmother   . Cancer Maternal Grandfather   . Anesthesia problems Neg Hx   . Hypotension Neg Hx   . Pseudochol deficiency Neg Hx   . Malignant hyperthermia Neg Hx     Social History Social History   Tobacco Use  . Smoking status: Current Every Day Smoker    Packs/day: 0.25    Types: Cigarettes  . Smokeless tobacco: Never Used  Vaping Use  . Vaping Use: Never used  Substance Use Topics  . Alcohol use: Not Currently  . Drug use: No     Allergies   Amoxicillin and Prednisone   Review of Systems Review of Systems  As stated above in HPI Physical Exam Triage Vital  Signs ED Triage Vitals  Enc Vitals Group     BP 01/27/21 1534 128/69     Pulse Rate 01/27/21 1534 94     Resp 01/27/21 1534 18     Temp 01/27/21 1534 99.4 F (37.4 C)     Temp Source 01/27/21 1534 Oral     SpO2 01/27/21 1534 99 %     Weight --      Height --      Head Circumference --      Peak Flow --      Pain Score 01/27/21 1535 0     Pain Loc --      Pain Edu? --      Excl. in GC? --    No data found.  Updated Vital Signs BP 128/69 (BP Location: Left Arm)   Pulse 94   Temp 99.4 F (37.4 C) (Oral)   Resp 18   SpO2 99%   Physical Exam Vitals and nursing note reviewed.  Constitutional:      General: She is not in acute distress.    Appearance: Normal appearance. She is not ill-appearing, toxic-appearing or diaphoretic.  HENT:     Head: Normocephalic and atraumatic.     Right Ear: Tympanic membrane, ear canal and external ear normal.     Left Ear: Tympanic membrane, ear canal and external ear normal.     Nose: Nose normal.     Comments: There are two roughly 88mm yellow/green scab like lesions of the right septum with some leukoplakia of bilateral septum. No bleeding.     Mouth/Throat:     Mouth: Mucous membranes are dry.  Eyes:     Extraocular Movements: Extraocular movements intact.     Conjunctiva/sclera: Conjunctivae normal.     Pupils: Pupils are equal, round, and reactive to light.     Comments: Mild puffiness and erythema of bilateral eye lids.   Cardiovascular:     Rate and Rhythm: Normal rate and regular rhythm.     Heart sounds: Normal heart sounds.  Pulmonary:     Effort: Pulmonary effort is normal.     Breath sounds: Normal breath sounds.  Musculoskeletal:     Cervical back: Normal range of motion and neck supple.  Neurological:     Mental Status: She is alert.      UC Treatments / Results  Labs (all labs  ordered are listed, but only abnormal results are displayed) Labs Reviewed - No data to display  EKG   Radiology No results  found.  Procedures Procedures (including critical care time)  Medications Ordered in UC Medications - No data to display  Initial Impression / Assessment and Plan / UC Course  I have reviewed the triage vital signs and the nursing notes.  Pertinent labs & imaging results that were available during my care of the patient were reviewed by me and considered in my medical decision making (see chart for details).     New. Treating with Aquaphor application x 2 weeks until she can get in with ENT for further evaluation and potential biopsy. In terms of her eyes I have recommended that she take a claritin or zyrtec orally once daily and use Ultra Lubricated eye drops until she can be seen by an eye specialist given the dry eyes and blurry vision that improves with rubbing the eyes. Discussed red flag signs and symptoms.  Final Clinical Impressions(s) / UC Diagnoses   Final diagnoses:  None   Discharge Instructions   None    ED Prescriptions    None     PDMP not reviewed this encounter.   Rushie Chestnut, [place] 01/27/21 1607

## 2021-01-27 NOTE — ED Triage Notes (Signed)
Pt states she has been having a nose problem x 3 months. She states 1 month ago, she started to have eye irritation and puffiness. Pt states she has had blurry vision on both eyes. She states she noticed her nose was swollen.

## 2021-03-30 ENCOUNTER — Encounter (HOSPITAL_COMMUNITY): Payer: Self-pay

## 2021-03-30 ENCOUNTER — Ambulatory Visit (HOSPITAL_COMMUNITY)
Admission: EM | Admit: 2021-03-30 | Discharge: 2021-03-30 | Disposition: A | Discharge status: Home / Self Care | Payer: No Typology Code available for payment source | Attending: Internal Medicine | Admitting: Internal Medicine

## 2021-03-30 ENCOUNTER — Other Ambulatory Visit: Payer: Self-pay

## 2021-03-30 DIAGNOSIS — F17210 Nicotine dependence, cigarettes, uncomplicated: Secondary | ICD-10-CM | POA: Diagnosis not present

## 2021-03-30 DIAGNOSIS — R509 Fever, unspecified: Secondary | ICD-10-CM

## 2021-03-30 DIAGNOSIS — J111 Influenza due to unidentified influenza virus with other respiratory manifestations: Secondary | ICD-10-CM

## 2021-03-30 DIAGNOSIS — J029 Acute pharyngitis, unspecified: Secondary | ICD-10-CM

## 2021-03-30 DIAGNOSIS — R52 Pain, unspecified: Secondary | ICD-10-CM | POA: Insufficient documentation

## 2021-03-30 DIAGNOSIS — U071 COVID-19: Secondary | ICD-10-CM | POA: Insufficient documentation

## 2021-03-30 DIAGNOSIS — R5383 Other fatigue: Secondary | ICD-10-CM

## 2021-03-30 DIAGNOSIS — Z79899 Other long term (current) drug therapy: Secondary | ICD-10-CM | POA: Diagnosis not present

## 2021-03-30 DIAGNOSIS — R059 Cough, unspecified: Secondary | ICD-10-CM | POA: Insufficient documentation

## 2021-03-30 DIAGNOSIS — R42 Dizziness and giddiness: Secondary | ICD-10-CM

## 2021-03-30 DIAGNOSIS — M791 Myalgia, unspecified site: Secondary | ICD-10-CM

## 2021-03-30 DIAGNOSIS — R519 Headache, unspecified: Secondary | ICD-10-CM

## 2021-03-30 DIAGNOSIS — R197 Diarrhea, unspecified: Secondary | ICD-10-CM

## 2021-03-30 LAB — SARS CORONAVIRUS 2 (TAT 6-24 HRS): SARS Coronavirus 2: POSITIVE — AB

## 2021-03-30 MED ORDER — ACETAMINOPHEN 325 MG PO TABS
650.0000 mg | ORAL_TABLET | Freq: Once | ORAL | Status: AC
  Administered 2021-03-30: 650 mg via ORAL

## 2021-03-30 MED ORDER — OSELTAMIVIR PHOSPHATE 75 MG PO CAPS: 75.0000 mg | ORAL_CAPSULE | Freq: Two times a day (BID) | ORAL | 0 refills | Status: AC

## 2021-03-30 MED ORDER — ACETAMINOPHEN 325 MG PO TABS
ORAL_TABLET | ORAL | Status: AC
  Filled 2021-03-30: qty 2

## 2021-03-30 NOTE — Discharge Instructions (Addendum)
Recommend start Tamiflu 75mg  twice a day as directed- if positive for COVID 19, may stop Tamiflu. Continue Tylenol 1000mg  every 8 hours as needed for fever and body aches. Rest. Stay at home. Continue to push fluids to stay well hydrated. May take OTC Delsym 2 teaspoons every 12 hours as needed for cough. Follow-up pending COVID 19 test results.

## 2021-03-30 NOTE — ED Triage Notes (Signed)
Pt c/o body pain, fever, headaches, sore throat, cough and dizziness X 2 days.

## 2021-03-30 NOTE — ED Provider Notes (Signed)
[facility]    CSN: [id number] Arrival date & time: 03/30/21  0840      History   Chief Complaint Chief Complaint  Patient presents with  . Fever  . Generalized Body Aches  . Sore Throat  . Dizziness  . Headache    HPI Victoria Wright is a 30 y.o. female.   30 year old female presents with body aches, cough and dizziness for the past 2 days. Also having a headache and burning sore throat and fever. Denies any nasal congestion, nausea or vomiting. Did have some loose stools/diarrhea. Her kids have been ill recently but recovering. No known exposure to COVID 19 but recently exposed to friends who had influenza. Works as a Engineer, civil (consulting) at [employer]. Have had flu vaccine in Fall and 2 doses of COVID 19 vaccine but no booster. Other chronic health issues include anxiety/depression and tobacco use. Currently on Buspar daily.   The history is provided by the patient.    Past Medical History:  Diagnosis Date  . Abnormal Pap smear    follow up was wnl  . Acne   . Anxiety   . Depression   . Hypotension    taken out of work r/t bp drops  . UTI (lower urinary tract infection)   . Vaginal Pap smear, abnormal     Patient Active Problem List   Diagnosis Date Noted  . Current smoker 01/09/2018    Past Surgical History:  Procedure Laterality Date  . WISDOM TOOTH EXTRACTION      OB History    Gravida  6   Para  3   Term  3   Preterm      AB  3   Living  3     SAB  2   IAB  1   Ectopic      Multiple  0   Live Births  3            Home Medications    Prior to Admission medications   Medication Sig Start Date End Date Taking? Authorizing Provider  oseltamivir (TAMIFLU) 75 MG capsule Take 1 capsule (75 mg total) by mouth every 12 (twelve) hours for 5 days. 03/30/21 04/04/21 Yes Mazey Mantell, Ali Lowe, NP  paragard intrauterine copper IUD IUD 1 each by Intrauterine route once.   Yes [provider]  busPIRone (BUSPAR) 10 MG tablet  Take 10 mg by mouth 3 (three) times daily.    [provider]  albuterol (PROVENTIL HFA;VENTOLIN HFA) 108 (90 Base) MCG/ACT inhaler Inhale 2 puffs into the lungs every 6 (six) hours as needed for wheezing or shortness of breath. 08/02/17 03/30/21  Adam Phenix, MD  buPROPion HCl (WELLBUTRIN PO) Take by mouth.  03/30/21  [provider]  Cetirizine HCl 10 MG CAPS Take 1 capsule (10 mg total) by mouth daily. 02/03/20 03/30/21  Wieters, Hallie C, PA-C  escitalopram (LEXAPRO) 10 MG tablet Take 10 mg by mouth daily.  03/30/21  [provider]  etonogestrel-ethinyl estradiol (NUVARING) 0.12-0.015 MG/24HR vaginal ring Insert vaginally and leave in place for 3 consecutive weeks, then remove for 1 week. 01/09/18 03/30/21  Adam Phenix, MD    Family History Family History  Problem Relation Age of Onset  . Hypertension Mother   . Diabetes Maternal Grandmother   . Cancer Maternal Grandfather   . Anesthesia problems Neg Hx   . Hypotension Neg Hx   . Pseudochol deficiency Neg Hx   .  Malignant hyperthermia Neg Hx     Social History Social History   Tobacco Use  . Smoking status: Current Every Day Smoker    Packs/day: 0.25    Types: Cigarettes  . Smokeless tobacco: Never Used  Vaping Use  . Vaping Use: Never used  Substance Use Topics  . Alcohol use: Not Currently  . Drug use: No     Allergies   Amoxicillin and Prednisone   Review of Systems Review of Systems  Constitutional: Positive for chills, fatigue and fever. Negative for activity change and diaphoresis.  HENT: Positive for sore throat. Negative for congestion, ear discharge, ear pain, facial swelling, mouth sores, nosebleeds, postnasal drip, rhinorrhea, sinus pressure, sinus pain and trouble swallowing.   Eyes: Negative for pain, discharge, redness and itching.  Respiratory: Positive for cough. Negative for chest tightness, shortness of breath and wheezing.   Gastrointestinal: Positive for diarrhea.  Negative for nausea and vomiting.  Musculoskeletal: Positive for arthralgias and myalgias. Negative for neck pain and neck stiffness.  Skin: Negative for color change and rash.  Allergic/Immunologic: Negative for environmental allergies, food allergies and immunocompromised state.  Neurological: Positive for dizziness and headaches. Negative for tremors, seizures, syncope, facial asymmetry, speech difficulty, weakness and numbness.  Hematological: Negative for adenopathy. Does not bruise/bleed easily.     Physical Exam Triage Vital Signs ED Triage Vitals  Enc Vitals Group     BP 03/30/21 0955 125/68     Pulse Rate 03/30/21 0955 (!) 109     Resp 03/30/21 0955 18     Temp 03/30/21 0955 (!) 101.7 F (38.7 C)     Temp Source 03/30/21 0955 Oral     SpO2 03/30/21 0955 99 %     Weight --      Height --      Head Circumference --      Peak Flow --      Pain Score 03/30/21 0954 9     Pain Loc --      Pain Edu? --      Excl. in GC? --    No data found.  Updated Vital Signs BP 125/68 (BP Location: Right Arm)   Pulse (!) 109   Temp (!) 101.7 F (38.7 C) (Oral)   Resp 18   SpO2 99%   Visual Acuity Right Eye Distance:   Left Eye Distance:   Bilateral Distance:    Right Eye Near:   Left Eye Near:    Bilateral Near:     Physical Exam Vitals and nursing note reviewed.  Constitutional:      General: She is awake. She is not in acute distress.    Appearance: She is well-developed. She is ill-appearing.     Comments: She is sitting on the exam table in no acute distress but appears tired and ill.   HENT:     Head: Normocephalic and atraumatic.     Right Ear: Hearing, tympanic membrane, ear canal and external ear normal.     Left Ear: Hearing, tympanic membrane, ear canal and external ear normal.     Nose: Nose normal. No congestion or rhinorrhea.     Right Sinus: No maxillary sinus tenderness or frontal sinus tenderness.     Left Sinus: No maxillary sinus tenderness or frontal  sinus tenderness.     Mouth/Throat:     Lips: Pink.     Mouth: Mucous membranes are moist.     Pharynx: Uvula midline. Posterior oropharyngeal erythema present. No pharyngeal swelling,  oropharyngeal exudate or uvula swelling.  Eyes:     Extraocular Movements: Extraocular movements intact.     Conjunctiva/sclera: Conjunctivae normal.  Cardiovascular:     Rate and Rhythm: Regular rhythm. Tachycardia present.     Heart sounds: Normal heart sounds. No murmur heard.   Pulmonary:     Effort: Pulmonary effort is normal. No respiratory distress.     Breath sounds: Normal breath sounds and air entry. No decreased air movement. No decreased breath sounds, wheezing, rhonchi or rales.  Musculoskeletal:     Cervical back: Normal range of motion and neck supple.  Lymphadenopathy:     Cervical: Cervical adenopathy present.     Right cervical: Superficial cervical adenopathy present.     Left cervical: Superficial cervical adenopathy present.  Skin:    General: Skin is warm and dry.     Capillary Refill: Capillary refill takes less than 2 seconds.     Findings: No rash.  Neurological:     General: No focal deficit present.     Mental Status: She is alert and oriented to person, place, and time.  Psychiatric:        Mood and Affect: Mood normal.        Behavior: Behavior normal. Behavior is cooperative.        Thought Content: Thought content normal.        Judgment: Judgment normal.      UC Treatments / Results  Labs (all labs ordered are listed, but only abnormal results are displayed) Labs Reviewed  SARS CORONAVIRUS 2 (TAT 6-24 HRS) - Abnormal; Notable for the following components:      Result Value   SARS Coronavirus 2 POSITIVE (*)    All other components within normal limits    EKG   Radiology No results found.  Procedures Procedures (including critical care time)  Medications Ordered in UC Medications  acetaminophen (TYLENOL) tablet 650 mg (650 mg Oral Given 03/30/21  0959)    Initial Impression / Assessment and Plan / UC Course  I have reviewed the triage vital signs and the nursing notes.  Pertinent labs & imaging results that were available during my care of the patient were reviewed by me and considered in my medical decision making (see chart for details).    Patient had not taken any medication this morning- gave Tylenol now for fever.  Patient desires flu testing but rapid acute test not available at this time at this Urgent Care. Discussed that she probably has a viral illness and possibly COVID 19. However, since she has been exposed to influenza and flu vaccine was over 8 months ago, will start treatment today for possible influenza and may stop if COVID 19 is positive.  May start Tamiflu 75mg  twice a day as directed. Continue Tylenol 1000mg  every 8 hours as needed for fever and body aches. Rest. Stay at home. Continue to push fluids to stay well hydrated. May take OTC Delsym 2 teaspoons every 12 hours as needed for cough. Note written for work. Follow-up pending COVID 19 test results.   Final Clinical Impressions(s) / UC Diagnoses   Final diagnoses:  Influenza-like illness  Cough  Generalized body aches     Discharge Instructions     Recommend start Tamiflu 75mg  twice a day as directed- if positive for COVID 19, may stop Tamiflu. Continue Tylenol 1000mg  every 8 hours as needed for fever and body aches. Rest. Stay at home. Continue to push fluids to stay well hydrated. May take  OTC Delsym 2 teaspoons every 12 hours as needed for cough. Follow-up pending COVID 19 test results.     ED Prescriptions    Medication Sig Dispense Auth. Provider   oseltamivir (TAMIFLU) 75 MG capsule Take 1 capsule (75 mg total) by mouth every 12 (twelve) hours for 5 days. 10 capsule Sarahanne Novakowski, Ali LoweAnn Berry, NP     PDMP not reviewed this encounter.   Sudie GrumblingAmyot, Sarahmarie Leavey Berry, NP 03/31/21 [phone]

## 2021-03-31 ENCOUNTER — Telehealth: Payer: Self-pay | Admitting: Unknown Physician Specialty

## 2021-03-31 MED ORDER — NIRMATRELVIR/RITONAVIR (PAXLOVID)TABLET: 3.0000 | ORAL_TABLET | Freq: Two times a day (BID) | ORAL | 0 refills | Status: AC

## 2021-03-31 NOTE — Telephone Encounter (Signed)
Outpatient Oral COVID Treatment Note  I connected with Victoria Wright on 03/31/2021/4:35 PM by telephone and verified that I am speaking with the correct person using two identifiers.  I discussed the limitations, risks, security, and privacy concerns of performing an evaluation and management service by telephone and the availability of in person appointments. I also discussed with the patient that there may be a patient responsible charge related to this service. The patient expressed understanding and agreed to proceed.  Patient location: home Provider location: home  Diagnosis: COVID-19 infection  Purpose of visit: Discussion of potential use of Molnupiravir or Paxlovid, a new treatment for mild to moderate COVID-19 viral infection in non-hospitalized patients.   Subjective: Patient is a 30 y.o. female who has been diagnosed with COVID 19 viral infection.  Their symptoms began on 5/23 with fatigue, headache, fever.    Past Medical History:  Diagnosis Date  . Abnormal Pap smear    follow up was wnl  . Acne   . Anxiety   . Depression   . Hypotension    taken out of work r/t bp drops  . UTI (lower urinary tract infection)   . Vaginal Pap smear, abnormal     Allergies  Allergen Reactions  . Amoxicillin Rash and Other (See Comments)    Has patient had a PCN reaction causing immediate rash, facial/tongue/throat swelling, SOB or lightheadedness with hypotension: No Has patient had a PCN reaction causing severe rash involving mucus membranes or skin necrosis: No Has patient had a PCN reaction that required hospitalization No Has patient had a PCN reaction occurring within the last 10 years: No If all of the above answers are "NO", then may proceed with Cephalosporin use.  . Prednisone Rash     Current Outpatient Medications:  .  busPIRone (BUSPAR) 10 MG tablet, Take 10 mg by mouth 3 (three) times daily., Disp: , Rfl:  .  oseltamivir (TAMIFLU) 75 MG capsule, Take 1 capsule  (75 mg total) by mouth every 12 (twelve) hours for 5 days., Disp: 10 capsule, Rfl: 0 .  paragard intrauterine copper IUD IUD, 1 each by Intrauterine route once., Disp: , Rfl:   Objective: Patient appears/sounds congested.  They are in no apparent distress.  Breathing is non labored.  Mood and behavior are normal.  Laboratory Data:  Recent Results (from the past 2160 hour(s))  SARS CORONAVIRUS 2 (TAT 6-24 HRS) Nasopharyngeal Nasopharyngeal Swab     Status: Abnormal   Collection Time: 03/30/21 11:26 AM   Specimen: Nasopharyngeal Swab  Result Value Ref Range   SARS Coronavirus 2 POSITIVE (A) NEGATIVE    Comment: (NOTE) SARS-CoV-2 target nucleic acids are DETECTED.  The SARS-CoV-2 RNA is generally detectable in upper and lower respiratory specimens during the acute phase of infection. Positive results are indicative of the presence of SARS-CoV-2 RNA. Clinical correlation with patient history and other diagnostic information is  necessary to determine patient infection status. Positive results do not rule out bacterial infection or co-infection with other viruses.  The expected result is Negative.  Fact Sheet for Patients: HairSlick.no  Fact Sheet for Healthcare Providers: quierodirigir.com  This test is not yet approved or cleared by the [place] FDA and  has been authorized for detection and/or diagnosis of SARS-CoV-2 by FDA under an Emergency Use Authorization (EUA). This EUA will remain  in effect (meaning this test can be used) for the duration of the COVID-19 declaration under Section 564(b)(1) of the Act, 21 U. S.C. section 360bbb-3(b)(1),  unless the authorization is terminated or revoked sooner.   Performed at [facility], [address], [place], [place] [address]      Assessment: 29 y.o. female with mild/moderate COVID 19 viral infection diagnosed on 5/23 at high risk for progression to severe COVID  19.  Plan:  This patient is a 30 y.o. female that meets the following criteria for Emergency Use Authorization of: Paxlovid 1. Age >12 yr AND > 40 kg 2. SARS-COV-2 positive test 3. Symptom onset < 5 days 4. Mild-to-moderate COVID disease with high risk for severe progression to hospitalization or death  I have spoken and communicated the following to the patient or parent/caregiver regarding: 1. Paxlovid is an unapproved drug that is authorized for use under an Emergency Use Authorization.  2. There are no adequate, approved, available products for the treatment of COVID-19 in adults who have mild-to-moderate COVID-19 and are at high risk for progressing to severe COVID-19, including hospitalization or death. 3. Other therapeutics are currently authorized. For additional information on all products authorized for treatment or prevention of COVID-19, please see https://www.graham-miller.com/.  4. There are benefits and risks of taking this treatment as outlined in the "Fact Sheet for Patients and Caregivers."  5. "Fact Sheet for Patients and Caregivers" was reviewed with patient. A hard copy will be provided to patient from pharmacy prior to the patient receiving treatment. 6. Patients should continue to self-isolate and use infection control measures (e.g., wear mask, isolate, social distance, avoid sharing personal items, clean and disinfect "high touch" surfaces, and frequent handwashing) according to CDC guidelines.  7. The patient or parent/caregiver has the option to accept or refuse treatment. 8. Patient medication history was reviewed for potential drug interactions:No drug interactions 9. Patient's GFR was calculated to be >60, and they were therefore prescribed Normal dose (GFR>60) - nirmatrelvir 150mg  tab (2 tablet) by mouth twice daily AND ritonavir 100mg  tab (1 tablet) by mouth twice  daily   After reviewing above information with the patient, the patient agrees to receive Paxlovid.  Follow up instructions:    . Take prescription BID x 5 days as directed . Reach out to pharmacist for counseling on medication if desired . For concerns regarding further COVID symptoms please follow up with your PCP or urgent care . For urgent or life-threatening issues, seek care at your local emergency department  The patient was provided an opportunity to ask questions, and all were answered. The patient agreed with the plan and demonstrated an understanding of the instructions.   Script sent to [facility] [address]/   The patient was advised to call their PCP or seek an in-person evaluation if the symptoms worsen or if the condition fails to improve as anticipated.   I provided 20 minutes of non face-to-face telephone visit time during this encounter, and > 50% was spent counseling as documented under my assessment & plan.  , NP 03/31/2021 /4:35 PM

## 2021-04-12 ENCOUNTER — Ambulatory Visit: Payer: No Typology Code available for payment source | Admitting: Internal Medicine

## 2021-04-12 DIAGNOSIS — Z0289 Encounter for other administrative examinations: Secondary | ICD-10-CM

## 2021-06-28 ENCOUNTER — Other Ambulatory Visit: Payer: Self-pay

## 2021-06-28 ENCOUNTER — Encounter: Payer: Self-pay | Admitting: Internal Medicine

## 2021-06-28 ENCOUNTER — Other Ambulatory Visit: Payer: Self-pay | Admitting: Internal Medicine

## 2021-06-28 ENCOUNTER — Ambulatory Visit (INDEPENDENT_AMBULATORY_CARE_PROVIDER_SITE_OTHER): Payer: No Typology Code available for payment source | Admitting: Internal Medicine

## 2021-06-28 ENCOUNTER — Ambulatory Visit (INDEPENDENT_AMBULATORY_CARE_PROVIDER_SITE_OTHER): Payer: No Typology Code available for payment source

## 2021-06-28 VITALS — BP 118/76 | HR 76 | Temp 98.4°F | Ht 66.0 in | Wt 213.6 lb

## 2021-06-28 DIAGNOSIS — M25552 Pain in left hip: Secondary | ICD-10-CM

## 2021-06-28 DIAGNOSIS — J324 Chronic pansinusitis: Secondary | ICD-10-CM | POA: Diagnosis not present

## 2021-06-28 DIAGNOSIS — E6609 Other obesity due to excess calories: Secondary | ICD-10-CM | POA: Insufficient documentation

## 2021-06-28 DIAGNOSIS — Z1159 Encounter for screening for other viral diseases: Secondary | ICD-10-CM | POA: Insufficient documentation

## 2021-06-28 DIAGNOSIS — Z6834 Body mass index (BMI) 34.0-34.9, adult: Secondary | ICD-10-CM | POA: Diagnosis not present

## 2021-06-28 DIAGNOSIS — G8929 Other chronic pain: Secondary | ICD-10-CM | POA: Insufficient documentation

## 2021-06-28 DIAGNOSIS — F411 Generalized anxiety disorder: Secondary | ICD-10-CM

## 2021-06-28 DIAGNOSIS — R040 Epistaxis: Secondary | ICD-10-CM | POA: Diagnosis not present

## 2021-06-28 DIAGNOSIS — Z0001 Encounter for general adult medical examination with abnormal findings: Secondary | ICD-10-CM

## 2021-06-28 LAB — CBC WITH DIFFERENTIAL/PLATELET
Basophils Absolute: 0 10*3/uL (ref 0.0–0.1)
Basophils Relative: 0.9 % (ref 0.0–3.0)
Eosinophils Absolute: 0.1 10*3/uL (ref 0.0–0.7)
Eosinophils Relative: 2.2 % (ref 0.0–5.0)
HCT: 33.6 % — ABNORMAL LOW (ref 36.0–46.0)
Hemoglobin: 11.5 g/dL — ABNORMAL LOW (ref 12.0–15.0)
Lymphocytes Relative: 37 % (ref 12.0–46.0)
Lymphs Abs: 2 10*3/uL (ref 0.7–4.0)
MCHC: 34.3 g/dL (ref 30.0–36.0)
MCV: 86.6 fl (ref 78.0–100.0)
Monocytes Absolute: 0.3 10*3/uL (ref 0.1–1.0)
Monocytes Relative: 5.6 % (ref 3.0–12.0)
Neutro Abs: 2.9 10*3/uL (ref 1.4–7.7)
Neutrophils Relative %: 54.3 % (ref 43.0–77.0)
Platelets: 271 10*3/uL (ref 150.0–400.0)
RBC: 3.88 Mil/uL (ref 3.87–5.11)
RDW: 12.8 % (ref 11.5–15.5)
WBC: 5.3 10*3/uL (ref 4.0–10.5)

## 2021-06-28 LAB — LIPID PANEL
Cholesterol: 160 mg/dL (ref 0–200)
HDL: 40.4 mg/dL (ref >39.00)
LDL Cholesterol: 84 mg/dL (ref 0–99)
NonHDL: 119.34
Total CHOL/HDL Ratio: 4
Triglycerides: 177 mg/dL — ABNORMAL HIGH (ref 0.0–149.0)
VLDL: 35.4 mg/dL (ref 0.0–40.0)

## 2021-06-28 LAB — HEPATIC FUNCTION PANEL
ALT: 18 U/L (ref 0–35)
AST: 21 U/L (ref 0–37)
Albumin: 4 g/dL (ref 3.5–5.2)
Alkaline Phosphatase: 51 U/L (ref 39–117)
Bilirubin, Direct: 0 mg/dL (ref 0.0–0.3)
Total Bilirubin: 0.3 mg/dL (ref 0.2–1.2)
Total Protein: 7.1 g/dL (ref 6.0–8.3)

## 2021-06-28 LAB — BASIC METABOLIC PANEL
BUN: 14 mg/dL (ref 6–23)
CO2: 26 mEq/L (ref 19–32)
Calcium: 8.9 mg/dL (ref 8.4–10.5)
Chloride: 105 mEq/L (ref 96–112)
Creatinine, Ser: 0.91 mg/dL (ref 0.40–1.20)
GFR: 84.4 mL/min (ref >60.00)
Glucose, Bld: 83 mg/dL (ref 70–99)
Potassium: 3.5 mEq/L (ref 3.5–5.1)
Sodium: 139 mEq/L (ref 135–145)

## 2021-06-28 LAB — HCG, QUANTITATIVE, PREGNANCY: Quantitative HCG: <0.6 m[IU]/mL

## 2021-06-28 LAB — TSH: TSH: 2.59 u[IU]/mL (ref 0.35–5.50)

## 2021-06-28 LAB — C-REACTIVE PROTEIN: CRP: <1 mg/dL (ref 0.5–20.0)

## 2021-06-28 LAB — HEMOGLOBIN A1C: Hgb A1c MFr Bld: 5.8 % (ref 4.6–6.5)

## 2021-06-28 MED ORDER — BUSPIRONE HCL 10 MG PO TABS: 10.0000 mg | ORAL_TABLET | Freq: Three times a day (TID) | ORAL | 0 refills | Status: DC

## 2021-06-28 MED ORDER — DOXYCYCLINE HYCLATE 100 MG PO TABS: 100.0000 mg | ORAL_TABLET | Freq: Two times a day (BID) | ORAL | 0 refills | Status: AC

## 2021-06-28 NOTE — Progress Notes (Signed)
ronic  Subjective:  Patient ID: Victoria Wright, female    DOB: 05-30-1991  Age: 30 y.o. MRN: [id number]  CC: New Patient (Initial Visit) (physical)  This visit occurred during the SARS-CoV-2 public health emergency.  Safety protocols were in place, including screening questions prior to the visit, additional usage of staff PPE, and extensive cleaning of exam room while observing appropriate contact time as indicated for disinfecting solutions.    HPI Victoria Wright presents for a CPX and to establish.  She complains of an 52-month history of thick, green nasal phlegm with occasional epistaxis.  She quit smoking about a week ago.  She complains of nasal congestion and left facial pain.  She denies headache, vomiting, cough, shortness of breath, or night sweats.  She complains of a 1.5-year history of sharp left hip pain.  She denies any history of trauma.  She has been controlling the pain with naproxen and Tylenol.  She has a history of anxiety and would like to start taking BuSpar again.  History Victoria Wright has a past medical history of Abnormal Pap smear, Acne, Anxiety, Depression, Hypotension, UTI (lower urinary tract infection), and Vaginal Pap smear, abnormal.   She has a past surgical history that includes Wisdom tooth extraction.   Her family history includes Anxiety disorder in her father; Cancer in her maternal grandfather, maternal grandmother, paternal grandfather, and paternal uncle; Depression in her father; Diabetes in her maternal grandmother and mother; Hypertension in her mother.She reports that she quit smoking 13 days ago. Her smoking use included cigarettes. She smoked an average of .25 packs per day. She has never used smokeless tobacco. She reports that she does not currently use alcohol. She reports that she does not use drugs.  Outpatient Medications Prior to Visit  Medication Sig Dispense Refill   paragard intrauterine copper IUD IUD 1 each by Intrauterine route  once.     busPIRone (BUSPAR) 10 MG tablet Take 10 mg by mouth 3 (three) times daily.     No facility-administered medications prior to visit.    ROS Review of Systems  Constitutional: Negative.  Negative for diaphoresis, fatigue, fever and unexpected weight change.  HENT:  Positive for congestion, nosebleeds, postnasal drip, rhinorrhea, sinus pressure and sinus pain. Negative for ear pain, facial swelling, sore throat, trouble swallowing and voice change.   Eyes: Negative.   Respiratory: Negative.  Negative for cough, chest tightness, shortness of breath and wheezing.   Cardiovascular:  Negative for chest pain, palpitations and leg swelling.  Gastrointestinal:  Negative for abdominal pain.  Endocrine: Negative.   Genitourinary: Negative.  Negative for difficulty urinating.  Musculoskeletal:  Positive for arthralgias. Negative for back pain.  Neurological: Negative.  Negative for dizziness, weakness, light-headedness and headaches.  Hematological:  Negative for adenopathy. Does not bruise/bleed easily.  Psychiatric/Behavioral:  Negative for behavioral problems, confusion, decreased concentration, dysphoric mood and sleep disturbance. The patient is nervous/anxious.    Objective:  BP 118/76 (BP Location: Left Arm, Patient Position: Sitting, Cuff Size: Large)   Pulse 76   Temp 98.4 F (36.9 C) (Oral)   Ht 5\' 6"  (1.676 m)   Wt 213 lb 9.6 oz (96.9 kg)   LMP 06/07/2021 (Exact Date)   SpO2 99%   BMI 34.48 kg/m   Physical Exam Vitals reviewed.  HENT:     Right Ear: Hearing, tympanic membrane, ear canal and external ear normal.     Left Ear: Hearing, tympanic membrane, ear canal and external ear normal.  Nose: Congestion present. No mucosal edema or rhinorrhea.     Left Nostril: No epistaxis.     Right Turbinates: Enlarged.     Left Turbinates: Enlarged.     Mouth/Throat:     Mouth: Mucous membranes are moist.  Eyes:     General: No scleral icterus.    Conjunctiva/sclera:  Conjunctivae normal.  Cardiovascular:     Rate and Rhythm: Normal rate and regular rhythm.     Heart sounds: No murmur heard. Pulmonary:     Effort: Pulmonary effort is normal.     Breath sounds: No stridor. No wheezing, rhonchi or rales.  Abdominal:     General: Abdomen is flat.     Palpations: There is no mass.     Tenderness: There is no abdominal tenderness. There is no guarding.     Hernia: No hernia is present.  Musculoskeletal:        General: Normal range of motion.     Cervical back: Neck supple.     Right hip: Normal.     Left hip: Normal. No deformity, tenderness or bony tenderness. Normal range of motion.     Right lower leg: No edema.     Left lower leg: No edema.  Lymphadenopathy:     Cervical: No cervical adenopathy.  Skin:    General: Skin is warm and dry.  Neurological:     General: No focal deficit present.     Mental Status: She is oriented to person, place, and time.  Psychiatric:        Mood and Affect: Mood normal.        Behavior: Behavior normal.      Lab Results  Component Value Date   WBC 5.3 06/28/2021   HGB 11.5 (L) 06/28/2021   HCT 33.6 (L) 06/28/2021   PLT 271.0 06/28/2021   GLUCOSE 83 06/28/2021   CHOL 160 06/28/2021   TRIG 177.0 (H) 06/28/2021   HDL 40.40 06/28/2021   LDLCALC 84 06/28/2021   ALT 18 06/28/2021   AST 21 06/28/2021   NA 139 06/28/2021   K 3.5 06/28/2021   CL 105 06/28/2021   CREATININE 0.91 06/28/2021   BUN 14 06/28/2021   CO2 26 06/28/2021   TSH 2.59 06/28/2021   HGBA1C 5.8 06/28/2021     No results found.   Assessment & Plan:   Sreeja was seen today for new patient (initial visit).  Diagnoses and all orders for this visit:  Chronic pansinusitis- I will treat with a course of doxycycline.  I have also recommended that she undergo a CT scan of the sinuses to look for obstruction of the ostiomeatal complexes, masses, tumors, polyps, and air-fluid levels. -     CBC with Differential/Platelet; Future -      C-reactive protein; Future -     doxycycline (VIBRA-TABS) 100 MG tablet; Take 1 tablet (100 mg total) by mouth 2 (two) times daily for 14 days. -     CT Maxillofacial W/Cm; Future -     hCG, quantitative, pregnancy; Future -     hCG, quantitative, pregnancy -     C-reactive protein -     CBC with Differential/Platelet  Chronic left hip pain- Exam and plain films are normal.  I recommended that she see sports medicine. -     CBC with Differential/Platelet; Future -     C-reactive protein; Future -     Cancel: DG HIP UNILAT WITH PELVIS MIN 4 VIEWS LEFT; Future -  hCG, quantitative, pregnancy; Future -     hCG, quantitative, pregnancy -     C-reactive protein -     CBC with Differential/Platelet -     Ambulatory referral to Sports Medicine  Encounter for general adult medical examination with abnormal findings- Exam completed, labs reviewed, vaccines reviewed and updated, her Pap smear will be completed at her next visit, patient education was given. -     Lipid panel; Future -     Lipid panel  Class 1 obesity due to excess calories without serious comorbidity with body mass index (BMI) of 34.0 to 34.9 in adult-Labs are negative for secondary causes or complications. -     Basic metabolic panel; Future -     TSH; Future -     Hepatic function panel; Future -     Hemoglobin A1c; Future -     Hepatic function panel -     Hemoglobin A1c -     TSH -     Basic metabolic panel  Need for hepatitis C screening test -     Hepatitis C antibody; Future -     Hepatitis C antibody  Epistaxis -     CT Maxillofacial W/Cm; Future -     hCG, quantitative, pregnancy; Future -     hCG, quantitative, pregnancy  GAD (generalized anxiety disorder) -     busPIRone (BUSPAR) 10 MG tablet; Take 1 tablet (10 mg total) by mouth 3 (three) times daily.  I have changed Victoria Wright's busPIRone. I am also having her start on doxycycline. Additionally, I am having her maintain her paragard  intrauterine copper.  Meds ordered this encounter  Medications   doxycycline (VIBRA-TABS) 100 MG tablet    Sig: Take 1 tablet (100 mg total) by mouth 2 (two) times daily for 14 days.    Dispense:  28 tablet    Refill:  0   busPIRone (BUSPAR) 10 MG tablet    Sig: Take 1 tablet (10 mg total) by mouth 3 (three) times daily.    Dispense:  270 tablet    Refill:  0      Follow-up: Return in about 2 months (around 08/28/2021).  Sanda Linger, MD

## 2021-06-28 NOTE — Patient Instructions (Signed)
Health Maintenance, Female Adopting a healthy lifestyle and getting preventive care are important in promoting health and wellness. Ask your health care provider about: The right schedule for you to have regular tests and exams. Things you can do on your own to prevent diseases and keep yourself healthy. What should I know about diet, weight, and exercise? Eat a healthy diet  Eat a diet that includes plenty of vegetables, fruits, low-fat dairy products, and lean protein. Do not eat a lot of foods that are high in solid fats, added sugars, or sodium.  Maintain a healthy weight Body mass index (BMI) is used to identify weight problems. It estimates body fat based on height and weight. Your health care provider can help determineyour BMI and help you achieve or maintain a healthy weight. Get regular exercise Get regular exercise. This is one of the most important things you can do for your health. Most adults should: Exercise for at least 150 minutes each week. The exercise should increase your heart rate and make you sweat (moderate-intensity exercise). Do strengthening exercises at least twice a week. This is in addition to the moderate-intensity exercise. Spend less time sitting. Even light physical activity can be beneficial. Watch cholesterol and blood lipids Have your blood tested for lipids and cholesterol at 31 years of age, then havethis test every 5 years. Have your cholesterol levels checked more often if: Your lipid or cholesterol levels are high. You are older than 31 years of age. You are at high risk for heart disease. What should I know about cancer screening? Depending on your health history and family history, you may need to have cancer screening at various ages. This may include screening for: Breast cancer. Cervical cancer. Colorectal cancer. Skin cancer. Lung cancer. What should I know about heart disease, diabetes, and high blood pressure? Blood pressure and heart  disease High blood pressure causes heart disease and increases the risk of stroke. This is more likely to develop in people who have high blood pressure readings, are of African descent, or are overweight. Have your blood pressure checked: Every 3-5 years if you are 18-39 years of age. Every year if you are 40 years old or older. Diabetes Have regular diabetes screenings. This checks your fasting blood sugar level. Have the screening done: Once every three years after age 40 if you are at a normal weight and have a low risk for diabetes. More often and at a younger age if you are overweight or have a high risk for diabetes. What should I know about preventing infection? Hepatitis B If you have a higher risk for hepatitis B, you should be screened for this virus. Talk with your health care provider to find out if you are at risk forhepatitis B infection. Hepatitis C Testing is recommended for: Everyone born from 1945 through 1965. Anyone with known risk factors for hepatitis C. Sexually transmitted infections (STIs) Get screened for STIs, including gonorrhea and chlamydia, if: You are sexually active and are younger than 31 years of age. You are older than 31 years of age and your health care provider tells you that you are at risk for this type of infection. Your sexual activity has changed since you were last screened, and you are at increased risk for chlamydia or gonorrhea. Ask your health care provider if you are at risk. Ask your health care provider about whether you are at high risk for HIV. Your health care provider may recommend a prescription medicine to help   prevent HIV infection. If you choose to take medicine to prevent HIV, you should first get tested for HIV. You should then be tested every 3 months for as long as you are taking the medicine. Pregnancy If you are about to stop having your period (premenopausal) and you may become pregnant, seek counseling before you get  pregnant. Take 400 to 800 micrograms (mcg) of folic acid every day if you become pregnant. Ask for birth control (contraception) if you want to prevent pregnancy. Osteoporosis and menopause Osteoporosis is a disease in which the bones lose minerals and strength with aging. This can result in bone fractures. If you are 65 years old or older, or if you are at risk for osteoporosis and fractures, ask your health care provider if you should: Be screened for bone loss. Take a calcium or vitamin D supplement to lower your risk of fractures. Be given hormone replacement therapy (HRT) to treat symptoms of menopause. Follow these instructions at home: Lifestyle Do not use any products that contain nicotine or tobacco, such as cigarettes, e-cigarettes, and chewing tobacco. If you need help quitting, ask your health care provider. Do not use street drugs. Do not share needles. Ask your health care provider for help if you need support or information about quitting drugs. Alcohol use Do not drink alcohol if: Your health care provider tells you not to drink. You are pregnant, may be pregnant, or are planning to become pregnant. If you drink alcohol: Limit how much you use to 0-1 drink a day. Limit intake if you are breastfeeding. Be aware of how much alcohol is in your drink. In the [place] one drink equals one 12 oz bottle of beer (355 mL), one 5 oz glass of wine (148 mL), or one 1 oz glass of hard liquor (44 mL). General instructions Schedule regular health, dental, and eye exams. Stay current with your vaccines. Tell your health care provider if: You often feel depressed. You have ever been abused or do not feel safe at home. Summary Adopting a healthy lifestyle and getting preventive care are important in promoting health and wellness. Follow your health care provider's instructions about healthy diet, exercising, and getting tested or screened for diseases. Follow your health care provider's  instructions on monitoring your cholesterol and blood pressure. This information is not intended to replace advice given to you by your health care provider. Make sure you discuss any questions you have with your healthcare provider. Document Revised: 10/17/2018 Document Reviewed: 10/17/2018 Elsevier Patient Education  2022 [employer] Inc[employer]  

## 2021-06-29 LAB — HEPATITIS C ANTIBODY
Hepatitis C Ab: NONREACTIVE
SIGNAL TO CUT-OFF: 0.02 (ref <1.00)

## 2021-07-01 ENCOUNTER — Telehealth: Payer: Self-pay | Admitting: Internal Medicine

## 2021-07-01 NOTE — Telephone Encounter (Signed)
Patient calling in regarding recent lab results  Dr. Yetta Barre sent patient mychart message advising to come back & be tested for vitamin deficiencies  Please call patient to schedule appt [phone]

## 2021-07-01 NOTE — Telephone Encounter (Signed)
Patient will go to walk in lab at [facility]

## 2021-07-02 NOTE — Progress Notes (Deleted)
   Subjective:    I'm seeing this patient as a consultation for:  Dr. Yetta Barre. Note will be routed back to referring provider/PCP.  CC: L hip pain  I, Sherika Kubicki, LAT, ATC, am serving as scribe for Dr. Clementeen Graham.  HPI: Pt is a 30 y/o female presenting w/ c/o chronic L hip pain x .  She locates her pain to .  Radiating pain: L hip mechanical symptoms: Aggravating factors: Treatments tried:  Diagnostic imaging: L hip XR- 06/28/21  Past medical history, Surgical history, Family history, Social history, Allergies, and medications have been entered into the medical record, reviewed. ***  Review of Systems: No new headache, visual changes, nausea, vomiting, diarrhea, constipation, dizziness, abdominal pain, skin rash, fevers, chills, night sweats, weight loss, swollen lymph nodes, body aches, joint swelling, muscle aches, chest pain, shortness of breath, mood changes, visual or auditory hallucinations.   Objective:   There were no vitals filed for this visit. General: Well Developed, well nourished, and in no acute distress.  Neuro/Psych: Alert and oriented x3, extra-ocular muscles intact, able to move all 4 extremities, sensation grossly intact. Skin: Warm and dry, no rashes noted.  Respiratory: Not using accessory muscles, speaking in full sentences, trachea midline.  Cardiovascular: Pulses palpable, no extremity edema. Abdomen: Does not appear distended. MSK: ***  Lab and Radiology Results No results found for this or any previous visit (from the past 72 hour(s)). DG HIP UNILAT WITH PELVIS 2-3 VIEWS LEFT  Result Date: 06/29/2021 CLINICAL DATA:  Left hip pain. EXAM: DG HIP (WITH OR WITHOUT PELVIS) 2-3V LEFT COMPARISON:  No recent prior. FINDINGS: IUD noted in the pelvis. Pelvic calcifications consistent with phleboliths. No acute bony or joint abnormality. No evidence of fracture or dislocation. IMPRESSION: No acute bony abnormality identified. Electronically Signed   By: Maisie Fus   Register M.D.   On: 06/29/2021 11:42    Impression and Recommendations:    Assessment and Plan: 30 y.o. female with ***.  PDMP not reviewed this encounter. No orders of the defined types were placed in this encounter.  No orders of the defined types were placed in this encounter.   Discussed warning signs or symptoms. Please see discharge instructions. Patient expresses understanding.   ***

## 2021-07-05 ENCOUNTER — Other Ambulatory Visit: Payer: No Typology Code available for payment source

## 2021-07-05 ENCOUNTER — Ambulatory Visit: Payer: No Typology Code available for payment source | Admitting: Family Medicine

## 2021-07-08 ENCOUNTER — Other Ambulatory Visit: Payer: Self-pay | Admitting: Internal Medicine

## 2021-07-08 ENCOUNTER — Other Ambulatory Visit: Payer: Self-pay

## 2021-07-08 ENCOUNTER — Other Ambulatory Visit (INDEPENDENT_AMBULATORY_CARE_PROVIDER_SITE_OTHER): Payer: No Typology Code available for payment source

## 2021-07-08 DIAGNOSIS — D539 Nutritional anemia, unspecified: Secondary | ICD-10-CM

## 2021-07-08 LAB — CBC WITH DIFFERENTIAL/PLATELET
Basophils Absolute: 0 10*3/uL (ref 0.0–0.1)
Basophils Relative: 0.8 % (ref 0.0–3.0)
Eosinophils Absolute: 0.1 10*3/uL (ref 0.0–0.7)
Eosinophils Relative: 2.6 % (ref 0.0–5.0)
HCT: 33.7 % — ABNORMAL LOW (ref 36.0–46.0)
Hemoglobin: 11.5 g/dL — ABNORMAL LOW (ref 12.0–15.0)
Lymphocytes Relative: 47.6 % — ABNORMAL HIGH (ref 12.0–46.0)
Lymphs Abs: 2.4 10*3/uL (ref 0.7–4.0)
MCHC: 34.3 g/dL (ref 30.0–36.0)
MCV: 86.4 fl (ref 78.0–100.0)
Monocytes Absolute: 0.4 10*3/uL (ref 0.1–1.0)
Monocytes Relative: 8.1 % (ref 3.0–12.0)
Neutro Abs: 2 10*3/uL (ref 1.4–7.7)
Neutrophils Relative %: 40.9 % — ABNORMAL LOW (ref 43.0–77.0)
Platelets: 240 10*3/uL (ref 150.0–400.0)
RBC: 3.89 Mil/uL (ref 3.87–5.11)
RDW: 13.3 % (ref 11.5–15.5)
WBC: 5 10*3/uL (ref 4.0–10.5)

## 2021-07-08 LAB — IBC + FERRITIN
Ferritin: 13.8 ng/mL (ref 10.0–291.0)
Iron: 125 ug/dL (ref 42–145)
Saturation Ratios: 33.9 % (ref 20.0–50.0)
TIBC: 368.2 ug/dL (ref 250.0–450.0)
Transferrin: 263 mg/dL (ref 212.0–360.0)

## 2021-07-08 LAB — FOLATE: Folate: 13.1 ng/mL (ref >5.9)

## 2021-07-08 LAB — VITAMIN B12: Vitamin B-12: 648 pg/mL (ref 211–911)

## 2021-07-11 ENCOUNTER — Encounter: Payer: Self-pay | Admitting: Internal Medicine

## 2021-07-15 LAB — HEMOGLOBINOPATHY EVALUATION
Fetal Hemoglobin Testing: <1 % (ref 0.0–1.9)
HCT: 32.6 % — ABNORMAL LOW (ref 35.0–45.0)
Hemoglobin A2 - HGBRFX: 2.6 % (ref 2.2–3.2)
Hemoglobin: 11.1 g/dL — ABNORMAL LOW (ref 11.7–15.5)
Hgb A: 97.4 % (ref >96.0)
MCH: 28.7 pg (ref 27.0–33.0)
MCV: 84.2 fL (ref 80.0–100.0)
RBC: 3.87 10*6/uL (ref 3.80–5.10)
RDW: 12.5 % (ref 11.0–15.0)

## 2021-07-15 LAB — RETICULOCYTES
ABS Retic: 54040 cells/uL (ref 20000–80000)
Retic Ct Pct: 1.4 %

## 2021-07-15 LAB — VITAMIN B1: Vitamin B1 (Thiamine): 17 nmol/L (ref 8–30)

## 2021-07-18 ENCOUNTER — Encounter: Payer: Self-pay | Admitting: Internal Medicine

## 2021-07-19 ENCOUNTER — Other Ambulatory Visit: Payer: Self-pay | Admitting: Internal Medicine

## 2021-07-19 DIAGNOSIS — F3341 Major depressive disorder, recurrent, in partial remission: Secondary | ICD-10-CM | POA: Insufficient documentation

## 2021-07-19 MED ORDER — BUPROPION HCL ER (XL) 150 MG PO TB24: 150.0000 mg | ORAL_TABLET | Freq: Every day | ORAL | 0 refills | Status: DC

## 2021-07-19 MED ORDER — ESCITALOPRAM OXALATE 5 MG PO TABS: 5.0000 mg | ORAL_TABLET | Freq: Every day | ORAL | 0 refills | Status: DC

## 2021-07-21 ENCOUNTER — Inpatient Hospital Stay: Admission: RE | Admit: 2021-07-21 | Payer: No Typology Code available for payment source | Source: Ambulatory Visit

## 2021-08-17 ENCOUNTER — Encounter: Payer: Self-pay | Admitting: Internal Medicine

## 2021-08-17 ENCOUNTER — Other Ambulatory Visit: Payer: Self-pay | Admitting: Internal Medicine

## 2021-08-17 DIAGNOSIS — F411 Generalized anxiety disorder: Secondary | ICD-10-CM

## 2021-08-17 DIAGNOSIS — F3341 Major depressive disorder, recurrent, in partial remission: Secondary | ICD-10-CM

## 2021-08-17 MED ORDER — DULOXETINE HCL 30 MG PO CPEP: 30.0000 mg | ORAL_CAPSULE | Freq: Every day | ORAL | 0 refills | Status: DC

## 2021-08-17 MED ORDER — BUPROPION HCL ER (XL) 150 MG PO TB24: 150.0000 mg | ORAL_TABLET | Freq: Every day | ORAL | 0 refills | Status: DC

## 2021-08-17 MED ORDER — BUPROPION HCL ER (XL) 300 MG PO TB24: 300.0000 mg | ORAL_TABLET | Freq: Every day | ORAL | 0 refills | Status: DC

## 2021-08-17 MED ORDER — BUSPIRONE HCL 10 MG PO TABS
10.0000 mg | ORAL_TABLET | Freq: Three times a day (TID) | ORAL | 0 refills | Status: DC
  Filled 2021-08-26: qty 270, 90d supply, fill #0

## 2021-08-25 ENCOUNTER — Other Ambulatory Visit (HOSPITAL_COMMUNITY): Payer: Self-pay

## 2021-08-26 ENCOUNTER — Other Ambulatory Visit (HOSPITAL_COMMUNITY): Payer: Self-pay

## 2021-09-02 ENCOUNTER — Encounter: Payer: Self-pay | Admitting: Internal Medicine

## 2021-09-06 ENCOUNTER — Other Ambulatory Visit (HOSPITAL_COMMUNITY): Payer: Self-pay

## 2021-09-06 ENCOUNTER — Encounter: Payer: Self-pay | Admitting: Internal Medicine

## 2021-09-06 ENCOUNTER — Ambulatory Visit (INDEPENDENT_AMBULATORY_CARE_PROVIDER_SITE_OTHER): Payer: No Typology Code available for payment source | Admitting: Internal Medicine

## 2021-09-06 ENCOUNTER — Other Ambulatory Visit: Payer: Self-pay

## 2021-09-06 VITALS — BP 132/80 | HR 85 | Temp 98.5°F | Resp 16 | Ht 66.0 in | Wt 215.0 lb

## 2021-09-06 DIAGNOSIS — J0100 Acute maxillary sinusitis, unspecified: Secondary | ICD-10-CM | POA: Diagnosis not present

## 2021-09-06 DIAGNOSIS — R220 Localized swelling, mass and lump, head: Secondary | ICD-10-CM | POA: Insufficient documentation

## 2021-09-06 DIAGNOSIS — R221 Localized swelling, mass and lump, neck: Secondary | ICD-10-CM

## 2021-09-06 LAB — CBC WITH DIFFERENTIAL/PLATELET
Basophils Absolute: 0.1 10*3/uL (ref 0.0–0.1)
Basophils Relative: 1.4 % (ref 0.0–3.0)
Eosinophils Absolute: 0.1 10*3/uL (ref 0.0–0.7)
Eosinophils Relative: 3.2 % (ref 0.0–5.0)
HCT: 36.6 % (ref 36.0–46.0)
Hemoglobin: 12.4 g/dL (ref 12.0–15.0)
Lymphocytes Relative: 36.8 % (ref 12.0–46.0)
Lymphs Abs: 1.5 10*3/uL (ref 0.7–4.0)
MCHC: 33.8 g/dL (ref 30.0–36.0)
MCV: 87.4 fl (ref 78.0–100.0)
Monocytes Absolute: 0.3 10*3/uL (ref 0.1–1.0)
Monocytes Relative: 7.1 % (ref 3.0–12.0)
Neutro Abs: 2.1 10*3/uL (ref 1.4–7.7)
Neutrophils Relative %: 51.5 % (ref 43.0–77.0)
Platelets: 252 10*3/uL (ref 150.0–400.0)
RBC: 4.19 Mil/uL (ref 3.87–5.11)
RDW: 12.8 % (ref 11.5–15.5)
WBC: 4.2 10*3/uL (ref 4.0–10.5)

## 2021-09-06 LAB — C-REACTIVE PROTEIN: CRP: <1 mg/dL (ref 0.5–20.0)

## 2021-09-06 MED ORDER — SULFAMETHOXAZOLE-TRIMETHOPRIM 800-160 MG PO TABS
1.0000 | ORAL_TABLET | Freq: Two times a day (BID) | ORAL | 0 refills | Status: DC
  Filled 2021-09-06: qty 20, 10d supply, fill #0

## 2021-09-06 NOTE — Patient Instructions (Signed)
Lymphadenopathy °Lymphadenopathy means that your lymph glands are swollen or larger than normal. Lymph glands, also called lymph nodes, are collections of tissue that filter excess fluid, bacteria, viruses, and waste from your bloodstream. They are part of your body's disease-fighting system (immune system), which protects your body from germs. °There may be different causes of lymphadenopathy, depending on where it is in your body. Some types go away on their own. Lymphadenopathy can occur anywhere that you have lymph glands, including these areas: °Neck (cervical lymphadenopathy). °Chest (mediastinal lymphadenopathy). °Lungs (hilar lymphadenopathy). °Underarms (axillary lymphadenopathy). °Groin (inguinal lymphadenopathy). °When your immune system responds to germs, infection-fighting cells and fluid build up in your lymph glands. This causes some swelling and enlargement. If the lymph nodes do not go back to normal size after you have an infection or disease, your health care provider may do tests. These tests help to monitor your condition and find the reason why the glands are still swollen and enlarged. °Follow these instructions at home: ° °Get plenty of rest. °Your health care provider may recommend over-the-counter medicines for pain. Take over-the-counter and prescription medicines only as told by your health care provider. °If directed, apply heat to swollen lymph glands as often as told by your health care provider. Use the heat source that your health care provider recommends, such as a moist heat pack or a heating pad. °Place a towel between your skin and the heat source. °Leave the heat on for 20-30 minutes. °Remove the heat if your skin turns bright red. This is especially important if you are unable to feel pain, heat, or cold. You may have a greater risk of getting burned. °Check your affected lymph glands every day for changes. Check other lymph gland areas as told by your health care provider.  Check for changes such as: °More swelling. °Sudden increase in size. °Redness or pain. °Hardness. °Keep all follow-up visits. This is important. °Contact a health care provider if you have: °Lymph glands that: °Are still swollen after 2 weeks. °Have suddenly gotten bigger or the swelling spreads. °Are red, painful, or hard. °Fluid leaking from the skin near an enlarged lymph gland. °Problems with breathing. °A fever, chills, or night sweats. °Fatigue. °A sore throat. °Pain in your abdomen. °Weight loss. °Get help right away if you have: °Severe pain. °Chest pain. °Shortness of breath. °These symptoms may represent a serious problem that is an emergency. Do not wait to see if the symptoms will go away. Get medical help right away. Call your local emergency services (911 in the [place] Do not drive yourself to the hospital. °Summary °Lymphadenopathy means that your lymph glands are swollen or larger than normal. °Lymph glands, also called lymph nodes, are collections of tissue that filter excess fluid, bacteria, viruses, and waste from the bloodstream. They are part of your body's disease-fighting system (immune system). °Lymphadenopathy can occur anywhere that you have lymph glands. °If the lymph nodes do not go back to normal size after you have an infection or disease, your health care provider may do tests to monitor your condition and find the reason why the glands are still swollen and enlarged. °Check your affected lymph glands every day for changes. Check other lymph gland areas as told by your health care provider. °This information is not intended to replace advice given to you by your health care provider. Make sure you discuss any questions you have with your health care provider. °Document Revised: 08/19/2020 Document Reviewed: 08/19/2020 °Elsevier Patient Education © 2022   [employer] ° °

## 2021-09-06 NOTE — Progress Notes (Signed)
Subjective:  Patient ID: Victoria Wright, female    DOB: July 21, 1991  Age: 30 y.o. MRN: [id number]  CC: Follow-up (2 month f/u) and Sinusitis  This visit occurred during the SARS-CoV-2 public health emergency.  Safety protocols were in place, including screening questions prior to the visit, additional usage of staff PPE, and extensive cleaning of exam room while observing appropriate contact time as indicated for disinfecting solutions.    HPI [name] presents for f/up -  She has had painful swelling underneath her chin for the last week.  The swelling was noticed after she got a flu vax.  She tells me the same thing happened about 5 or 6 months ago when she got a COVID-19 vaccine.  She also complains that she is still producing thick, green, nasal phlegm.  She recently tried to take a course of doxycycline but did not tolerate it due to nausea and vomiting.  Outpatient Medications Prior to Visit  Medication Sig Dispense Refill   buPROPion (WELLBUTRIN XL) 300 MG 24 hr tablet Take 1 tablet (300 mg total) by mouth daily. 30 tablet 0   busPIRone (BUSPAR) 10 MG tablet Take 1 tablet (10 mg total) by mouth 3 (three) times daily. 270 tablet 0   DULoxetine (CYMBALTA) 30 MG capsule Take 1 capsule (30 mg total) by mouth daily. 30 capsule 0   paragard intrauterine copper IUD IUD 1 each by Intrauterine route once.     No facility-administered medications prior to visit.    ROS Review of Systems  Constitutional:  Negative for chills, diaphoresis, fatigue and fever.  HENT:  Positive for rhinorrhea. Negative for congestion, facial swelling, sinus pressure and sore throat.   Eyes: Negative.   Respiratory:  Positive for cough. Negative for chest tightness, shortness of breath and wheezing.   Cardiovascular:  Negative for chest pain, palpitations and leg swelling.  Gastrointestinal:  Negative for abdominal pain, diarrhea, nausea and vomiting.  Endocrine: Negative.   Genitourinary:  Negative.  Negative for difficulty urinating.  Musculoskeletal:  Positive for neck pain. Negative for arthralgias and back pain.  Skin:  Negative for color change, pallor and rash.  Neurological:  Negative for dizziness, weakness, light-headedness and headaches.  Hematological:  Negative for adenopathy. Does not bruise/bleed easily.  Psychiatric/Behavioral: Negative.     Objective:  BP 132/80 (BP Location: Right Arm, Patient Position: Sitting, Cuff Size: Large)   Pulse 85   Temp 98.5 F (36.9 C) (Oral)   Resp 16   Ht 5\' 6"  (1.676 m)   Wt 215 lb (97.5 kg)   LMP 08/21/2021 (Exact Date)   SpO2 99%   BMI 34.70 kg/m   BP Readings from Last 3 Encounters:  09/06/21 132/80  06/28/21 118/76  03/30/21 125/68    Wt Readings from Last 3 Encounters:  09/06/21 215 lb (97.5 kg)  06/28/21 213 lb 9.6 oz (96.9 kg)  02/03/20 218 lb (98.9 kg)    Physical Exam Vitals reviewed.  Constitutional:      Appearance: Normal appearance.  HENT:     Nose: Rhinorrhea present. No mucosal edema. Rhinorrhea is purulent.     Comments: Symmetrically in the submandibular region there is mild swelling with no lymphadenopathy, induration, fluctuance, exudates, or changes in the skin.    Mouth/Throat:     Mouth: Mucous membranes are moist.  Eyes:     Conjunctiva/sclera: Conjunctivae normal.  Cardiovascular:     Rate and Rhythm: Normal rate and regular rhythm.     Pulses: Normal  pulses.     Heart sounds: No murmur heard. Pulmonary:     Effort: Pulmonary effort is normal.     Breath sounds: No stridor. No wheezing, rhonchi or rales.  Abdominal:     General: Abdomen is flat.     Palpations: There is no mass.     Tenderness: There is no abdominal tenderness. There is no guarding.     Hernia: No hernia is present.  Musculoskeletal:        General: Normal range of motion.     Cervical back: Neck supple.     Right lower leg: No edema.     Left lower leg: No edema.  Lymphadenopathy:     Head:     Right  side of head: No submental, submandibular, posterior auricular or occipital adenopathy.     Left side of head: No submental, submandibular or posterior auricular adenopathy.     Cervical: No cervical adenopathy.     Right cervical: No superficial, deep or posterior cervical adenopathy.    Left cervical: No superficial, deep or posterior cervical adenopathy.     Upper Body:     Right upper body: No supraclavicular or axillary adenopathy.     Left upper body: No supraclavicular or axillary adenopathy.  Skin:    General: Skin is warm and dry.     Findings: No rash.  Neurological:     General: No focal deficit present.     Mental Status: She is alert.    Lab Results  Component Value Date   WBC 4.2 09/06/2021   HGB 12.4 09/06/2021   HCT 36.6 09/06/2021   PLT 252.0 09/06/2021   GLUCOSE 83 06/28/2021   CHOL 160 06/28/2021   TRIG 177.0 (H) 06/28/2021   HDL 40.40 06/28/2021   LDLCALC 84 06/28/2021   ALT 18 06/28/2021   AST 21 06/28/2021   NA 139 06/28/2021   K 3.5 06/28/2021   CL 105 06/28/2021   CREATININE 0.91 06/28/2021   BUN 14 06/28/2021   CO2 26 06/28/2021   TSH 2.59 06/28/2021   HGBA1C 5.8 06/28/2021    No results found.  Assessment & Plan:   Sireen was seen today for follow-up and sinusitis.  Diagnoses and all orders for this visit:  Submandibular swelling- Her labs are reassuring.  Will empirically treat this and the sinus infection with Bactrim DS.  I recommended that she undergo a CT with contrast to see if there is lymphadenopathy, an occult malignant process, or infection. -     CBC with Differential/Platelet; Future -     C-reactive protein; Future -     C-reactive protein -     CBC with Differential/Platelet -     CT Soft Tissue Neck W Contrast; Future  Subacute maxillary sinusitis -     sulfamethoxazole-trimethoprim (BACTRIM DS) 800-160 MG tablet; Take 1 tablet by mouth 2 (two) times daily for 10 days.  I am having Meggen N. Lusby start on  sulfamethoxazole-trimethoprim. I am also having her maintain her paragard intrauterine copper, busPIRone, buPROPion, and DULoxetine.  Meds ordered this encounter  Medications   sulfamethoxazole-trimethoprim (BACTRIM DS) 800-160 MG tablet    Sig: Take 1 tablet by mouth 2 (two) times daily for 10 days.    Dispense:  20 tablet    Refill:  0      Follow-up: Return in about 3 months (around 12/07/2021).  Scarlette Calico, MD

## 2021-09-13 ENCOUNTER — Other Ambulatory Visit: Payer: Self-pay | Admitting: Internal Medicine

## 2021-09-13 ENCOUNTER — Other Ambulatory Visit (HOSPITAL_COMMUNITY): Payer: Self-pay

## 2021-09-13 DIAGNOSIS — F3341 Major depressive disorder, recurrent, in partial remission: Secondary | ICD-10-CM

## 2021-09-13 MED ORDER — BUPROPION HCL ER (XL) 300 MG PO TB24
300.0000 mg | ORAL_TABLET | Freq: Every day | ORAL | 1 refills | Status: DC
  Filled 2021-09-13: qty 90, 90d supply, fill #0

## 2021-09-14 ENCOUNTER — Other Ambulatory Visit (HOSPITAL_COMMUNITY): Payer: Self-pay

## 2021-09-14 ENCOUNTER — Other Ambulatory Visit: Payer: Self-pay | Admitting: Internal Medicine

## 2021-09-14 DIAGNOSIS — F3341 Major depressive disorder, recurrent, in partial remission: Secondary | ICD-10-CM

## 2021-09-14 MED ORDER — DULOXETINE HCL 30 MG PO CPEP
30.0000 mg | ORAL_CAPSULE | Freq: Every day | ORAL | 1 refills | Status: DC
  Filled 2021-09-14: qty 90, 90d supply, fill #0
  Filled 2021-12-14: qty 90, 90d supply, fill #1

## 2021-09-15 ENCOUNTER — Inpatient Hospital Stay: Admission: RE | Admit: 2021-09-15 | Payer: No Typology Code available for payment source | Source: Ambulatory Visit

## 2021-09-16 ENCOUNTER — Other Ambulatory Visit: Payer: Self-pay

## 2021-09-16 ENCOUNTER — Ambulatory Visit (INDEPENDENT_AMBULATORY_CARE_PROVIDER_SITE_OTHER)
Admission: RE | Admit: 2021-09-16 | Discharge: 2021-09-16 | Disposition: A | Discharge status: Home / Self Care | Payer: No Typology Code available for payment source | Source: Ambulatory Visit | Attending: Internal Medicine | Admitting: Internal Medicine

## 2021-09-16 DIAGNOSIS — R221 Localized swelling, mass and lump, neck: Secondary | ICD-10-CM | POA: Diagnosis not present

## 2021-09-16 DIAGNOSIS — R220 Localized swelling, mass and lump, head: Secondary | ICD-10-CM

## 2021-09-16 MED ORDER — IOHEXOL 350 MG/ML SOLN
75.0000 mL | Freq: Once | INTRAVENOUS | Status: AC | PRN
  Administered 2021-09-16: 75 mL via INTRAVENOUS

## 2021-09-17 ENCOUNTER — Encounter: Payer: Self-pay | Admitting: Internal Medicine

## 2021-09-17 ENCOUNTER — Other Ambulatory Visit (HOSPITAL_COMMUNITY): Payer: Self-pay

## 2021-09-17 ENCOUNTER — Other Ambulatory Visit: Payer: Self-pay | Admitting: Internal Medicine

## 2021-09-17 DIAGNOSIS — J0100 Acute maxillary sinusitis, unspecified: Secondary | ICD-10-CM

## 2021-09-17 MED ORDER — SULFAMETHOXAZOLE-TRIMETHOPRIM 800-160 MG PO TABS
1.0000 | ORAL_TABLET | Freq: Two times a day (BID) | ORAL | 0 refills | Status: AC
  Filled 2021-09-17: qty 20, 10d supply, fill #0

## 2021-09-20 ENCOUNTER — Inpatient Hospital Stay: Admission: RE | Admit: 2021-09-20 | Payer: No Typology Code available for payment source | Source: Ambulatory Visit

## 2021-12-06 ENCOUNTER — Encounter: Payer: Self-pay | Admitting: Internal Medicine

## 2021-12-08 ENCOUNTER — Other Ambulatory Visit (HOSPITAL_COMMUNITY): Payer: Self-pay

## 2021-12-08 ENCOUNTER — Other Ambulatory Visit: Payer: Self-pay | Admitting: Internal Medicine

## 2021-12-08 DIAGNOSIS — F3341 Major depressive disorder, recurrent, in partial remission: Secondary | ICD-10-CM

## 2021-12-08 MED ORDER — CARIPRAZINE HCL 1.5 MG PO CAPS
1.5000 mg | ORAL_CAPSULE | Freq: Every day | ORAL | 0 refills | Status: DC
  Filled 2021-12-08: qty 90, 90d supply, fill #0

## 2021-12-14 ENCOUNTER — Other Ambulatory Visit: Payer: Self-pay | Admitting: Internal Medicine

## 2021-12-14 ENCOUNTER — Other Ambulatory Visit (HOSPITAL_COMMUNITY): Payer: Self-pay

## 2021-12-14 DIAGNOSIS — R413 Other amnesia: Secondary | ICD-10-CM | POA: Insufficient documentation

## 2021-12-14 DIAGNOSIS — F411 Generalized anxiety disorder: Secondary | ICD-10-CM

## 2021-12-14 MED ORDER — BUSPIRONE HCL 10 MG PO TABS
10.0000 mg | ORAL_TABLET | Freq: Three times a day (TID) | ORAL | 0 refills | Status: DC
  Filled 2021-12-14: qty 270, 90d supply, fill #0

## 2021-12-14 NOTE — Telephone Encounter (Signed)
Please advise. Thank you

## 2021-12-16 ENCOUNTER — Other Ambulatory Visit (HOSPITAL_COMMUNITY): Payer: Self-pay

## 2022-02-14 ENCOUNTER — Encounter: Payer: Self-pay | Admitting: Internal Medicine

## 2022-02-20 NOTE — Progress Notes (Signed)
? ? ?Subjective:  ? ? Patient ID: Victoria Wright, female    DOB: 08-06-1991, 31 y.o.   MRN: [id number] ? ?This visit occurred during the SARS-CoV-2 public health emergency.  Safety protocols were in place, including screening questions prior to the visit, additional usage of staff PPE, and extensive cleaning of exam room while observing appropriate contact time as indicated for disinfecting solutions. ? ? ? ?HPI ?Avyn is here for  ?Chief Complaint  ?Patient presents with  ? Depression  ? Sinusitis  ?  Post nasal drainage  ? ? ?Sinus infection - symptoms started about one year ago.  It keeps coming and going.  Has had two rounds of antibiotics.  The symptoms keep coming back.  Her last abx was about 4 months ago.  She just bought a neti pot and was going to try that.  ? ? ?Depression - she is taking Cymbalta 30 mg daily.  No Side effects.  She feels like it is not working as well now and may need more medication.  She is sleeping more, eating more and has gained weight.  She does not want to take her kids places and is screaming all the time.  She is not exercising regularly.  ? ?Also can not sit down, always moving, difficulty focusing at times.  She feels like she can not hold a single conversation w/o cutting someone off.  It makes her feel bad about herself.  She wonders if she has ADD. ? ? ?Medications and allergies reviewed with patient and updated if appropriate. ? ?Current Outpatient Medications on File Prior to Visit  ?Medication Sig Dispense Refill  ? busPIRone (BUSPAR) 10 MG tablet Take 1 tablet (10 mg total) by mouth 3 (three) times daily. 270 tablet 0  ? paragard intrauterine copper IUD IUD 1 each by Intrauterine route once.    ? [DISCONTINUED] albuterol (PROVENTIL HFA;VENTOLIN HFA) 108 (90 Base) MCG/ACT inhaler Inhale 2 puffs into the lungs every 6 (six) hours as needed for wheezing or shortness of breath. 1 Inhaler 1  ? [DISCONTINUED] Cetirizine HCl 10 MG CAPS Take 1 capsule (10 mg total) by  mouth daily. 20 capsule 0  ? [DISCONTINUED] etonogestrel-ethinyl estradiol (NUVARING) 0.12-0.015 MG/24HR vaginal ring Insert vaginally and leave in place for 3 consecutive weeks, then remove for 1 week. 1 each 12  ? ?No current facility-administered medications on file prior to visit.  ? ? ?Review of Systems  ?Constitutional:  Negative for chills and fever.  ?HENT:  Positive for congestion (green in color), postnasal drip and sinus pressure. Negative for ear pain, hearing loss, sinus pain and sore throat.   ?Respiratory:  Negative for cough, shortness of breath and wheezing.   ?Gastrointestinal:  Negative for nausea.  ?Neurological:  Positive for headaches (frontal, nasal bridge). Negative for dizziness and light-headedness.  ?Psychiatric/Behavioral:  Positive for dysphoric mood and sleep disturbance (takes melatonin, wakes often). The patient is nervous/anxious.   ? ?   ?Objective:  ? ?Vitals:  ? 02/21/22 0831  ?BP: 124/84  ?Pulse: 88  ?Temp: 98.3 ?F (36.8 ?C)  ?SpO2: 98%  ? ?BP Readings from Last 3 Encounters:  ?02/21/22 124/84  ?09/06/21 132/80  ?06/28/21 118/76  ? ?Wt Readings from Last 3 Encounters:  ?02/21/22 228 lb (103.4 kg)  ?09/06/21 215 lb (97.5 kg)  ?06/28/21 213 lb 9.6 oz (96.9 kg)  ? ?Body mass index is 36.8 kg/m?. ? ?  ?Physical Exam ?Constitutional:   ?   General: She is not in acute  distress. ?   Appearance: Normal appearance. She is not ill-appearing.  ?HENT:  ?   Head: Normocephalic and atraumatic.  ?   Right Ear: Tympanic membrane, ear canal and external ear normal.  ?   Left Ear: Tympanic membrane, ear canal and external ear normal.  ?   Mouth/Throat:  ?   Mouth: Mucous membranes are moist.  ?   Pharynx: No oropharyngeal exudate or posterior oropharyngeal erythema.  ?Eyes:  ?   Conjunctiva/sclera: Conjunctivae normal.  ?Cardiovascular:  ?   Rate and Rhythm: Normal rate and regular rhythm.  ?Pulmonary:  ?   Effort: Pulmonary effort is normal. No respiratory distress.  ?   Breath sounds: Normal  breath sounds. No wheezing or rales.  ?Musculoskeletal:  ?   Cervical back: Neck supple. No tenderness.  ?   Right lower leg: No edema.  ?   Left lower leg: No edema.  ?Lymphadenopathy:  ?   Cervical: No cervical adenopathy.  ?Skin: ?   General: Skin is warm and dry.  ?Neurological:  ?   Mental Status: She is alert.  ?Psychiatric:     ?   Mood and Affect: Mood normal.     ?   Behavior: Behavior normal.     ?   Thought Content: Thought content normal.     ?   Judgment: Judgment normal.  ? ?   ? ? ? ? ? ?Assessment & Plan:  ? ? ?Subacute maxillary sinus infection: ?Acute ?Likely bacterial  ?Start omnicef 300 mg BID x 10 day ?otc cold medications ?Start using neti pot  ?Rest, fluid ?Call if no improvement  ? ? ?Depression: ?Chronic ?Not controlled ?Increase cymbalta to 60 mg daily ?Declined referral to therapist ? ?Concentration difficulty: ?? ADD ?Has difficulty focusing, completing tasks ?Wonders about ADD ?Will refer to behavioral health for testing ? ?

## 2022-02-21 ENCOUNTER — Ambulatory Visit (INDEPENDENT_AMBULATORY_CARE_PROVIDER_SITE_OTHER): Payer: No Typology Code available for payment source | Admitting: Internal Medicine

## 2022-02-21 ENCOUNTER — Other Ambulatory Visit (HOSPITAL_COMMUNITY): Payer: Self-pay

## 2022-02-21 ENCOUNTER — Encounter: Payer: Self-pay | Admitting: Internal Medicine

## 2022-02-21 VITALS — BP 124/84 | HR 88 | Temp 98.3°F | Ht 66.0 in | Wt 228.0 lb

## 2022-02-21 DIAGNOSIS — F3289 Other specified depressive episodes: Secondary | ICD-10-CM | POA: Diagnosis not present

## 2022-02-21 DIAGNOSIS — J0100 Acute maxillary sinusitis, unspecified: Secondary | ICD-10-CM

## 2022-02-21 DIAGNOSIS — R41840 Attention and concentration deficit: Secondary | ICD-10-CM | POA: Diagnosis not present

## 2022-02-21 MED ORDER — DOXYCYCLINE HYCLATE 100 MG PO TABS
100.0000 mg | ORAL_TABLET | Freq: Two times a day (BID) | ORAL | 0 refills | Status: DC
  Filled 2022-02-21: qty 20, 10d supply, fill #0

## 2022-02-21 MED ORDER — CEFDINIR 300 MG PO CAPS
300.0000 mg | ORAL_CAPSULE | Freq: Two times a day (BID) | ORAL | 0 refills | Status: DC
  Filled 2022-02-21: qty 20, 10d supply, fill #0

## 2022-02-21 MED ORDER — DULOXETINE HCL 60 MG PO CPEP
60.0000 mg | ORAL_CAPSULE | Freq: Every day | ORAL | 1 refills | Status: DC
  Filled 2022-02-21: qty 90, 90d supply, fill #0

## 2022-02-21 NOTE — Addendum Note (Signed)
Addended by: Pincus Sanes on: 02/21/2022 09:45 AM ? ? Modules accepted: Orders ? ?

## 2022-02-21 NOTE — Patient Instructions (Addendum)
? ? ?  ? ? ?  Medications changes include :  increase cymbalta to 60 mg daily.  Start cefdinir twice daily for 10 days.  ? ? ?Your prescription(s) have been sent to your pharmacy.  ? ? ?A referral was ordered for behavorial health for ADD testing.     Someone from that office will call you to schedule an appointment.  ? ? ? ?Return for make a follow up appt with Dr Yetta Barre. Marland Kitchen Marland Kitchen ?

## 2022-03-01 ENCOUNTER — Other Ambulatory Visit (HOSPITAL_COMMUNITY): Payer: Self-pay

## 2022-03-08 ENCOUNTER — Encounter: Payer: Self-pay | Admitting: Internal Medicine

## 2022-03-09 ENCOUNTER — Other Ambulatory Visit: Payer: Self-pay | Admitting: Internal Medicine

## 2022-03-09 ENCOUNTER — Other Ambulatory Visit (HOSPITAL_COMMUNITY): Payer: Self-pay

## 2022-03-09 DIAGNOSIS — F3341 Major depressive disorder, recurrent, in partial remission: Secondary | ICD-10-CM

## 2022-03-09 DIAGNOSIS — R41840 Attention and concentration deficit: Secondary | ICD-10-CM

## 2022-03-09 MED ORDER — CARIPRAZINE HCL 1.5 MG PO CAPS
1.5000 mg | ORAL_CAPSULE | Freq: Every day | ORAL | 1 refills | Status: DC
  Filled 2022-03-09: qty 90, 90d supply, fill #0
  Filled 2022-06-08: qty 90, 90d supply, fill #1

## 2022-03-09 MED ORDER — VILAZODONE HCL 10 MG PO TABS
10.0000 mg | ORAL_TABLET | Freq: Every day | ORAL | 0 refills | Status: DC
  Filled 2022-03-09: qty 7, 7d supply, fill #0

## 2022-03-09 MED ORDER — BUPROPION HCL ER (XL) 150 MG PO TB24
150.0000 mg | ORAL_TABLET | Freq: Every day | ORAL | 0 refills | Status: DC
  Filled 2022-03-09: qty 90, 90d supply, fill #0

## 2022-03-10 ENCOUNTER — Other Ambulatory Visit (HOSPITAL_COMMUNITY): Payer: Self-pay

## 2022-03-17 ENCOUNTER — Encounter: Payer: Self-pay | Admitting: Internal Medicine

## 2022-03-17 ENCOUNTER — Other Ambulatory Visit: Payer: Self-pay | Admitting: Internal Medicine

## 2022-03-17 ENCOUNTER — Other Ambulatory Visit (HOSPITAL_COMMUNITY): Payer: Self-pay

## 2022-03-17 DIAGNOSIS — F3341 Major depressive disorder, recurrent, in partial remission: Secondary | ICD-10-CM

## 2022-03-17 DIAGNOSIS — E6609 Other obesity due to excess calories: Secondary | ICD-10-CM

## 2022-03-17 MED ORDER — VILAZODONE HCL 20 MG PO TABS
1.0000 | ORAL_TABLET | Freq: Every day | ORAL | 0 refills | Status: DC
  Filled 2022-03-17: qty 90, 90d supply, fill #0

## 2022-03-21 ENCOUNTER — Ambulatory Visit (INDEPENDENT_AMBULATORY_CARE_PROVIDER_SITE_OTHER): Payer: No Typology Code available for payment source | Admitting: Family Medicine

## 2022-03-22 ENCOUNTER — Other Ambulatory Visit (HOSPITAL_COMMUNITY): Payer: Self-pay

## 2022-04-05 ENCOUNTER — Ambulatory Visit (INDEPENDENT_AMBULATORY_CARE_PROVIDER_SITE_OTHER): Payer: Self-pay | Admitting: Family Medicine

## 2022-04-07 ENCOUNTER — Ambulatory Visit (INDEPENDENT_AMBULATORY_CARE_PROVIDER_SITE_OTHER): Payer: No Typology Code available for payment source | Admitting: Psychology

## 2022-04-07 ENCOUNTER — Encounter: Payer: Self-pay | Admitting: Psychology

## 2022-04-07 DIAGNOSIS — F339 Major depressive disorder, recurrent, unspecified: Secondary | ICD-10-CM

## 2022-04-07 DIAGNOSIS — F411 Generalized anxiety disorder: Secondary | ICD-10-CM | POA: Diagnosis not present
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
## 2022-04-07 NOTE — Progress Notes (Signed)
                Abelardo Seidner, PhD 

## 2022-04-25 ENCOUNTER — Ambulatory Visit (INDEPENDENT_AMBULATORY_CARE_PROVIDER_SITE_OTHER): Payer: No Typology Code available for payment source | Admitting: Psychology

## 2022-04-25 ENCOUNTER — Encounter: Payer: Self-pay | Admitting: Psychology

## 2022-04-25 DIAGNOSIS — F411 Generalized anxiety disorder: Secondary | ICD-10-CM

## 2022-04-25 DIAGNOSIS — F339 Major depressive disorder, recurrent, unspecified: Secondary | ICD-10-CM

## 2022-04-25 NOTE — Progress Notes (Signed)
                Masha Orbach, PhD 

## 2022-04-25 NOTE — Progress Notes (Signed)
[facility] Testing Progress Note  Patient ID: Victoria Wright, MRN: [id number],    Date: 04/25/2022  Time Spent: 12:00 - 2:00pm   Treatment Type: Testing  Met with patient for testing session.  Patient was at the clinic and session was conducted from therapist's office in person.  Reported Symptoms: Reason for Visit /Presenting Problem: Patient presents with a history of anxiety and depressed mood. She reported having problems with attention, distractibility, forgetfulness, procrastination, poor organization, restlessness, and impulsivity since childhood along with a history of childhood trauma.  Teachers suspected her as having ADHD but she was never evaluated for it.  Testing recommended to evaluate for ADHD as well as other conditions that may be affecting attention.      Mental Status Exam: Appearance:  Neat and Well Groomed     Behavior: Appropriate  Motor: Normal  Speech/Language:  Clear and Coherent and Normal Rate  Affect: Appropriate  Mood: normal  Thought process: Typical  Thought content:   WNL  Sensory/Perceptual disturbances:   WNL  Orientation: oriented to person, place, time/date, and situation  Attention: Fair  Concentration: Fair  Memory: [place] of knowledge:  Fair  Insight:   Fair  Judgment:  Good  Impulse Control: Good   Risk Assessment: Danger to Self:  No Self-injurious Behavior: No Danger to Others: No  Behavior Observations: Patient was cooperative and displayed good effort. Attention and concentration were inconsistent overall, as patient exhibited several instances each of self-correction, missed several relatively easy problems or questions, and asking questions to be repeated.  Mood was euthymic with appropriate affect.  The results appear representative of current functioning.    Subjective: Testing included the K-BIT-2 (0.75 hrs. for testing and scoring) along with the CNS Vital signs (0.75 hrs.) and BRIEF-A (0.5 hrs.).      Diagnosis:Generalized anxiety disorder  Major depressive disorder, recurrent episode, Unspecified ([place])  Plan: Testing complete. Report writing to be conducted followed by  interactive feedback next session.     Rainey Pines, PhD

## 2022-05-10 ENCOUNTER — Encounter: Payer: Self-pay | Admitting: Psychology

## 2022-05-10 NOTE — Progress Notes (Signed)
Victoria Wright is a 31 y.o. female patient. Report writing competed ( 2 hrs.).  Interactive feedback to be conducted next session. Report to be attached to the feedback progress note.         Bryson Dames, PhD

## 2022-05-11 ENCOUNTER — Encounter: Payer: Self-pay | Admitting: Psychology

## 2022-05-11 ENCOUNTER — Ambulatory Visit (INDEPENDENT_AMBULATORY_CARE_PROVIDER_SITE_OTHER): Payer: No Typology Code available for payment source | Admitting: Psychology

## 2022-05-11 DIAGNOSIS — F331 Major depressive disorder, recurrent, moderate: Secondary | ICD-10-CM | POA: Diagnosis not present

## 2022-05-11 DIAGNOSIS — F4310 Post-traumatic stress disorder, unspecified: Secondary | ICD-10-CM

## 2022-05-11 DIAGNOSIS — F902 Attention-deficit hyperactivity disorder, combined type: Secondary | ICD-10-CM | POA: Diagnosis not present

## 2022-05-11 NOTE — Progress Notes (Signed)
                Lc Joynt, PhD 
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
## 2022-05-13 ENCOUNTER — Encounter: Payer: Self-pay | Admitting: Internal Medicine

## 2022-05-13 ENCOUNTER — Telehealth: Payer: Self-pay | Admitting: *Deleted

## 2022-05-13 ENCOUNTER — Other Ambulatory Visit (HOSPITAL_COMMUNITY): Payer: Self-pay

## 2022-05-13 ENCOUNTER — Ambulatory Visit: Payer: No Typology Code available for payment source | Admitting: Internal Medicine

## 2022-05-13 VITALS — BP 122/76 | HR 80 | Temp 98.8°F | Ht 66.0 in | Wt 229.0 lb

## 2022-05-13 DIAGNOSIS — Z6834 Body mass index (BMI) 34.0-34.9, adult: Secondary | ICD-10-CM

## 2022-05-13 DIAGNOSIS — F17200 Nicotine dependence, unspecified, uncomplicated: Secondary | ICD-10-CM | POA: Diagnosis not present

## 2022-05-13 DIAGNOSIS — F909 Attention-deficit hyperactivity disorder, unspecified type: Secondary | ICD-10-CM

## 2022-05-13 DIAGNOSIS — E6609 Other obesity due to excess calories: Secondary | ICD-10-CM

## 2022-05-13 MED ORDER — LISDEXAMFETAMINE DIMESYLATE 40 MG PO CAPS
40.0000 mg | ORAL_CAPSULE | ORAL | 0 refills | Status: DC
  Filled 2022-05-13: qty 30, 30d supply, fill #0

## 2022-05-13 MED ORDER — WEGOVY 0.25 MG/0.5ML ~~LOC~~ SOAJ
0.2500 mg | SUBCUTANEOUS | 11 refills | Status: DC
Start: 2022-05-13 — End: 2022-07-07
  Filled 2022-05-13 – 2022-05-19 (×3): qty 2, 28d supply, fill #0

## 2022-05-13 NOTE — Patient Instructions (Addendum)
Please take all new medication as prescribed - the vyvanse for the ADHD, and the [facility] after the PA process  Please quit smoking  Please continue all other medications as before, and refills have been done if requested.  Please have the pharmacy call with any other refills you may need.  Please continue your efforts at being more active, low cholesterol diet, and weight control.  Please keep your appointments with your specialists as you may have planned

## 2022-05-13 NOTE — Progress Notes (Signed)
Patient ID: Victoria Wright, female   DOB: 1990-12-01, 31 y.o.   MRN: [id number]        Chief Complaint: follow up ADHD, obesity, smoking       HPI:  Victoria Wright is a 31 y.o. female here with c/o untreated ADHD as mother would not support tx for her as a younger person,, has had symptoms for many years but able to finis academics enough to work for [employer], but has been seeing therapist who convinced to finally seek tx.  Recently has been not been getting recognition for her work as in the past, tends to proscrastinate and then rushes with sloppy work in the end.  Denies worsening depressive symptoms, suicidal ideation, or panic; Also hard to lose wt with less calories and excercse.  Wt continues to increase. D/w pt to quit smoking, pt not ready.   Pt denies polydipsia, polyuria, or new focal neuro s/s.    Pt denies fever, wt loss, night sweats, loss of appetite, or other constitutional symptoms        Wt Readings from Last 3 Encounters:  05/13/22 229 lb (103.9 kg)  02/21/22 228 lb (103.4 kg)  09/06/21 215 lb (97.5 kg)   BP Readings from Last 3 Encounters:  05/13/22 122/76  02/21/22 124/84  09/06/21 132/80         Past Medical History:  Diagnosis Date   Abnormal Pap smear    follow up was wnl   Acne    Anxiety    Depression    Hypotension    taken out of work r/t bp drops   UTI (lower urinary tract infection)    Vaginal Pap smear, abnormal    Past Surgical History:  Procedure Laterality Date   WISDOM TOOTH EXTRACTION      reports that she quit smoking about 10 months ago. Her smoking use included cigarettes. She smoked an average of .25 packs per day. She has never used smokeless tobacco. She reports that she does not currently use alcohol. She reports that she does not use drugs. family history includes Anxiety disorder in her father; Cancer in her maternal grandfather, maternal grandmother, paternal grandfather, and paternal uncle; Depression in her father;  Diabetes in her maternal grandmother and mother; Hypertension in her mother. Allergies  Allergen Reactions   Cephalexin Itching   Amoxicillin Rash and Other (See Comments)    Has patient had a PCN reaction causing immediate rash, facial/tongue/throat swelling, SOB or lightheadedness with hypotension: No Has patient had a PCN reaction causing severe rash involving mucus membranes or skin necrosis: No Has patient had a PCN reaction that required hospitalization No Has patient had a PCN reaction occurring within the last 10 years: No If all of the above answers are "NO", then may proceed with Cephalosporin use.   Prednisone Rash   Current Outpatient Medications on File Prior to Visit  Medication Sig Dispense Refill   cariprazine (VRAYLAR) 1.5 MG capsule Take 1 capsule (1.5 mg total) by mouth daily. 90 capsule 1   paragard intrauterine copper IUD IUD 1 each by Intrauterine route once.     Vilazodone HCl 20 MG TABS Take 1 tablet (20 mg total) by mouth daily. 90 tablet 0   [DISCONTINUED] albuterol (PROVENTIL HFA;VENTOLIN HFA) 108 (90 Base) MCG/ACT inhaler Inhale 2 puffs into the lungs every 6 (six) hours as needed for wheezing or shortness of breath. 1 Inhaler 1   [DISCONTINUED] Cetirizine HCl 10 MG CAPS Take 1 capsule (10 mg  total) by mouth daily. 20 capsule 0   [DISCONTINUED] etonogestrel-ethinyl estradiol (NUVARING) 0.12-0.015 MG/24HR vaginal ring Insert vaginally and leave in place for 3 consecutive weeks, then remove for 1 week. 1 each 12   No current facility-administered medications on file prior to visit.        ROS:  All others reviewed and negative.  Objective        PE:  BP 122/76 (BP Location: Left Arm, Patient Position: Sitting, Cuff Size: Large)   Pulse 80   Temp 98.8 F (37.1 C) (Oral)   Ht 5\' 6"  (1.676 m)   Wt 229 lb (103.9 kg)   SpO2 99%   BMI 36.96 kg/m                 Constitutional: Pt appears in NAD               HENT: Head: NCAT.                Right Ear:  External ear normal.                 Left Ear: External ear normal.                Eyes: . Pupils are equal, round, and reactive to light. Conjunctivae and EOM are normal               Nose: without d/c or deformity               Neck: Neck supple. Gross normal ROM               Cardiovascular: Normal rate and regular rhythm.                 Pulmonary/Chest: Effort normal and breath sounds without rales or wheezing.                Abd:  Soft, NT, ND, + BS, no organomegaly               Neurological: Pt is alert. At baseline orientation, motor grossly intact               Skin: Skin is warm. No rashes, no other new lesions, LE edema - none               Psychiatric: Pt behavior is normal without agitation   Micro: none  Cardiac tracings I have personally interpreted today:  none  Pertinent Radiological findings (summarize): none   Lab Results  Component Value Date   WBC 4.2 09/06/2021   HGB 12.4 09/06/2021   HCT 36.6 09/06/2021   PLT 252.0 09/06/2021   GLUCOSE 83 06/28/2021   CHOL 160 06/28/2021   TRIG 177.0 (H) 06/28/2021   HDL 40.40 06/28/2021   LDLCALC 84 06/28/2021   ALT 18 06/28/2021   AST 21 06/28/2021   NA 139 06/28/2021   K 3.5 06/28/2021   CL 105 06/28/2021   CREATININE 0.91 06/28/2021   BUN 14 06/28/2021   CO2 26 06/28/2021   TSH 2.59 06/28/2021   HGBA1C 5.8 06/28/2021   Assessment/Plan:  Victoria Wright is a 31 y.o. Black or African American [2] female with  has a past medical history of Abnormal Pap smear, Acne, Anxiety, Depression, Hypotension, UTI (lower urinary tract infection), and Vaginal Pap smear, abnormal.  ADHD Untreated for many years, ok now for vyvanse 40 mg qd,  to f/u any worsening symptoms or concerns  Class 1  obesity due to excess calories without serious comorbidity with body mass index (BMI) of 34.0 to 34.9 in adult With increasing wt gain, ok for wegovy 0.25 mg weekly,  to f/u any worsening symptoms or concerns  Current smoker Pt  counsled to quit, pt not ready  Followup: Return if symptoms worsen or fail to improve.  Oliver Barre, MD 05/13/2022 8:02 PM [facility]

## 2022-05-13 NOTE — Telephone Encounter (Signed)
Pt was on cover-my-meds need PA on Wegovy. Submitted PA w/ (Key: [id number]). Rec'd msg Your information has been sent to [employer]...Shearon Stalls

## 2022-05-13 NOTE — Assessment & Plan Note (Signed)
Pt counsled to quit, pt not ready °

## 2022-05-13 NOTE — Assessment & Plan Note (Signed)
Untreated for many years, ok now for vyvanse 40 mg qd,  to f/u any worsening symptoms or concerns

## 2022-05-13 NOTE — Assessment & Plan Note (Signed)
With increasing wt gain, ok for wegovy 0.25 mg weekly,  to f/u any worsening symptoms or concerns

## 2022-05-17 ENCOUNTER — Other Ambulatory Visit (HOSPITAL_COMMUNITY): Payer: Self-pay

## 2022-05-18 NOTE — Telephone Encounter (Signed)
Rec'd determination fax med has been Denied. It states For the treatment of obesity through weight loss, our guideline named ANTI-OBESITY AGENTS (which includes Wegovy) requires the following rule be met for approval:You are actively enrolled in an exercise and caloric reduction program or a weight loss/behavioral modification program This request is denied because we received information that you do not meet the requirement above.

## 2022-05-19 ENCOUNTER — Other Ambulatory Visit (HOSPITAL_COMMUNITY): Payer: Self-pay

## 2022-05-19 NOTE — Telephone Encounter (Signed)
Pt called requesting update on PA for Wegovy. Advised pt "Rec'd determination fax med has been Denied. It states For the treatment of obesity through weight loss, our guideline named ANTI-OBESITY AGENTS (which includes Wegovy) requires the following rule be met for approval:You are actively enrolled in an exercise and caloric reduction program or a weight loss/behavioral modification program This request is denied because we received information that you do not meet the requirement above."   Fyi 

## 2022-06-08 ENCOUNTER — Other Ambulatory Visit: Payer: Self-pay | Admitting: Internal Medicine

## 2022-06-08 ENCOUNTER — Other Ambulatory Visit (HOSPITAL_COMMUNITY): Payer: Self-pay

## 2022-06-08 DIAGNOSIS — F3341 Major depressive disorder, recurrent, in partial remission: Secondary | ICD-10-CM

## 2022-06-08 MED ORDER — VILAZODONE HCL 20 MG PO TABS
1.0000 | ORAL_TABLET | Freq: Every day | ORAL | 0 refills | Status: DC
  Filled 2022-06-08: qty 90, 90d supply, fill #0

## 2022-06-08 NOTE — Telephone Encounter (Signed)
Ok to PCP please 

## 2022-06-09 ENCOUNTER — Other Ambulatory Visit (HOSPITAL_COMMUNITY): Payer: Self-pay

## 2022-06-09 MED ORDER — LISDEXAMFETAMINE DIMESYLATE 40 MG PO CAPS
40.0000 mg | ORAL_CAPSULE | ORAL | 0 refills | Status: DC
  Filled 2022-06-09 – 2022-06-13 (×2): qty 30, 30d supply, fill #0

## 2022-06-13 ENCOUNTER — Other Ambulatory Visit (HOSPITAL_COMMUNITY): Payer: Self-pay

## 2022-06-15 ENCOUNTER — Encounter (INDEPENDENT_AMBULATORY_CARE_PROVIDER_SITE_OTHER): Payer: Self-pay

## 2022-07-04 ENCOUNTER — Encounter: Payer: No Typology Code available for payment source | Admitting: Family Medicine

## 2022-07-07 ENCOUNTER — Ambulatory Visit (INDEPENDENT_AMBULATORY_CARE_PROVIDER_SITE_OTHER): Payer: No Typology Code available for payment source | Admitting: Family Medicine

## 2022-07-07 ENCOUNTER — Encounter: Payer: Self-pay | Admitting: Family Medicine

## 2022-07-07 VITALS — BP 120/86 | HR 77 | Temp 97.8°F | Ht 66.0 in | Wt 225.0 lb

## 2022-07-07 DIAGNOSIS — Z0000 Encounter for general adult medical examination without abnormal findings: Secondary | ICD-10-CM | POA: Diagnosis not present

## 2022-07-07 NOTE — Progress Notes (Signed)
Subjective:    Patient ID: Victoria Wright, female    DOB: 09-03-1991, 31 y.o.   MRN: [id number]  HPI Chief Complaint  Patient presents with   Annual Exam   She is here for a complete physical exam.  Social history: Lives with 3 children, divorced, works as a Clinical biochemist Smokes 1 pack per 3 days. denies drinking alcohol, drug use  Diet: unhealthy  Excerise: nothing regular   Immunizations: [employer] employee   Health maintenance:   Mammogram: 8 years ago. Benign  Colonoscopy: N/A Last Gynecological Exam:  3 years ago IUD  Last Menstrual cycle: today  Last Dental Exam: UTD Last Eye Exam:years ago   Wears seatbelt always, smoke detectors in home and functioning, does not text while driving and feels safe in home environment.   Reviewed allergies, medications, past medical, surgical, family, and social history.    Review of Systems Review of Systems Constitutional: -fever, -chills, -sweats, -unexpected weight change,-fatigue ENT: -runny nose, -ear pain, -sore throat Cardiology:  -chest pain, -palpitations, -edema Respiratory: -cough, -shortness of breath, -wheezing Gastroenterology: -abdominal pain, -nausea, -vomiting, -diarrhea, -constipation  Hematology: -bleeding or bruising problems Musculoskeletal: -arthralgias, -myalgias, -joint swelling, -back pain Ophthalmology: -vision changes Urology: -dysuria, -difficulty urinating, -hematuria, -urinary frequency, -urgency Neurology: -headache, -weakness, -tingling, -numbness       Objective:   Physical Exam  BP 120/86 (BP Location: Left Arm, Patient Position: Sitting, Cuff Size: Large)   Pulse 77   Temp 97.8 F (36.6 C) (Temporal)   Ht 5\' 6"  (1.676 m)   Wt 225 lb (102.1 kg)   SpO2 99%   BMI 36.32 kg/m   General Appearance:    Alert, cooperative, no distress, appears stated age  Head:    Normocephalic, without obvious abnormality, atraumatic  Eyes:    PERRL, conjunctiva/corneas clear, EOM's intact  Ears:    Normal  TM's and external ear canals  Nose:   Nares normal, mucosa normal, no drainage or sinus   tenderness  Throat:   Lips, mucosa, and tongue normal; teeth and gums normal  Neck:   Supple, no lymphadenopathy;  thyroid:  no   enlargement/tenderness/nodules; no JVD  Back:    Spine nontender, no curvature, ROM normal, no CVA     tenderness  Lungs:     Clear to auscultation bilaterally without wheezes, rales or     ronchi; respirations unlabored  Chest Wall:    No tenderness or deformity   Heart:    Regular rate and rhythm, S1 and S2 normal, no murmur, rub   or gallop  Breast Exam:   OB/GYN  Abdomen:     Soft, non-tender, nondistended, normoactive bowel sounds,    no masses, no hepatosplenomegaly  Genitalia:    OB/GYN  Rectal:    Not performed due to age<40 and no related complaints  Extremities:   No clubbing, cyanosis or edema  Pulses:   2+ and symmetric all extremities  Skin:   Skin color, texture, turgor normal, no rashes or lesions  Lymph nodes:   Cervical, supraclavicular, and axillary nodes normal  Neurologic:   CNII-XII intact, normal strength, sensation and gait          Psych:   Normal mood, affect, hygiene and grooming.         Assessment & Plan:  Routine general medical examination at a health care facility  She is a patient of Dr. who is here today for her CPE.  She will call and schedule with an OB/GYN  for breast and pelvic exams.  Preventative health care reviewed.  Counseling on healthy lifestyle including diet and exercise.  Recommend regular dental and eye exams.  Immunizations discussed.  She will follow-up with Dr. Yetta Barre for chronic health conditions including ADHD and weight loss medications.

## 2022-07-07 NOTE — Patient Instructions (Signed)
Obgyn Offices:   [facility] [address] [place], [place] [address] [phone]  [facility] Address: [address] [place], [place] [address] Phone: [phone]  [facility] [address] [place], [place] [address] Phone: [phone]  [facility] [address] [place], [place] [address] Phone: [phone]    Preventive Care 21-39 Years Old, Female Preventive care refers to lifestyle choices and visits with your health care provider that can promote health and wellness. Preventive care visits are also called wellness exams. What can I expect for my preventive care visit? Counseling During your preventive care visit, your health care provider may ask about your: Medical history, including: Past medical problems. Family medical history. Pregnancy history. Current health, including: Menstrual cycle. Method of birth control. Emotional well-being. Home life and relationship well-being. Sexual activity and sexual health. Lifestyle, including: Alcohol, nicotine or tobacco, and drug use. Access to firearms. Diet, exercise, and sleep habits. Work and work environment. Sunscreen use. Safety issues such as seatbelt and bike helmet use. Physical exam Your health care provider may check your: Height and weight. These may be used to calculate your BMI (body mass index). BMI is a measurement that tells if you are at a healthy weight. Waist circumference. This measures the distance around your waistline. This measurement also tells if you are at a healthy weight and may help predict your risk of certain diseases, such as type 2 diabetes and high blood pressure. Heart rate and blood pressure. Body temperature. Skin for abnormal spots. What immunizations do I need?  Vaccines are usually given at various ages, according to a schedule. Your health care provider will recommend  vaccines for you based on your age, medical history, and lifestyle or other factors, such as travel or where you work. What tests do I need? Screening Your health care provider may recommend screening tests for certain conditions. This may include: Pelvic exam and Pap test. Lipid and cholesterol levels. Diabetes screening. This is done by checking your blood sugar (glucose) after you have not eaten for a while (fasting). Hepatitis B test. Hepatitis C test. HIV (human immunodeficiency virus) test. STI (sexually transmitted infection) testing, if you are at risk. BRCA-related cancer screening. This may be done if you have a family history of breast, ovarian, tubal, or peritoneal cancers. Talk with your health care provider about your test results, treatment options, and if necessary, the need for more tests. Follow these instructions at home: Eating and drinking  Eat a healthy diet that includes fresh fruits and vegetables, whole grains, lean protein, and low-fat dairy products. Take vitamin and mineral supplements as recommended by your health care provider. Do not drink alcohol if: Your health care provider tells you not to drink. You are pregnant, may be pregnant, or are planning to become pregnant. If you drink alcohol: Limit how much you have to 0-1 drink a day. Know how much alcohol is in your drink. In the [place] one drink equals one 12 oz bottle of beer (355 mL), one 5 oz glass of wine (148 mL), or one 1 oz glass of hard liquor (44 mL). Lifestyle Brush your teeth every morning and night with fluoride toothpaste. Floss one time each day. Exercise for at least 30 minutes 5 or more days each week. Do not use any products that contain nicotine or tobacco. These products include cigarettes, chewing tobacco, and vaping devices, such as e-cigarettes. If you need help quitting, ask   your health care provider. Do not use drugs. If you are sexually active, practice safe sex. Use a condom or  other form of protection to prevent STIs. If you do not wish to become pregnant, use a form of birth control. If you plan to become pregnant, see your health care provider for a prepregnancy visit. Find healthy ways to manage stress, such as: Meditation, yoga, or listening to music. Journaling. Talking to a trusted person. Spending time with friends and family. Minimize exposure to UV radiation to reduce your risk of skin cancer. Safety Always wear your seat belt while driving or riding in a vehicle. Do not drive: If you have been drinking alcohol. Do not ride with someone who has been drinking. If you have been using any mind-altering substances or drugs. While texting. When you are tired or distracted. Wear a helmet and other protective equipment during sports activities. If you have firearms in your house, make sure you follow all gun safety procedures. Seek help if you have been physically or sexually abused. What's next? Go to your health care provider once a year for an annual wellness visit. Ask your health care provider how often you should have your eyes and teeth checked. Stay up to date on all vaccines. This information is not intended to replace advice given to you by your health care provider. Make sure you discuss any questions you have with your health care provider. Document Revised: 04/21/2021 Document Reviewed: 04/21/2021 Elsevier Patient Education  [place].

## 2022-07-19 ENCOUNTER — Ambulatory Visit (INDEPENDENT_AMBULATORY_CARE_PROVIDER_SITE_OTHER): Payer: No Typology Code available for payment source | Admitting: Internal Medicine

## 2022-07-19 ENCOUNTER — Encounter: Payer: Self-pay | Admitting: Internal Medicine

## 2022-07-19 VITALS — BP 126/84 | HR 82 | Temp 98.5°F | Ht 66.0 in | Wt 222.5 lb

## 2022-07-19 DIAGNOSIS — F909 Attention-deficit hyperactivity disorder, unspecified type: Secondary | ICD-10-CM

## 2022-07-19 DIAGNOSIS — F5081 Binge eating disorder: Secondary | ICD-10-CM

## 2022-07-19 MED ORDER — LISDEXAMFETAMINE DIMESYLATE 60 MG PO CAPS: 60.0000 mg | ORAL_CAPSULE | ORAL | 0 refills | Status: DC

## 2022-07-19 NOTE — Patient Instructions (Signed)
Attention Deficit Hyperactivity Disorder, Adult Attention deficit hyperactivity disorder (ADHD) is a mental health disorder that starts during childhood. For many people with ADHD, the disorder continues into the adult years. Treatment can help you manage your symptoms. There are three main types of ADHD: Inattentive. With this type, adults have difficulty paying attention. This may affect cognitive abilities. Hyperactive-impulsive. With this type, adults have a lot of energy and have difficulty controlling their behavior. Combination type. Some people may have symptoms of both types. What are the causes? The exact cause of ADHD is not known. Most experts believe a person's genes and environment possibly contribute to ADHD. What increases the risk? The following factors may make you more likely to develop this condition: Having a first-degree relative such as a parent, brother, or sister, with the condition. Being born before 37 weeks of pregnancy (prematurely) or at a low birth weight. Being born to a mother who smoked tobacco or drank alcohol during pregnancy. Having experienced a brain injury. Being exposed to lead or other toxins in the womb or early in life. What are the signs or symptoms? Symptoms of this condition depend on the type of ADHD. Symptoms of the inattentive type include: Difficulty paying attention or following instructions. Often making simple mistakes. Being disorganized. Avoiding tasks that require time and attention. Losing and forgetting things. Symptoms of the hyperactive-impulsive type include: Restlessness. Talking out of turn, interrupting others, or talking too much. Difficulty with: Sitting still. Feeling motivated. Relaxing. Waiting in line or waiting for a turn. People with the combination type have symptoms of both of the other types. In adults, this condition may lead to certain problems, such as: Keeping jobs. Performing tasks at work. Having  stable relationships. Being on time or keeping to a schedule. How is this diagnosed? This condition is diagnosed based on your current symptoms and your history of symptoms. The diagnosis can be made by a health care provider such as a primary care provider or a mental health care specialist. Your health care provider may use a symptom checklist or a behavior rating scale to evaluate your symptoms. Your health care provider may also want to talk with people who have observed your behaviors throughout your life. How is this treated? This condition can be treated with medicines and behavior therapy. Medicines may be the best option to reduce impulsive behaviors and improve attention. Your health care provider may recommend: Stimulant medicines. These are the most common medicines used for adult ADHD. They affect certain chemicals in the brain (neurotransmitters) and improve your ability to control your symptoms. A non-stimulant medicine. These medicines can also improve focus, attention, and impulsive behavior. It may take weeks to months to see the effects of this medicine. Counseling and behavioral management are also important for treating ADHD. Counseling is often used along with medicine. Your health care provider may suggest: Cognitive behavioral therapy (CBT). This type of therapy teaches you to replace negative thoughts and actions with positive thoughts and actions. When used as part of ADHD treatment, this therapy may also include: Coping strategies for organization, time management, impulse control, and stress reduction. Mindfulness and meditation training. Behavioral management. You may work with a coach who is specially trained to help people with ADHD manage and organize activities and function more effectively. Follow these instructions at home: Medicines  Take over-the-counter and prescription medicines only as told by your health care provider. Talk with your health care provider  about the possible side effects of your medicines and   how to manage them. Alcohol use Do not drink alcohol if: Your health care provider tells you not to drink. You are pregnant, may be pregnant, or are planning to become pregnant. If you drink alcohol: Limit how much you use to: 0-1 drink a day for women. 0-2 drinks a day for men. Know how much alcohol is in your drink. In the [place] one drink equals one 12 oz bottle of beer (355 mL), one 5 oz glass of wine (148 mL), or one 1 oz glass of hard liquor (44 mL). Lifestyle  Do not use illegal drugs. Get enough sleep. Eat a healthy diet. Exercise regularly. Exercise can help to reduce stress and anxiety. General instructions Learn as much as you can about adult ADHD, and work closely with your health care providers to find the treatments that work best for you. Follow the same schedule each day. Use reminder devices like notes, calendars, and phone apps to stay on time and organized. Keep all follow-up visits. Your health care provider will need to monitor your condition and adjust your treatment over time. Where to find more information A health care provider may be able to recommend resources that are available online or over the phone. You could start with: Attention Deficit Disorder Association (ADDA): add.org National Institute of Mental Health (NIMH): nimh.nih.gov Contact a health care provider if: Your symptoms continue to cause problems. You have side effects from your medicine, such as: Repeated muscle twitches, coughing, or speech outbursts. Sleep problems. Loss of appetite. Dizziness. Unusually fast heartbeat. Stomach pains. Headaches. You are struggling with anxiety, depression, or substance abuse. Get help right away if: You have a severe reaction to a medicine. This symptom may be an emergency. Get help right away. Call 911. Do not wait to see if the symptom will go away. Do not drive yourself to the hospital. Take  one of these steps if you feel like you may hurt yourself or others, or have thoughts about taking your own life: Go to your nearest emergency room. Call 911. Call the National Suicide Prevention Lifeline at 1-800-273-8255 or 988. This is open 24 hours a day Text the Crisis Text Line at 741741. Summary ADHD is a mental health disorder that starts during childhood and often continues into your adult years. The exact cause of ADHD is not known. Most experts believe genetics and environmental factors contribute to ADHD. There is no cure for ADHD, but treatment with medicine, cognitive behavioral therapy, or behavioral management can help you manage your condition. This information is not intended to replace advice given to you by your health care provider. Make sure you discuss any questions you have with your health care provider. Document Revised: 02/11/2022 Document Reviewed: 02/11/2022 Elsevier Patient Education  2023 Elsevier Inc.  

## 2022-07-19 NOTE — Progress Notes (Unsigned)
Subjective:  Patient ID: Victoria Wright, female    DOB: May 26, 1991  Age: 31 y.o. MRN: [id number]  CC: No chief complaint on file.   HPI Victoria Wright presents for f/up -  She is tolerating the current dose of Vyvanse well but she says it loses its effect about halfway through the day and she becomes left focused and less productive.  Also she has a history of binge eating disorder and would like to increase the dose of Vyvanse.  Outpatient Medications Prior to Visit  Medication Sig Dispense Refill   cariprazine (VRAYLAR) 1.5 MG capsule Take 1 capsule (1.5 mg total) by mouth daily. 90 capsule 1   paragard intrauterine copper IUD IUD 1 each by Intrauterine route once.     Vilazodone HCl 20 MG TABS Take 1 tablet (20 mg total) by mouth daily. 90 tablet 0   lisdexamfetamine (VYVANSE) 40 MG capsule Take 1 capsule (40 mg total) by mouth every morning. 30 capsule 0   No facility-administered medications prior to visit.    ROS Review of Systems  Constitutional:  Negative for diaphoresis and fatigue.  Eyes: Negative.   Respiratory:  Negative for cough and shortness of breath.   Cardiovascular:  Negative for leg swelling.  Genitourinary: Negative.   Psychiatric/Behavioral:  Positive for decreased concentration. Negative for behavioral problems, confusion and sleep disturbance. The patient is not nervous/anxious.   All other systems reviewed and are negative.   Objective:  BP 126/84   Pulse 82   Temp 98.5 F (36.9 C) (Oral)   Ht 5\' 6"  (1.676 m)   Wt 222 lb 8 oz (100.9 kg)   SpO2 98%   BMI 35.91 kg/m   BP Readings from Last 3 Encounters:  07/19/22 126/84  07/07/22 120/86  05/13/22 122/76    Wt Readings from Last 3 Encounters:  07/19/22 222 lb 8 oz (100.9 kg)  07/07/22 225 lb (102.1 kg)  05/13/22 229 lb (103.9 kg)    Physical Exam Vitals reviewed.  Constitutional:      Appearance: Normal appearance.  HENT:     Mouth/Throat:     Mouth: Mucous membranes  are moist.  Eyes:     General: No scleral icterus.    Conjunctiva/sclera: Conjunctivae normal.  Cardiovascular:     Rate and Rhythm: Normal rate and regular rhythm.     Heart sounds: No murmur heard. Pulmonary:     Effort: Pulmonary effort is normal.     Breath sounds: No stridor. No wheezing, rhonchi or rales.  Abdominal:     General: Abdomen is flat.     Palpations: There is no mass.     Tenderness: There is no abdominal tenderness. There is no guarding.     Hernia: No hernia is present.  Musculoskeletal:     Cervical back: Neck supple.  Lymphadenopathy:     Cervical: No cervical adenopathy.  Skin:    General: Skin is warm.  Neurological:     General: No focal deficit present.     Mental Status: She is alert.  Psychiatric:        Mood and Affect: Mood normal.        Behavior: Behavior normal.     Lab Results  Component Value Date   WBC 4.2 09/06/2021   HGB 12.4 09/06/2021   HCT 36.6 09/06/2021   PLT 252.0 09/06/2021   GLUCOSE 83 06/28/2021   CHOL 160 06/28/2021   TRIG 177.0 (H) 06/28/2021   HDL 40.40 06/28/2021  LDLCALC 84 06/28/2021   ALT 18 06/28/2021   AST 21 06/28/2021   NA 139 06/28/2021   K 3.5 06/28/2021   CL 105 06/28/2021   CREATININE 0.91 06/28/2021   BUN 14 06/28/2021   CO2 26 06/28/2021   TSH 2.59 06/28/2021   HGBA1C 5.8 06/28/2021    CT Soft Tissue Neck W Contrast  Result Date: 09/16/2021 CLINICAL DATA:  Lymphadenopathy, neck; submandibular swelling intermittent since May 2022 EXAM: CT NECK WITH CONTRAST TECHNIQUE: Multidetector CT imaging of the neck was performed using the standard protocol following the bolus administration of intravenous contrast. CONTRAST:  56mL OMNIPAQUE IOHEXOL 350 MG/ML SOLN COMPARISON:  None. FINDINGS: Pharynx and larynx: Normal. No mass or swelling. Salivary glands: No inflammation, mass, or stone. Thyroid: Normal. Lymph nodes: Left level 1A lymph node, which is not enlarged by CT size criteria but does appear  prominent, measuring to 6 mm (series 3, image 59); a retains its fatty hilum other lymph nodes are within normal limits. None are of abnormal density. Vascular: Negative. Limited intracranial: Negative. Visualized orbits: Negative. Mastoids and visualized paranasal sinuses: Mucosal thickening in the left maxillary sinus. The mastoids are well aerated. Skeleton: No acute osseous abnormality. Upper chest: Negative. Other: The radiopaque marker is seen at the midline subjacent to and slightly posterior to the mandible (series 7, image 77). This area slightly anterior to the aforementioned prominent lymph node. No other structure is seen in this area. IMPRESSION: 1. Prominent left level 1A lymph node, which appears to retain normal morphology. This is in close proximity to the area marked by the radiopaque marker; no other structure is seen in the area of the radiopaque marker. This lymph node is favored to be reactive. Attention on follow-up. 2. No other acute process in the neck. Electronically Signed   By: Wiliam Ke M.D.   On: 09/16/2021 20:03    Assessment & Plan:   Diagnoses and all orders for this visit:  Attention deficit hyperactivity disorder (ADHD), unspecified ADHD type -     Discontinue: lisdexamfetamine (VYVANSE) 60 MG capsule; Take 1 capsule (60 mg total) by mouth every morning. -     lisdexamfetamine (VYVANSE) 60 MG capsule; Take 1 capsule (60 mg total) by mouth every morning.  Binge eating disorder -     Discontinue: lisdexamfetamine (VYVANSE) 60 MG capsule; Take 1 capsule (60 mg total) by mouth every morning. -     lisdexamfetamine (VYVANSE) 60 MG capsule; Take 1 capsule (60 mg total) by mouth every morning.   I am having Victoria Wright maintain her paragard intrauterine copper, cariprazine, Vilazodone HCl, and lisdexamfetamine.  Meds ordered this encounter  Medications   DISCONTD: lisdexamfetamine (VYVANSE) 60 MG capsule    Sig: Take 1 capsule (60 mg total) by mouth every  morning.    Dispense:  90 capsule    Refill:  0   lisdexamfetamine (VYVANSE) 60 MG capsule    Sig: Take 1 capsule (60 mg total) by mouth every morning.    Dispense:  90 capsule    Refill:  0     Follow-up: Return in about 6 months (around 01/17/2023).  Sanda Linger, MD

## 2022-07-20 ENCOUNTER — Other Ambulatory Visit (HOSPITAL_COMMUNITY): Payer: Self-pay

## 2022-07-20 ENCOUNTER — Other Ambulatory Visit: Payer: Self-pay | Admitting: Internal Medicine

## 2022-07-20 ENCOUNTER — Telehealth: Payer: Self-pay | Admitting: Internal Medicine

## 2022-07-20 DIAGNOSIS — F5081 Binge eating disorder: Secondary | ICD-10-CM

## 2022-07-20 DIAGNOSIS — F909 Attention-deficit hyperactivity disorder, unspecified type: Secondary | ICD-10-CM

## 2022-07-20 MED ORDER — LISDEXAMFETAMINE DIMESYLATE 60 MG PO CAPS
60.0000 mg | ORAL_CAPSULE | ORAL | 0 refills | Status: DC
  Filled 2022-07-20: qty 90, 90d supply, fill #0

## 2022-07-20 NOTE — Telephone Encounter (Signed)
[facility] does not have patient's lisdexamfetamine (VYVANSE) 60 MG capsule in stock and do not know when they will have it available for patient. Please send RX to  [facility] Phone:  [phone]  Fax:  [phone]    Patient is completely out of medication and states she cannot function without it.

## 2022-09-08 ENCOUNTER — Other Ambulatory Visit: Payer: Self-pay | Admitting: Internal Medicine

## 2022-09-08 ENCOUNTER — Other Ambulatory Visit (HOSPITAL_COMMUNITY): Payer: Self-pay

## 2022-09-08 DIAGNOSIS — F3341 Major depressive disorder, recurrent, in partial remission: Secondary | ICD-10-CM

## 2022-09-08 MED ORDER — VILAZODONE HCL 20 MG PO TABS
1.0000 | ORAL_TABLET | Freq: Every day | ORAL | 0 refills | Status: DC
  Filled 2022-09-08: qty 30, 30d supply, fill #0
  Filled 2022-10-17: qty 30, 30d supply, fill #1
  Filled 2022-11-14: qty 30, 30d supply, fill #2

## 2022-09-08 MED ORDER — CARIPRAZINE HCL 1.5 MG PO CAPS
1.5000 mg | ORAL_CAPSULE | Freq: Every day | ORAL | 1 refills | Status: DC
  Filled 2022-09-08: qty 90, 90d supply, fill #0
  Filled 2022-12-06: qty 90, 90d supply, fill #1

## 2022-09-09 ENCOUNTER — Other Ambulatory Visit (HOSPITAL_COMMUNITY): Payer: Self-pay

## 2022-10-17 ENCOUNTER — Other Ambulatory Visit: Payer: Self-pay

## 2022-10-17 ENCOUNTER — Other Ambulatory Visit: Payer: Self-pay | Admitting: Internal Medicine

## 2022-10-17 ENCOUNTER — Other Ambulatory Visit (HOSPITAL_COMMUNITY): Payer: Self-pay

## 2022-10-17 DIAGNOSIS — F5081 Binge eating disorder: Secondary | ICD-10-CM

## 2022-10-17 DIAGNOSIS — F909 Attention-deficit hyperactivity disorder, unspecified type: Secondary | ICD-10-CM

## 2022-10-17 MED ORDER — LISDEXAMFETAMINE DIMESYLATE 60 MG PO CAPS
60.0000 mg | ORAL_CAPSULE | ORAL | 0 refills | Status: DC
  Filled 2022-10-17: qty 90, 90d supply, fill #0

## 2022-11-17 ENCOUNTER — Other Ambulatory Visit (HOSPITAL_COMMUNITY): Payer: Self-pay

## 2022-12-12 ENCOUNTER — Other Ambulatory Visit (HOSPITAL_COMMUNITY): Payer: Self-pay

## 2022-12-12 ENCOUNTER — Other Ambulatory Visit: Payer: Self-pay | Admitting: Internal Medicine

## 2022-12-12 DIAGNOSIS — F3341 Major depressive disorder, recurrent, in partial remission: Secondary | ICD-10-CM

## 2022-12-12 MED ORDER — VILAZODONE HCL 20 MG PO TABS
1.0000 | ORAL_TABLET | Freq: Every day | ORAL | 0 refills | Status: DC
  Filled 2022-12-12: qty 90, 90d supply, fill #0

## 2022-12-13 ENCOUNTER — Other Ambulatory Visit (HOSPITAL_COMMUNITY): Payer: Self-pay

## 2023-01-12 ENCOUNTER — Other Ambulatory Visit: Payer: Self-pay | Admitting: Internal Medicine

## 2023-01-12 DIAGNOSIS — F909 Attention-deficit hyperactivity disorder, unspecified type: Secondary | ICD-10-CM

## 2023-01-12 DIAGNOSIS — F5081 Binge eating disorder: Secondary | ICD-10-CM

## 2023-01-14 ENCOUNTER — Other Ambulatory Visit (HOSPITAL_COMMUNITY): Payer: Self-pay

## 2023-01-17 ENCOUNTER — Other Ambulatory Visit: Payer: Self-pay | Admitting: Internal Medicine

## 2023-01-17 ENCOUNTER — Other Ambulatory Visit (HOSPITAL_COMMUNITY): Payer: Self-pay

## 2023-01-17 DIAGNOSIS — F909 Attention-deficit hyperactivity disorder, unspecified type: Secondary | ICD-10-CM

## 2023-01-17 DIAGNOSIS — F5081 Binge eating disorder: Secondary | ICD-10-CM

## 2023-01-17 MED ORDER — LISDEXAMFETAMINE DIMESYLATE 60 MG PO CAPS
60.0000 mg | ORAL_CAPSULE | ORAL | 0 refills | Status: DC
  Filled 2023-01-17: qty 90, 90d supply, fill #0

## 2023-03-05 ENCOUNTER — Encounter: Payer: Self-pay | Admitting: Internal Medicine

## 2023-03-13 ENCOUNTER — Other Ambulatory Visit (HOSPITAL_COMMUNITY): Payer: Self-pay

## 2023-03-13 ENCOUNTER — Encounter: Payer: Self-pay | Admitting: Internal Medicine

## 2023-03-13 ENCOUNTER — Ambulatory Visit (INDEPENDENT_AMBULATORY_CARE_PROVIDER_SITE_OTHER): Payer: 59 | Admitting: Internal Medicine

## 2023-03-13 VITALS — BP 120/78 | HR 110 | Temp 98.8°F | Resp 16 | Ht 66.0 in | Wt 225.0 lb

## 2023-03-13 DIAGNOSIS — F331 Major depressive disorder, recurrent, moderate: Secondary | ICD-10-CM | POA: Insufficient documentation

## 2023-03-13 DIAGNOSIS — R000 Tachycardia, unspecified: Secondary | ICD-10-CM | POA: Diagnosis not present

## 2023-03-13 DIAGNOSIS — R0683 Snoring: Secondary | ICD-10-CM | POA: Diagnosis not present

## 2023-03-13 DIAGNOSIS — Z6836 Body mass index (BMI) 36.0-36.9, adult: Secondary | ICD-10-CM | POA: Insufficient documentation

## 2023-03-13 LAB — CBC WITH DIFFERENTIAL/PLATELET
Basophils Absolute: 0 10*3/uL (ref 0.0–0.1)
Basophils Relative: 0.6 % (ref 0.0–3.0)
Eosinophils Absolute: 0.2 10*3/uL (ref 0.0–0.7)
Eosinophils Relative: 2.6 % (ref 0.0–5.0)
HCT: 36.4 % (ref 36.0–46.0)
Hemoglobin: 12.4 g/dL (ref 12.0–15.0)
Lymphocytes Relative: 30.7 % (ref 12.0–46.0)
Lymphs Abs: 1.8 10*3/uL (ref 0.7–4.0)
MCHC: 34.1 g/dL (ref 30.0–36.0)
MCV: 87.6 fl (ref 78.0–100.0)
Monocytes Absolute: 0.4 10*3/uL (ref 0.1–1.0)
Monocytes Relative: 6.4 % (ref 3.0–12.0)
Neutro Abs: 3.5 10*3/uL (ref 1.4–7.7)
Neutrophils Relative %: 59.7 % (ref 43.0–77.0)
Platelets: 294 10*3/uL (ref 150.0–400.0)
RBC: 4.16 Mil/uL (ref 3.87–5.11)
RDW: 13.1 % (ref 11.5–15.5)
WBC: 5.8 10*3/uL (ref 4.0–10.5)

## 2023-03-13 LAB — BASIC METABOLIC PANEL
BUN: 7 mg/dL (ref 6–23)
CO2: 26 mEq/L (ref 19–32)
Calcium: 9.3 mg/dL (ref 8.4–10.5)
Chloride: 103 mEq/L (ref 96–112)
Creatinine, Ser: 0.66 mg/dL (ref 0.40–1.20)
GFR: 115.88 mL/min (ref >60.00)
Glucose, Bld: 102 mg/dL — ABNORMAL HIGH (ref 70–99)
Potassium: 3.6 mEq/L (ref 3.5–5.1)
Sodium: 137 mEq/L (ref 135–145)

## 2023-03-13 LAB — HEPATIC FUNCTION PANEL
ALT: 17 U/L (ref 0–35)
AST: 20 U/L (ref 0–37)
Albumin: 4.2 g/dL (ref 3.5–5.2)
Alkaline Phosphatase: 54 U/L (ref 39–117)
Bilirubin, Direct: 0 mg/dL (ref 0.0–0.3)
Total Bilirubin: 0.3 mg/dL (ref 0.2–1.2)
Total Protein: 7.3 g/dL (ref 6.0–8.3)

## 2023-03-13 MED ORDER — VILAZODONE HCL 40 MG PO TABS
40.0000 mg | ORAL_TABLET | Freq: Every day | ORAL | 1 refills | Status: DC
Start: 2023-03-13 — End: 2023-08-31
  Filled 2023-03-13: qty 90, 90d supply, fill #0
  Filled 2023-06-07: qty 90, 90d supply, fill #1

## 2023-03-13 MED ORDER — CARIPRAZINE HCL 1.5 MG PO CAPS
1.5000 mg | ORAL_CAPSULE | Freq: Every day | ORAL | 1 refills | Status: DC
Start: 2023-03-13 — End: 2023-08-31
  Filled 2023-03-13: qty 90, 90d supply, fill #0
  Filled 2023-06-07: qty 90, 90d supply, fill #1

## 2023-03-13 NOTE — Patient Instructions (Signed)
Sinus Tachycardia  Sinus tachycardia is a fast heartbeat. In sinus tachycardia, the heart beats more than 100 times a minute. Sinus tachycardia starts in the part of the heart called the sinoatrial (SA) node. Sinus tachycardia may be harmless, or it may be a sign of a serious condition. What are the causes? This condition may be caused by: Exercise or exertion. A fever. Pain. Loss of body fluids (dehydration). Severe bleeding (hemorrhage). Anxiety and stress. Certain substances, including: Alcohol. Caffeine. Tobacco and nicotine products. Cold medicines. Illegal drugs. Medical conditions including: Heart disease. An infection. An overactive thyroid (hyperthyroidism). A lack of red blood cells (anemia). What are the signs or symptoms? Symptoms of this condition include: A feeling that the heart is beating fast or unevenly (palpitations). Suddenly noticing your heartbeat (cardiac awareness). Lightheadedness. Tiredness (fatigue). Shortness of breath. Chest pain. Nausea. Fainting. How is this diagnosed? This condition is diagnosed with: A physical exam. Tests or monitoring, such as: Blood tests. An electrocardiogram (ECG). This test measures the electrical activity of the heart. Ambulatory cardiac monitor. This records your heartbeats for 24 hours or more. You may be referred to a heart specialist (cardiologist). How is this treated? Treatment for this condition depends on the cause. Treatment may involve: Treating the underlying condition. Taking new medicines or changing your current medicines as told by your health care provider. Making changes to your diet or lifestyle. Follow these instructions at home: Lifestyle  Do not use any products that contain nicotine or tobacco. These products include cigarettes, chewing tobacco, and vaping devices, such as e-cigarettes. If you need help quitting, ask your health care provider. Do not use illegal drugs, such as  cocaine. Learn relaxation methods to help you when you get stressed or anxious. These include deep breathing. Avoid caffeine or other stimulants, including herbal stimulants that are found in energy drinks. Alcohol use  Do not drink alcohol if: Your health care provider tells you not to drink. You are pregnant, may be pregnant, or are planning to become pregnant. If you drink alcohol: Limit how much you have to: 0-1 drink a day for women. 0-2 drinks a day for men. Know how much alcohol is in your drink. In the [place] one drink equals one 12 oz bottle of beer (355 mL), one 5 oz glass of wine (148 mL), or one 1 oz glass of hard liquor (44 mL). General instructions Drink enough fluids to keep your urine pale yellow. Take over-the-counter and prescription medicines only as told by your health care provider. Ask your health care provider about taking vitamins, herbs, and supplements. Contact a health care provider if: You have vomiting or diarrhea that does not go away. You have a fever. You have weakness or dizziness. You feel faint. Get help right away if: You have pain in your chest, upper arms, jaw, or neck. You have palpitations that do not go away. Summary In sinus tachycardia, the heart beats more than 100 times a minute. Sinus tachycardia may be harmless, or it may be a sign of a serious condition. Treatment for this condition depends on the cause or the underlying condition. Get help right away if you have pain in your chest, upper arms, jaw, or neck. This information is not intended to replace advice given to you by your health care provider. Make sure you discuss any questions you have with your health care provider. Document Revised: 02/22/2022 Document Reviewed: 02/22/2022 Elsevier Patient Education  2023 [employer] Inc.  

## 2023-03-13 NOTE — Progress Notes (Signed)
Subjective:  Patient ID: Victoria Wright, female    DOB: 12-03-1990  Age: 32 y.o. MRN: [id number]  CC: Depression   HPI Victoria Wright presents for f/up --  She complains of persistent depression symptoms with sadness, crying spells, and feeling worthless/hopeless/hopeless, but denies SI/HI.  Outpatient Medications Prior to Visit  Medication Sig Dispense Refill   lisdexamfetamine (VYVANSE) 60 MG capsule Take 1 capsule (60 mg total) by mouth every morning. 90 capsule 0   paragard intrauterine copper IUD IUD 1 each by Intrauterine route once.     cariprazine (VRAYLAR) 1.5 MG capsule Take 1 capsule (1.5 mg total) by mouth daily. 90 capsule 1   Vilazodone HCl 20 MG TABS Take 1 tablet (20 mg total) by mouth daily. 90 tablet 0   No facility-administered medications prior to visit.    ROS Review of Systems  Constitutional:  Positive for fatigue. Negative for appetite change, chills, diaphoresis, fever and unexpected weight change.  HENT: Negative.    Eyes: Negative.   Respiratory:  Positive for apnea and choking. Negative for cough, chest tightness, shortness of breath and wheezing.   Cardiovascular:  Negative for chest pain, palpitations and leg swelling.  Gastrointestinal:  Negative for abdominal pain, blood in stool, constipation, diarrhea, nausea and vomiting.  Endocrine: Negative.   Genitourinary: Negative.  Negative for difficulty urinating.  Musculoskeletal: Negative.  Negative for arthralgias.  Skin: Negative.   Hematological:  Negative for adenopathy. Does not bruise/bleed easily.  Psychiatric/Behavioral:  Positive for decreased concentration, dysphoric mood and sleep disturbance. Negative for confusion and suicidal ideas. The patient is not nervous/anxious.        +++ loud snoring with choking. She takes melatonin for insomnia.    Objective:  BP 120/78 (BP Location: Left Arm, Patient Position: Sitting, Cuff Size: Large)   Pulse (!) 110   Temp 98.8 F (37.1  C) (Oral)   Resp 16   Ht 5\' 6"  (1.676 m)   Wt 225 lb (102.1 kg)   LMP 02/14/2023 (Exact Date)   SpO2 98%   BMI 36.32 kg/m   BP Readings from Last 3 Encounters:  03/13/23 120/78  07/19/22 126/84  07/07/22 120/86    Wt Readings from Last 3 Encounters:  03/13/23 225 lb (102.1 kg)  07/19/22 222 lb 8 oz (100.9 kg)  07/07/22 225 lb (102.1 kg)    Physical Exam Vitals reviewed.  HENT:     Mouth/Throat:     Mouth: Mucous membranes are moist.  Eyes:     General: No scleral icterus.    Conjunctiva/sclera: Conjunctivae normal.  Cardiovascular:     Rate and Rhythm: Regular rhythm. Tachycardia present.     Heart sounds: Normal heart sounds, S1 normal and S2 normal. No murmur heard.    Comments: EKG- ST, 102 bpm +++artifact Flat T waves V1-V6. Pulmonary:     Effort: Pulmonary effort is normal.     Breath sounds: No stridor. No wheezing, rhonchi or rales.  Abdominal:     General: Abdomen is flat.     Palpations: There is no mass.     Tenderness: There is no abdominal tenderness. There is no guarding or rebound.     Hernia: No hernia is present.  Musculoskeletal:     Cervical back: Neck supple.     Right lower leg: No edema.     Left lower leg: No edema.  Lymphadenopathy:     Cervical: No cervical adenopathy.  Skin:    General: Skin is warm and  dry.     Coloration: Skin is not pale.  Neurological:     General: No focal deficit present.     Mental Status: She is alert and oriented to person, place, and time. Mental status is at baseline.  Psychiatric:        Attention and Perception: Attention normal.        Mood and Affect: Mood is depressed. Mood is not anxious or elated. Affect is flat. Affect is not tearful.        Speech: She is communicative. Speech is not rapid and pressured, delayed, slurred or tangential.        Behavior: Behavior normal.        Thought Content: Thought content normal. Thought content is not paranoid or delusional. Thought content does not include  homicidal or suicidal ideation.        Cognition and Memory: Cognition normal.        Judgment: Judgment normal.     Lab Results  Component Value Date   WBC 5.8 03/13/2023   HGB 12.4 03/13/2023   HCT 36.4 03/13/2023   PLT 294.0 03/13/2023   GLUCOSE 102 (H) 03/13/2023   CHOL 160 06/28/2021   TRIG 177.0 (H) 06/28/2021   HDL 40.40 06/28/2021   LDLCALC 84 06/28/2021   ALT 17 03/13/2023   AST 20 03/13/2023   NA 137 03/13/2023   K 3.6 03/13/2023   CL 103 03/13/2023   CREATININE 0.66 03/13/2023   BUN 7 03/13/2023   CO2 26 03/13/2023   TSH 1.79 03/13/2023   HGBA1C 5.8 06/28/2021    CT Soft Tissue Neck W Contrast  Result Date: 09/16/2021 CLINICAL DATA:  Lymphadenopathy, neck; submandibular swelling intermittent since May 2022 EXAM: CT NECK WITH CONTRAST TECHNIQUE: Multidetector CT imaging of the neck was performed using the standard protocol following the bolus administration of intravenous contrast. CONTRAST:  75mL OMNIPAQUE IOHEXOL 350 MG/ML SOLN COMPARISON:  None. FINDINGS: Pharynx and larynx: Normal. No mass or swelling. Salivary glands: No inflammation, mass, or stone. Thyroid: Normal. Lymph nodes: Left level 1A lymph node, which is not enlarged by CT size criteria but does appear prominent, measuring to 6 mm (series 3, image 59); a retains its fatty hilum other lymph nodes are within normal limits. None are of abnormal density. Vascular: Negative. Limited intracranial: Negative. Visualized orbits: Negative. Mastoids and visualized paranasal sinuses: Mucosal thickening in the left maxillary sinus. The mastoids are well aerated. Skeleton: No acute osseous abnormality. Upper chest: Negative. Other: The radiopaque marker is seen at the midline subjacent to and slightly posterior to the mandible (series 7, image 77). This area slightly anterior to the aforementioned prominent lymph node. No other structure is seen in this area. IMPRESSION: 1. Prominent left level 1A lymph node, which appears  to retain normal morphology. This is in close proximity to the area marked by the radiopaque marker; no other structure is seen in the area of the radiopaque marker. This lymph node is favored to be reactive. Attention on follow-up. 2. No other acute process in the neck. Electronically Signed   By: Wiliam Ke M.D.   On: 09/16/2021 20:03    Assessment & Plan:  Tachycardia- Labs are negative for secondary causes. This is likely caused by poor conditioning. Will evaluate for sleep disorder. -     CBC with Differential/Platelet; Future -     Basic metabolic panel; Future -     Thyroid Panel With TSH; Future -     Hepatic function panel;  Future  Moderate episode of recurrent major depressive disorder (HCC)- Will increase the vilazodone dose,  start bupropion, and continue vraylar. -     CBC with Differential/Platelet; Future -     Basic metabolic panel; Future -     Thyroid Panel With TSH; Future -     Hepatic function panel; Future -     Vilazodone HCl; Take 1 tablet (40 mg total) by mouth daily.  Dispense: 90 tablet; Refill: 1 -     Cariprazine HCl; Take 1 capsule (1.5 mg total) by mouth daily.  Dispense: 90 capsule; Refill: 1 -     buPROPion HCl ER (XL); Take 1 tablet (150 mg total) by mouth daily.  Dispense: 90 tablet; Refill: 0  Class 2 severe obesity due to excess calories with serious comorbidity and body mass index (BMI) of 36.0 to 36.9 in adult [facility])- labs are negative for secondary causes. -     CBC with Differential/Platelet; Future -     Basic metabolic panel; Future -     Thyroid Panel With TSH; Future -     Hepatic function panel; Future  Loud snoring -     Ambulatory referral to Sleep Studies     Follow-up: Return in about 6 months (around 09/13/2023).  Sanda Linger, MD

## 2023-03-14 ENCOUNTER — Other Ambulatory Visit (HOSPITAL_COMMUNITY): Payer: Self-pay

## 2023-03-14 LAB — THYROID PANEL WITH TSH
Free Thyroxine Index: 2.3 (ref 1.4–3.8)
T3 Uptake: 29 % (ref 22–35)
T4, Total: 7.9 ug/dL (ref 5.1–11.9)
TSH: 1.79 mIU/L

## 2023-03-14 MED ORDER — BUPROPION HCL ER (XL) 150 MG PO TB24
150.0000 mg | ORAL_TABLET | Freq: Every day | ORAL | 0 refills | Status: DC
Start: 2023-03-14 — End: 2023-06-07
  Filled 2023-03-14: qty 90, 90d supply, fill #0

## 2023-03-19 ENCOUNTER — Encounter: Payer: Self-pay | Admitting: Internal Medicine

## 2023-03-29 NOTE — Addendum Note (Signed)
Addended by: Aundra Millet on: 03/29/2023 08:55 AM   Modules accepted: Orders

## 2023-04-18 ENCOUNTER — Other Ambulatory Visit (HOSPITAL_COMMUNITY): Payer: Self-pay

## 2023-04-18 ENCOUNTER — Other Ambulatory Visit: Payer: Self-pay | Admitting: Internal Medicine

## 2023-04-18 DIAGNOSIS — F5081 Binge eating disorder: Secondary | ICD-10-CM

## 2023-04-18 DIAGNOSIS — F909 Attention-deficit hyperactivity disorder, unspecified type: Secondary | ICD-10-CM

## 2023-04-18 MED ORDER — LISDEXAMFETAMINE DIMESYLATE 60 MG PO CAPS
60.0000 mg | ORAL_CAPSULE | ORAL | 0 refills | Status: DC
Start: 2023-04-18 — End: 2023-04-21
  Filled 2023-04-18: qty 90, 90d supply, fill #0

## 2023-04-20 ENCOUNTER — Other Ambulatory Visit (HOSPITAL_COMMUNITY): Payer: Self-pay

## 2023-04-21 ENCOUNTER — Other Ambulatory Visit (HOSPITAL_COMMUNITY): Payer: Self-pay

## 2023-04-21 ENCOUNTER — Encounter: Payer: Self-pay | Admitting: Internal Medicine

## 2023-04-21 ENCOUNTER — Other Ambulatory Visit: Payer: Self-pay | Admitting: Internal Medicine

## 2023-04-21 DIAGNOSIS — F5081 Binge eating disorder: Secondary | ICD-10-CM

## 2023-04-21 MED ORDER — LISDEXAMFETAMINE DIMESYLATE 70 MG PO CAPS
70.00 mg | ORAL_CAPSULE | Freq: Every day | ORAL | 0 refills | Status: DC
Start: 2023-04-21 — End: 2023-05-16
  Filled 2023-04-21: qty 10, 10d supply, fill #0
  Filled 2023-04-21: qty 20, 20d supply, fill #0

## 2023-04-24 ENCOUNTER — Institutional Professional Consult (permissible substitution): Payer: 59 | Admitting: Neurology

## 2023-04-24 ENCOUNTER — Encounter: Payer: Self-pay | Admitting: Neurology

## 2023-05-16 ENCOUNTER — Other Ambulatory Visit: Payer: Self-pay | Admitting: Internal Medicine

## 2023-05-16 ENCOUNTER — Other Ambulatory Visit (HOSPITAL_COMMUNITY): Payer: Self-pay

## 2023-05-16 DIAGNOSIS — F5081 Binge eating disorder: Secondary | ICD-10-CM

## 2023-05-16 MED ORDER — LISDEXAMFETAMINE DIMESYLATE 70 MG PO CAPS
70.0000 mg | ORAL_CAPSULE | Freq: Every day | ORAL | 0 refills | Status: DC
Start: 2023-05-16 — End: 2023-08-22
  Filled 2023-05-16: qty 90, 90d supply, fill #0

## 2023-06-07 ENCOUNTER — Other Ambulatory Visit (HOSPITAL_COMMUNITY): Payer: Self-pay

## 2023-06-07 ENCOUNTER — Other Ambulatory Visit: Payer: Self-pay

## 2023-06-07 ENCOUNTER — Other Ambulatory Visit: Payer: Self-pay | Admitting: Internal Medicine

## 2023-06-07 DIAGNOSIS — F331 Major depressive disorder, recurrent, moderate: Secondary | ICD-10-CM

## 2023-06-07 MED ORDER — BUPROPION HCL ER (XL) 150 MG PO TB24
150.0000 mg | ORAL_TABLET | Freq: Every day | ORAL | 0 refills | Status: DC
Start: 2023-06-07 — End: 2023-06-20
  Filled 2023-06-07 – 2023-06-15 (×3): qty 90, 90d supply, fill #0

## 2023-06-08 ENCOUNTER — Other Ambulatory Visit (HOSPITAL_COMMUNITY): Payer: Self-pay

## 2023-06-15 ENCOUNTER — Other Ambulatory Visit (HOSPITAL_COMMUNITY): Payer: Self-pay

## 2023-06-20 ENCOUNTER — Telehealth: Payer: 59 | Admitting: Nurse Practitioner

## 2023-06-20 ENCOUNTER — Other Ambulatory Visit (HOSPITAL_COMMUNITY): Payer: Self-pay

## 2023-06-20 ENCOUNTER — Encounter: Payer: Self-pay | Admitting: Nurse Practitioner

## 2023-06-20 DIAGNOSIS — F17200 Nicotine dependence, unspecified, uncomplicated: Secondary | ICD-10-CM

## 2023-06-20 DIAGNOSIS — Z716 Tobacco abuse counseling: Secondary | ICD-10-CM | POA: Diagnosis not present

## 2023-06-20 MED ORDER — BUPROPION HCL ER (SR) 150 MG PO TB12
150.0000 mg | ORAL_TABLET | Freq: Two times a day (BID) | ORAL | 2 refills | Status: DC
Start: 2023-06-20 — End: 2023-11-28
  Filled 2023-06-20: qty 60, 30d supply, fill #0
  Filled 2023-07-25: qty 60, 30d supply, fill #1
  Filled 2023-08-31: qty 60, 30d supply, fill #2

## 2023-06-20 NOTE — Progress Notes (Signed)
Virtual Visit Consent   Aletheia Bickler, you are scheduled for a virtual visit with a [facility] provider today. Just as with appointments in the office, your consent must be obtained to participate. Your consent will be active for this visit and any virtual visit you may have with one of our providers in the next 365 days. If you have a MyChart account, a copy of this consent can be sent to you electronically.  As this is a virtual visit, video technology does not allow for your provider to perform a traditional examination. This may limit your provider's ability to fully assess your condition. If your provider identifies any concerns that need to be evaluated in person or the need to arrange testing (such as labs, EKG, etc.), we will make arrangements to do so. Although advances in technology are sophisticated, we cannot ensure that it will always work on either your end or our end. If the connection with a video visit is poor, the visit may have to be switched to a telephone visit. With either a video or telephone visit, we are not always able to ensure that we have a secure connection.  By engaging in this virtual visit, you consent to the provision of healthcare and authorize for your insurance to be billed (if applicable) for the services provided during this visit. Depending on your insurance coverage, you may receive a charge related to this service.  I need to obtain your verbal consent now. Are you willing to proceed with your visit today? Kanye Fatim Ardinger has provided verbal consent on 06/20/2023 for a virtual visit (video or telephone). Viviano Simas, FNP  Date: 06/20/2023 12:08 PM  Virtual Visit via Video Note   I, Viviano Simas, connected with  Ailea Steagall  ([id number], Nov 23, 1990) on 06/20/23 at 12:15 PM EDT by a video-enabled telemedicine application and verified that I am speaking with the correct person using two identifiers.  Location: Patient: Virtual Visit  Location Patient: Home Provider: Virtual Visit Location Provider: Home Office   I discussed the limitations of evaluation and management by telemedicine and the availability of in person appointments. The patient expressed understanding and agreed to proceed.    History of Present Illness: Isamar is wanting to discuss their tobacco use and is seeking assistance with cessation. Patient has been smoking/using around 1/2 pack of  cigarettes per day for the past 14 years. There has been prior attempts to quit without success. Has been able to quit for 8-9 months in the past     She has been on Wellbutrin in the past for depression (on it for the past 6 months) she stopped because she ran out of the medicine three days ago, was tolerating the medication with SE. She has also used this in higher doses in the past for smoking cessation  She has that medication prescribed through Primary Care   Triggers: The following trigger(s) for use has/have been identified: psychological: after meals, caffeine, work, bathroom & when stressed  Withdrawal Symptoms: Identified withdrawal symptoms: irritable mood.  Denies any cough with prior attempts to quit smoking  State of Readiness: Patient feels overall Ready to quit. Concerns/Barriers to Cessation - None  Observations/Objective: Patient is well-developed, well-nourished in no acute distress.  Resting comfortably  at home.  Head is normocephalic, atraumatic.  No labored breathing.  Speech is clear and coherent with logical content.  Patient is alert and oriented at baseline.    Assessment and Plan: There are no diagnoses linked  to this encounter.  - Patient is currently at the following State of Change: Preparation -- actively planning an attempt to quit - Comorbid conditions identified: None - Reviewed impacts of smoking on patient's health. - Quit date set for 06/20/23  - I have prescribed Wellbutrin (Bupropion SR) 150 mg tablets. The patient  is to start these one week before their selected quit date. This can be taken with or without food. To start, will have patient take 1 tablet by mouth daily for 3 days. Then increase to 1 tablet twice daily. We will plan to continue this for a total of 12 weeks after reassessing how the patient is doing at follow-up.  - Other resources including the [employer] and Department of Health and [employer] -- have been included in the patient's written instructions and sent to their MyChart.  - Plan for follow-up in 8-12 weeks: advised follow up with PCP because of underlying depression.  She may follow up with telehealth group earlier if she has any question/ concerns or needs any additional counseling for cessation   Follow Up Instructions: I discussed the assessment and treatment plan with the patient. The patient was provided an opportunity to ask questions and all were answered. The patient agreed with the plan and demonstrated an understanding of the instructions.  A copy of instructions were sent to the patient via MyChart unless otherwise noted below.   Patient has requested to receive PHI (AVS, Work Notes, etc) pertaining to this video visit through e-mail as they are currently without active MyChart. They have voiced understand that email is not considered secure and their health information could be viewed by someone other than the patient.   Time:  I have spent greater than 10 minutes minutes with the patient via telehealth technology in tobacco cessation counseling.    Viviano Simas, FNP

## 2023-06-20 NOTE — Patient Instructions (Signed)
Victoria Wright, thank you for joining Viviano Simas, FNP for today's virtual visit.  While this provider is not your primary care provider (PCP), if your PCP is located in our provider database this encounter information will be shared with them immediately following your visit.  Consent: (Patient) Victoria Wright provided verbal consent for this virtual visit at the beginning of the encounter.  Current Medications:  Current Outpatient Medications:    buPROPion (WELLBUTRIN XL) 150 MG 24 hr tablet, Take 1 tablet (150 mg total) by mouth daily., Disp: 90 tablet, Rfl: 0   cariprazine (VRAYLAR) 1.5 MG capsule, Take 1 capsule (1.5 mg total) by mouth daily., Disp: 90 capsule, Rfl: 1   lisdexamfetamine (VYVANSE) 70 MG capsule, Take 1 capsule (70 mg total) by mouth daily., Disp: 90 capsule, Rfl: 0   paragard intrauterine copper IUD IUD, 1 each by Intrauterine route once., Disp: , Rfl:    Vilazodone HCl (VIIBRYD) 40 MG TABS, Take 1 tablet (40 mg total) by mouth daily., Disp: 90 tablet, Rfl: 1   When you are getting low on your smoking cessation medications, schedule your next video appointment as previously discussed. This way we can follow-up with you, make any necessary adjustments/changes, and get next fill of medication sent in to your pharmacy.   Follow-Up: Call back or seek an in-person evaluation if the symptoms worsen or if the condition fails to improve as anticipated.  Other Instructions  Congratulations for your interest in quitting smoking!  Quitting smoking is one of the most important things you can do to protect your health.  We are here to help you! Did you know that if you quit smoking 1 pack per day you could save up to $2550.00 per year?  Medications are not appropriate for everyone depending upon your situation and any health issues you may have. If we do prescribe medication, it will be for 1 month at time and you will be required to have a follow up Video visit at 1  month to assess how you are doing and if there are any side effects.  This could be for up to a total of 3 months.    Support from your friends, family and work colleagues is very important. Please let them know that you are trying to stop smoking so that they understand your need for support in this goal!  - Remove tobacco products from your environment - Set a quit date ideally within 2 weeks. - You should totally abstain from smoking after your quit date. A single puff could hurt your progress or cause you to relapse - If there are others in your household that smoke, ask them to try to quit or abstain from smoking in your presence  You may notice nicotine withdrawal symptoms such as increased appetite and weight gain, changes in mood, insomnia, irritability and/or anxiety. These symptoms peak in the first three days after smoking cessation and subside over the next 3-4 weeks.   I have prescribed Wellbutrin (Bupropion SR) 150 mg tablets. Start these one week before your selected quit date. This can be taken with or without food. To start, take 1 tablet by mouth daily for 3 days. Then increase to 1 tablet twice daily. We will plan to continue this for a total of 12 weeks after reassessing how you are doing at follow-up.  A combination of behavioral and medication can improve the success of you quitting smoking. You may want to use the 1-800-QUIT-NOW free support line.  Also, the Department of Health and Human Services provides Smoke free apps for smartphones: SharedCustomer.fi  If you happen to break your plan and have a cigarette, keep taking your medications and continue to try to abstain.  Do not give up!  MAKE SURE YOU  Take any prescribed medications only as instructed.  If you miss a dose of medication, take the next dose when it is due and get back on schedule. DO NOT double up on medications.  Mark your calendar to do your Follow Up Smoking Cessation Visit in one  month   If you have been instructed to have an in-person evaluation today at a local Urgent Care facility, please use the link below. It will take you to a list of all of our available [facility], including address, phone number and hours of operation. Please do not delay care.  [facility]  If you or a family member do not have a primary care provider, use the link below to schedule a visit and establish care. When you choose a [facility] primary care physician or advanced practice provider, you gain a long-term partner in health. Find a Primary Care Provider  Learn more about [facility]'s in-office and virtual care options: [facility] - Get Care Now

## 2023-06-22 ENCOUNTER — Encounter (INDEPENDENT_AMBULATORY_CARE_PROVIDER_SITE_OTHER): Payer: Self-pay

## 2023-06-26 ENCOUNTER — Encounter: Payer: Self-pay | Admitting: Nurse Practitioner

## 2023-07-17 ENCOUNTER — Ambulatory Visit (INDEPENDENT_AMBULATORY_CARE_PROVIDER_SITE_OTHER): Payer: 59 | Admitting: Internal Medicine

## 2023-07-17 ENCOUNTER — Encounter: Payer: Self-pay | Admitting: Internal Medicine

## 2023-07-17 VITALS — BP 124/82 | HR 94 | Temp 98.6°F | Resp 16 | Ht 66.0 in | Wt 235.4 lb

## 2023-07-17 DIAGNOSIS — F331 Major depressive disorder, recurrent, moderate: Secondary | ICD-10-CM

## 2023-07-17 DIAGNOSIS — Z124 Encounter for screening for malignant neoplasm of cervix: Secondary | ICD-10-CM | POA: Insufficient documentation

## 2023-07-17 DIAGNOSIS — Z6836 Body mass index (BMI) 36.0-36.9, adult: Secondary | ICD-10-CM

## 2023-07-17 DIAGNOSIS — Z0000 Encounter for general adult medical examination without abnormal findings: Secondary | ICD-10-CM

## 2023-07-17 DIAGNOSIS — Z0001 Encounter for general adult medical examination with abnormal findings: Secondary | ICD-10-CM

## 2023-07-17 DIAGNOSIS — Z23 Encounter for immunization: Secondary | ICD-10-CM | POA: Insufficient documentation

## 2023-07-17 DIAGNOSIS — L811 Chloasma: Secondary | ICD-10-CM | POA: Diagnosis not present

## 2023-07-17 NOTE — Progress Notes (Unsigned)
Subjective:  Patient ID: Victoria Wright, female    DOB: November 29, 1990  Age: 32 y.o. MRN: [id number]  CC: Annual Exam and Rash   HPI Clotilda Maciolek presents for a CPX and f/up ----  Discussed the use of AI scribe software for clinical note transcription with the patient, who gave verbal consent to proceed.  History of Present Illness   The patient, currently on Wellbutrin, Vyvanse, Vraylar, and Viibryd, reports a significant issue with overeating despite the use of Vyvanse 70 mg. She denies any depressive symptoms, sleep disturbances, or other side effects from their current medication regimen. However, she has experienced a 10-pound weight gain since May, with their current weight at 235 pounds. She denies any triggers for their overeating or any symptoms associated with the weight gain such as fatigue, dizziness, lightheadedness, excessive thirst, or excessive urination. The patient expresses a desire to treat their overeating issue, acknowledging a repeating cycle of intending to start a diet "tomorrow" but failing to do so.  The patient's last menstrual period was on the 18th of the previous month and she confirms the presence of an IUD, negating any chance of pregnancy. Her last Pap smear was approximately five years ago. She reports no new developments in their family history. She also mentions acne scarring but denies any current active acne.       Outpatient Medications Prior to Visit  Medication Sig Dispense Refill   buPROPion (WELLBUTRIN SR) 150 MG 12 hr tablet Take 1 tablet by mouth daily for 3 days. Then increase to 1 tablet twice daily. 60 tablet 2   cariprazine (VRAYLAR) 1.5 MG capsule Take 1 capsule (1.5 mg total) by mouth daily. 90 capsule 1   lisdexamfetamine (VYVANSE) 70 MG capsule Take 1 capsule (70 mg total) by mouth daily. 90 capsule 0   paragard intrauterine copper IUD IUD 1 each by Intrauterine route once.     Vilazodone HCl (VIIBRYD) 40 MG TABS Take 1  tablet (40 mg total) by mouth daily. 90 tablet 1   No facility-administered medications prior to visit.    ROS Review of Systems  Constitutional:  Positive for unexpected weight change (wt gain). Negative for appetite change, chills, diaphoresis and fatigue.  HENT: Negative.    Eyes: Negative.   Respiratory: Negative.  Negative for cough, chest tightness and wheezing.   Cardiovascular:  Negative for chest pain, palpitations and leg swelling.  Gastrointestinal: Negative.  Negative for abdominal pain, constipation, diarrhea, nausea and vomiting.  Endocrine: Negative.   Genitourinary: Negative.  Negative for difficulty urinating.  Musculoskeletal: Negative.  Negative for arthralgias and myalgias.  Skin:  Positive for rash.  Neurological: Negative.  Negative for dizziness.  Hematological:  Negative for adenopathy. Does not bruise/bleed easily.  Psychiatric/Behavioral: Negative.      Objective:  BP 124/82   Pulse 94   Temp 98.6 F (37 C) (Oral)   Resp 16   Ht 5\' 6"  (1.676 m)   Wt 235 lb 6 oz (106.8 kg)   LMP 06/25/2023 (Exact Date)   SpO2 97%   BMI 37.99 kg/m   BP Readings from Last 3 Encounters:  07/17/23 124/82  03/13/23 120/78  07/19/22 126/84    Wt Readings from Last 3 Encounters:  07/17/23 235 lb 6 oz (106.8 kg)  03/13/23 225 lb (102.1 kg)  07/19/22 222 lb 8 oz (100.9 kg)    Physical Exam Vitals reviewed.  Constitutional:      Appearance: Normal appearance.  HENT:  Nose: Nose normal.     Mouth/Throat:     Mouth: Mucous membranes are moist.  Eyes:     General: No scleral icterus.    Conjunctiva/sclera: Conjunctivae normal.  Cardiovascular:     Rate and Rhythm: Normal rate and regular rhythm.     Heart sounds: No murmur heard. Pulmonary:     Effort: Pulmonary effort is normal.     Breath sounds: No stridor. No wheezing, rhonchi or rales.  Abdominal:     General: Abdomen is flat.     Palpations: There is no mass.     Tenderness: There is no abdominal  tenderness. There is no guarding.     Hernia: No hernia is present.  Musculoskeletal:        General: Normal range of motion.     Cervical back: Neck supple. No tenderness.  Skin:    General: Skin is warm and dry.     Findings: Rash present.  Neurological:     General: No focal deficit present.     Mental Status: She is alert. Mental status is at baseline.  Psychiatric:        Mood and Affect: Mood normal.        Behavior: Behavior normal.     Lab Results  Component Value Date   WBC 5.8 03/13/2023   HGB 12.4 03/13/2023   HCT 36.4 03/13/2023   PLT 294.0 03/13/2023   GLUCOSE 102 (H) 03/13/2023   CHOL 160 06/28/2021   TRIG 177.0 (H) 06/28/2021   HDL 40.40 06/28/2021   LDLCALC 84 06/28/2021   ALT 17 03/13/2023   AST 20 03/13/2023   NA 137 03/13/2023   K 3.6 03/13/2023   CL 103 03/13/2023   CREATININE 0.66 03/13/2023   BUN 7 03/13/2023   CO2 26 03/13/2023   TSH 1.79 03/13/2023   HGBA1C 5.8 06/28/2021    CT Soft Tissue Neck W Contrast  Result Date: 09/16/2021 CLINICAL DATA:  Lymphadenopathy, neck; submandibular swelling intermittent since May 2022 EXAM: CT NECK WITH CONTRAST TECHNIQUE: Multidetector CT imaging of the neck was performed using the standard protocol following the bolus administration of intravenous contrast. CONTRAST:  75mL OMNIPAQUE IOHEXOL 350 MG/ML SOLN COMPARISON:  None. FINDINGS: Pharynx and larynx: Normal. No mass or swelling. Salivary glands: No inflammation, mass, or stone. Thyroid: Normal. Lymph nodes: Left level 1A lymph node, which is not enlarged by CT size criteria but does appear prominent, measuring to 6 mm (series 3, image 59); a retains its fatty hilum other lymph nodes are within normal limits. None are of abnormal density. Vascular: Negative. Limited intracranial: Negative. Visualized orbits: Negative. Mastoids and visualized paranasal sinuses: Mucosal thickening in the left maxillary sinus. The mastoids are well aerated. Skeleton: No acute  osseous abnormality. Upper chest: Negative. Other: The radiopaque marker is seen at the midline subjacent to and slightly posterior to the mandible (series 7, image 77). This area slightly anterior to the aforementioned prominent lymph node. No other structure is seen in this area. IMPRESSION: 1. Prominent left level 1A lymph node, which appears to retain normal morphology. This is in close proximity to the area marked by the radiopaque marker; no other structure is seen in the area of the radiopaque marker. This lymph node is favored to be reactive. Attention on follow-up. 2. No other acute process in the neck. Electronically Signed   By: Wiliam Ke M.D.   On: 09/16/2021 20:03    Assessment & Plan:  Need for prophylactic vaccination and inoculation  against influenza -     Flu vaccine trivalent PF, 6mos and older(Flulaval,Afluria,Fluarix,Fluzone)  Melasma -     Ambulatory referral to Dermatology  Encounter for general adult medical examination with abnormal findings - Exam completed, labs reviewed, vaccines reviewed and updated, cancer screenings addressed, pt ed material was given.   Moderate episode of recurrent major depressive disorder (HCC)- She has achieved remission.  Will continue Wellbutrin, Vraylar, and Viibryd.  Class 2 severe obesity due to excess calories with serious comorbidity and body mass index (BMI) of 36.0 to 36.9 in adult [facility]) -     Amb Ref to Medical Weight Management  Cervical cancer screening -     Ambulatory referral to Gynecology     Follow-up: Return in about 6 months (around 01/14/2024).  Sanda Linger, MD

## 2023-07-17 NOTE — Patient Instructions (Signed)

## 2023-07-18 ENCOUNTER — Encounter: Payer: Self-pay | Admitting: Internal Medicine

## 2023-07-27 ENCOUNTER — Ambulatory Visit: Payer: 59 | Admitting: Family Medicine

## 2023-08-22 ENCOUNTER — Other Ambulatory Visit (HOSPITAL_COMMUNITY): Payer: Self-pay

## 2023-08-22 ENCOUNTER — Other Ambulatory Visit: Payer: Self-pay | Admitting: Internal Medicine

## 2023-08-22 DIAGNOSIS — F50819 Binge eating disorder, unspecified: Secondary | ICD-10-CM

## 2023-08-22 MED ORDER — LISDEXAMFETAMINE DIMESYLATE 70 MG PO CAPS
70.0000 mg | ORAL_CAPSULE | Freq: Every day | ORAL | 0 refills | Status: DC
Start: 2023-08-22 — End: 2023-11-24
  Filled 2023-08-22: qty 90, 90d supply, fill #0

## 2023-08-28 ENCOUNTER — Encounter: Payer: 59 | Admitting: Family Medicine

## 2023-08-31 ENCOUNTER — Other Ambulatory Visit: Payer: Self-pay

## 2023-08-31 ENCOUNTER — Other Ambulatory Visit: Payer: Self-pay | Admitting: Internal Medicine

## 2023-08-31 ENCOUNTER — Other Ambulatory Visit (HOSPITAL_COMMUNITY): Payer: Self-pay

## 2023-08-31 DIAGNOSIS — F331 Major depressive disorder, recurrent, moderate: Secondary | ICD-10-CM

## 2023-08-31 MED ORDER — CARIPRAZINE HCL 1.5 MG PO CAPS
1.5000 mg | ORAL_CAPSULE | Freq: Every day | ORAL | 1 refills | Status: DC
  Filled 2023-08-31 – 2023-11-27 (×4): qty 90, 90d supply, fill #0
  Filled 2023-11-27: qty 90, 90d supply, fill #1

## 2023-08-31 MED ORDER — VILAZODONE HCL 40 MG PO TABS
40.0000 mg | ORAL_TABLET | Freq: Every day | ORAL | 1 refills | Status: DC
Start: 2023-08-31 — End: 2024-03-11
  Filled 2023-08-31 – 2023-11-27 (×3): qty 90, 90d supply, fill #0
  Filled 2023-11-27: qty 90, 90d supply, fill #1
  Filled 2023-11-27 – 2023-11-28 (×2): qty 90, 90d supply, fill #0

## 2023-09-28 ENCOUNTER — Ambulatory Visit: Payer: 59 | Admitting: Obstetrics and Gynecology

## 2023-09-28 ENCOUNTER — Other Ambulatory Visit (HOSPITAL_COMMUNITY)
Admission: RE | Admit: 2023-09-28 | Discharge: 2023-09-28 | Disposition: A | Discharge status: Home / Self Care | Payer: 59 | Source: Ambulatory Visit | Attending: Obstetrics and Gynecology | Admitting: Obstetrics and Gynecology

## 2023-09-28 ENCOUNTER — Encounter: Payer: Self-pay | Admitting: Obstetrics and Gynecology

## 2023-09-28 VITALS — BP 110/64 | HR 113 | Ht 67.0 in | Wt 234.0 lb

## 2023-09-28 DIAGNOSIS — Z975 Presence of (intrauterine) contraceptive device: Secondary | ICD-10-CM | POA: Diagnosis not present

## 2023-09-28 DIAGNOSIS — Z01419 Encounter for gynecological examination (general) (routine) without abnormal findings: Secondary | ICD-10-CM | POA: Diagnosis not present

## 2023-09-28 DIAGNOSIS — Z124 Encounter for screening for malignant neoplasm of cervix: Secondary | ICD-10-CM | POA: Diagnosis not present

## 2023-09-28 NOTE — Patient Instructions (Signed)

## 2023-09-28 NOTE — Assessment & Plan Note (Signed)
 Cervical cancer screening performed according to ASCCP guidelines. Labs and immunizations with her primary Encouraged safe sexual practices as indicated Encouraged healthy lifestyle practices with diet and exercise For patients under 33yo, I recommend 1000mg  calcium daily and 600IU of vitamin D daily.

## 2023-09-28 NOTE — Progress Notes (Signed)
32 y.o. [id number] female with Paraguard (placed ~2020) here for annual exam. Divorced. 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 Kids 6, 10, 16yo. No complaints. No recent GYN, worried about hx of prior abnormal PAP (~2008). No sx of colpo or dysplasia treatments.  Monthly cycle, last 7d, CD2 heavy, midcycle spotting.  Abnormal bleeding: none Pelvic discharge or pain: none Breast mass, nipple discharge or skin changes : none Birth control: Paraguard Last PAP: No results found for: "DIAGPAP", "HPVHIGH", "ADEQPAP" Gardasil: completed per patient Sexually active: no, divorced  Exercising: no, difficult as single parent  GYN HISTORY: No significant history.  OB History  Gravida Para Term Preterm AB Living  6 3 3   3 3   SAB IAB Ectopic Multiple Live Births  2 1   0 3    # Outcome Date GA Lbr Len/2nd Weight Sex Type Anes PTL Lv  6 Term 12/11/17 [redacted]w[redacted]d 01:41 / 00:05 8 lb 5.5 oz (3.785 kg) F Vag-Spont None  LIV     Birth Comments: WNL  5 Term 12/22/12 [redacted]w[redacted]d 09:12 / 00:19 9 lb 7.9 oz (4.305 kg) M Vag-Spont EPI  LIV  4 Term 05/27/07 [redacted]w[redacted]d  8 lb 7 oz (3.827 kg) M Vag-Spont EPI  LIV     Complications: Postpartum hemorrhage  3 SAB           2 IAB           1 SAB             Past Medical History:  Diagnosis Date   Abnormal Pap smear    follow up was wnl   Acne    Anxiety    Depression    Hypotension    taken out of work r/t bp drops   UTI (lower urinary tract infection)    Vaginal Pap smear, abnormal     Past Surgical History:  Procedure Laterality Date   WISDOM TOOTH EXTRACTION      Current Outpatient Medications on File Prior to Visit  Medication Sig Dispense Refill   buPROPion (WELLBUTRIN SR) 150 MG 12 hr tablet Take 1 tablet by mouth daily for 3 days. Then increase to 1 tablet twice daily. 60 tablet 2   cariprazine (VRAYLAR) 1.5 MG capsule Take 1 capsule (1.5 mg total) by mouth daily. 90 capsule 1   lisdexamfetamine (VYVANSE) 70 MG capsule Take 1 capsule (70 mg  total) by mouth daily. 90 capsule 0   paragard intrauterine copper IUD IUD 1 each by Intrauterine route once.     Vilazodone HCl (VIIBRYD) 40 MG TABS Take 1 tablet (40 mg total) by mouth daily. 90 tablet 1   [DISCONTINUED] albuterol (PROVENTIL HFA;VENTOLIN HFA) 108 (90 Base) MCG/ACT inhaler Inhale 2 puffs into the lungs every 6 (six) hours as needed for wheezing or shortness of breath. 1 Inhaler 1   [DISCONTINUED] Cetirizine HCl 10 MG CAPS Take 1 capsule (10 mg total) by mouth daily. 20 capsule 0   [DISCONTINUED] etonogestrel-ethinyl estradiol (NUVARING) 0.12-0.015 MG/24HR vaginal ring Insert vaginally and leave in place for 3 consecutive weeks, then remove for 1 week. 1 each 12   No current facility-administered medications on file prior to visit.    Social History   Socioeconomic History   Marital status: Divorced    Spouse name: Not on file   Number of children: 2   Years of education: Not on file   Highest education level: Associate degree: occupational, Scientist, product/process development, or vocational program  Occupational  History   Occupation: Scientist, research (medical): Engineer, water LIVING  Tobacco Use   Smoking status: Every Day    Current packs/day: 0.50    Average packs/day: 0.5 packs/day for 14.9 years (7.4 ttl pk-yrs)    Types: Cigarettes    Start date: 2010   Smokeless tobacco: Never  Vaping Use   Vaping status: Some Days   Substances: Flavoring  Substance and Sexual Activity   Alcohol use: Not Currently   Drug use: No   Sexual activity: Not Currently    Partners: Male    Birth control/protection: I.U.D.  Other Topics Concern   Not on file  Social History Narrative   Not on file   Social Determinants of Health   Financial Resource Strain: Low Risk  (03/06/2023)   Overall Financial Resource Strain (CARDIA)    Difficulty of Paying Living Expenses: Not hard at all  Food Insecurity: No Food Insecurity (03/06/2023)   Hunger Vital Sign    Worried About Running Out of Food in the Last Year:  Never true    Ran Out of Food in the Last Year: Never true  Transportation Needs: No Transportation Needs (03/06/2023)   PRAPARE - Administrator, Civil Service (Medical): No    Lack of Transportation (Non-Medical): No  Physical Activity: Unknown (03/06/2023)   Exercise Vital Sign    Days of Exercise per Week: 0 days    Minutes of Exercise per Session: Not on file  Stress: No Stress Concern Present (03/06/2023)   [employer] of Occupational Health - Occupational Stress Questionnaire    Feeling of Stress : Only a little  Social Connections: Socially Isolated (03/06/2023)   Social Connection and Isolation Panel [NHANES]    Frequency of Communication with Friends and Family: Three times a week    Frequency of Social Gatherings with Friends and Family: Never    Attends Religious Services: Never    Database administrator or Organizations: No    Attends Engineer, structural: Not on file    Marital Status: Divorced  Catering manager Violence: Not on file    Family History  Problem Relation Age of Onset   Diabetes Mother    Hypertension Mother    Depression Father    Anxiety disorder Father    Cancer Maternal Grandmother        lung   Diabetes Maternal Grandmother    Cancer Maternal Grandfather        prostate   Cancer Paternal Grandfather        Prostate   Cancer Paternal Uncle        lung   Anesthesia problems Neg Hx    Hypotension Neg Hx    Pseudochol deficiency Neg Hx    Malignant hyperthermia Neg Hx     Allergies  Allergen Reactions   Cephalexin Itching   Amoxicillin Rash and Other (See Comments)    Has patient had a PCN reaction causing immediate rash, facial/tongue/throat swelling, SOB or lightheadedness with hypotension: No Has patient had a PCN reaction causing severe rash involving mucus membranes or skin necrosis: No Has patient had a PCN reaction that required hospitalization No Has patient had a PCN reaction occurring within the last 10  years: No If all of the above answers are "NO", then may proceed with Cephalosporin use.   Prednisone Rash      PE Today's Vitals   09/28/23 1525  BP: 110/64  Pulse: (!) 113  SpO2: 99%  Weight: 234 lb (106.1 kg)  Height: 5\' 7"  (1.702 m)   Body mass index is 36.65 kg/m.  Physical Exam Vitals reviewed. Exam conducted with a chaperone present.  Constitutional:      General: She is not in acute distress.    Appearance: Normal appearance.  HENT:     Head: Normocephalic and atraumatic.     Nose: Nose normal.  Eyes:     Extraocular Movements: Extraocular movements intact.     Conjunctiva/sclera: Conjunctivae normal.  Neck:     Thyroid: No thyroid mass, thyromegaly or thyroid tenderness.  Pulmonary:     Effort: Pulmonary effort is normal.  Chest:     Chest wall: No mass or tenderness.  Breasts:    Right: Normal. No swelling, mass, nipple discharge, skin change or tenderness.     Left: Normal. No swelling, mass, nipple discharge, skin change or tenderness.  Abdominal:     General: There is no distension.     Palpations: Abdomen is soft.     Tenderness: There is no abdominal tenderness.  Genitourinary:    General: Normal vulva.     Exam position: Lithotomy position.     Urethra: No prolapse.     Vagina: Normal. No vaginal discharge or bleeding.     Cervix: Normal. No cervical motion tenderness, discharge or lesion.     Uterus: Normal. Not enlarged and not tender.      Adnexa: Right adnexa normal and left adnexa normal.     Comments: IUD strings present. Musculoskeletal:        General: Normal range of motion.     Cervical back: Normal range of motion.  Lymphadenopathy:     Upper Body:     Right upper body: No axillary adenopathy.     Left upper body: No axillary adenopathy.     Lower Body: No right inguinal adenopathy. No left inguinal adenopathy.  Skin:    General: Skin is warm and dry.  Neurological:     General: No focal deficit present.     Mental Status: She  is alert.  Psychiatric:        Mood and Affect: Mood normal.        Behavior: Behavior normal.       Assessment and Plan:        Well woman exam with routine gynecological exam Assessment & Plan: Cervical cancer screening performed according to ASCCP guidelines. Labs and immunizations with her primary Encouraged safe sexual practices as indicated Encouraged healthy lifestyle practices with diet and exercise For patients under 50yo, I recommend 1000mg  calcium daily and 600IU of vitamin D daily.    Cervical cancer screening -     Cytology - PAP  IUD (intrauterine device) in place    Rosalyn Gess, MD

## 2023-10-03 LAB — CYTOLOGY - PAP
Comment: NEGATIVE
Diagnosis: NEGATIVE
High risk HPV: NEGATIVE

## 2023-11-24 ENCOUNTER — Other Ambulatory Visit: Payer: Self-pay | Admitting: Internal Medicine

## 2023-11-24 ENCOUNTER — Other Ambulatory Visit: Payer: Self-pay | Admitting: Nurse Practitioner

## 2023-11-24 ENCOUNTER — Other Ambulatory Visit (HOSPITAL_COMMUNITY): Payer: Self-pay

## 2023-11-24 DIAGNOSIS — F50819 Binge eating disorder, unspecified: Secondary | ICD-10-CM

## 2023-11-24 DIAGNOSIS — Z716 Tobacco abuse counseling: Secondary | ICD-10-CM

## 2023-11-24 DIAGNOSIS — F17200 Nicotine dependence, unspecified, uncomplicated: Secondary | ICD-10-CM

## 2023-11-24 MED ORDER — LISDEXAMFETAMINE DIMESYLATE 70 MG PO CAPS
70.0000 mg | ORAL_CAPSULE | Freq: Every day | ORAL | 0 refills | Status: DC
  Filled 2023-11-24 – 2023-11-27 (×2): qty 90, 90d supply, fill #0

## 2023-11-27 ENCOUNTER — Encounter (HOSPITAL_COMMUNITY): Payer: Self-pay

## 2023-11-27 ENCOUNTER — Encounter: Payer: Self-pay | Admitting: Nurse Practitioner

## 2023-11-27 ENCOUNTER — Other Ambulatory Visit (HOSPITAL_COMMUNITY): Payer: Self-pay

## 2023-11-27 ENCOUNTER — Other Ambulatory Visit: Payer: Self-pay

## 2023-11-27 ENCOUNTER — Encounter: Payer: Self-pay | Admitting: Internal Medicine

## 2023-11-27 NOTE — Telephone Encounter (Signed)
Please advise 

## 2023-11-28 ENCOUNTER — Other Ambulatory Visit: Payer: Self-pay | Admitting: Internal Medicine

## 2023-11-28 ENCOUNTER — Other Ambulatory Visit (HOSPITAL_COMMUNITY): Payer: Self-pay

## 2023-11-28 ENCOUNTER — Encounter (HOSPITAL_COMMUNITY): Payer: Self-pay

## 2023-11-28 ENCOUNTER — Other Ambulatory Visit: Payer: Self-pay

## 2023-11-28 DIAGNOSIS — F17200 Nicotine dependence, unspecified, uncomplicated: Secondary | ICD-10-CM

## 2023-11-28 DIAGNOSIS — F331 Major depressive disorder, recurrent, moderate: Secondary | ICD-10-CM

## 2023-11-28 DIAGNOSIS — Z716 Tobacco abuse counseling: Secondary | ICD-10-CM

## 2023-11-28 DIAGNOSIS — F909 Attention-deficit hyperactivity disorder, unspecified type: Secondary | ICD-10-CM

## 2023-11-28 MED ORDER — BUPROPION HCL ER (SR) 150 MG PO TB12
150.0000 mg | ORAL_TABLET | Freq: Two times a day (BID) | ORAL | 0 refills | Status: DC
  Filled 2023-11-28: qty 180, 90d supply, fill #0

## 2023-12-04 ENCOUNTER — Other Ambulatory Visit: Payer: Self-pay | Admitting: Family

## 2023-12-14 ENCOUNTER — Ambulatory Visit: Payer: 59 | Admitting: Dermatology

## 2024-01-15 ENCOUNTER — Ambulatory Visit: Payer: 59 | Admitting: Internal Medicine

## 2024-02-08 ENCOUNTER — Telehealth: Admitting: Physician Assistant

## 2024-02-08 ENCOUNTER — Encounter: Payer: Self-pay | Admitting: Physician Assistant

## 2024-02-08 DIAGNOSIS — F17210 Nicotine dependence, cigarettes, uncomplicated: Secondary | ICD-10-CM

## 2024-02-08 DIAGNOSIS — Z716 Tobacco abuse counseling: Secondary | ICD-10-CM

## 2024-02-08 NOTE — Progress Notes (Signed)
 Virtual Visit Consent   Victoria Wright, you are scheduled for a virtual visit with a [facility] provider today. Just as with appointments in the office, your consent must be obtained to participate. Your consent will be active for this visit and any virtual visit you may have with one of our providers in the next 365 days. If you have a MyChart account, a copy of this consent can be sent to you electronically.  As this is a virtual visit, video technology does not allow for your provider to perform a traditional examination. This may limit your provider's ability to fully assess your condition. If your provider identifies any concerns that need to be evaluated in person or the need to arrange testing (such as labs, EKG, etc.), we will make arrangements to do so. Although advances in technology are sophisticated, we cannot ensure that it will always work on either your end or our end. If the connection with a video visit is poor, the visit may have to be switched to a telephone visit. With either a video or telephone visit, we are not always able to ensure that we have a secure connection.  By engaging in this virtual visit, you consent to the provision of healthcare and authorize for your insurance to be billed (if applicable) for the services provided during this visit. Depending on your insurance coverage, you may receive a charge related to this service.  I need to obtain your verbal consent now. Are you willing to proceed with your visit today? Victoria Wright has provided verbal consent on 02/08/2024 for a virtual visit (video or telephone). Piedad Climes, [place]  Date: 02/08/2024 5:54 PM  Virtual Visit via Video Note   I, Piedad Climes, connected with  Victoria Wright  ([id number], 06-May-1991) on 02/08/24 at  6:00 PM EDT by a video-enabled telemedicine application and verified that I am speaking with the correct person using two identifiers.  Location: Patient:  Virtual Visit Location Patient: Home Provider: Virtual Visit Location Provider: Home Office   I discussed the limitations of evaluation and management by telemedicine and the availability of in person appointments. The patient expressed understanding and agreed to proceed.    History of Present Illness: Victoria Wright is wanting to discuss their tobacco use and is seeking assistance with cessation. Patient has been smoking/using around 10 cigarettes per day for the past 15 years. There has been prior attempts to quit with success. Notes prior attempt around 12-13 years ago in which she used Nicoderm patches. Was able to fully quit while on the patch and stayed completely tobacco free for about a month before restarting. States she did not like using the patches because it felt odd to have a medication absorbed through the skin. Also, about 3 years ago was able to successfully quit with use of Wellbutrin XL 300 mg daily. This was given for both her tobacco use and depression. Was able to quit for an entire year before having a relapse (social triggers).   Denies use of other tobacco or non-tobacco nicotine products.  Denies any alcohol consumption.  Denies recreational drug use.   Denies prior formal diagnosis of asthma or COPD. Denies difficulty breathing. Does note some increase mucous production at times, but mainly in the morning.   She is currently on Wellbutrin SR 150 mg BID for depression, ADHD and tobacco use. Notes tolerating without issue but no help with smoking cessation efforts.    Triggers: The following trigger(s) for use  has/have been identified: psychological: after meals, caffeine, house  Withdrawal Symptoms: Identified withdrawal symptoms: irritable mood.  State of Readiness: Patient feels overall Ready to quit. Concerns/Barriers to Cessation - None  Observations/Objective: Patient is well-developed, well-nourished in no acute distress.  Resting comfortably at home.  Head is  normocephalic, atraumatic.  No labored breathing.  Speech is clear and coherent with logical content.  Patient is alert and oriented at baseline.   Assessment and Plan: No problem-specific Assessment & Plan notes found for this encounter.  - Patient is currently at the following State of Change: Action -- involved in a quit attempt - Comorbid conditions identified: MDD, ADHD.  - Reviewed impacts of smoking on patient's health. - Other resources including the [employer] and Department of Health and [employer] -- have been included in the patient's written instructions and sent to their MyChart. - Discussed pharmacotherapy and counseling options. She wants to proceed with being placed back on Wellbutrin XL 300 mg daily. Discussed that while Wellbutrin XL is a possibility giving we would be using to also help with her ADHD and depression, giving she is on higher doses of Viibryd and also taking Vraylar, some increased risk of CNS depression and serotonin syndrome. She does note having palpitations when on multiple medications before but this has not been an issue with her current combination of medications, including the 150 SR BID. Mood is stable. Sleeping well. Some increased appetite without relapse of binge eating.  Reinforced safety and efficacy of NRT products such as Nicoderm which could be added to her current Wellbutrin regimen. She still wants to refrain from NRT if possible despite reassurance.  At this time discussed will consult with her internal medicine physician first regarding current combinations of medications and potential for side effects with higher doses of multiple psychiatric medications. If we make this change, would warrant close monitoring for any side effects and would quickly discontinue if no added benefit in smoking reduction.   - Plan for follow-up via phone/MyChart as soon as I have spoken with her PCP.   Follow Up Instructions: I discussed the  assessment and treatment plan with the patient. The patient was provided an opportunity to ask questions and all were answered. The patient agreed with the plan and demonstrated an understanding of the instructions.  A copy of instructions were sent to the patient via MyChart unless otherwise noted below.   Time:  I have spent 20 minutes with the patient via telehealth technology in tobacco cessation counseling.    Piedad Climes, PA-C

## 2024-02-08 NOTE — Patient Instructions (Signed)
 Victoria Wright, thank you for joining Piedad Climes, PA-C for today's virtual visit.  While this provider is not your primary care provider (PCP), if your PCP is located in our provider database this encounter information will be shared with them immediately following your visit.  Consent: (Patient) Victoria Wright provided verbal consent for this virtual visit at the beginning of the encounter.  Current Medications:  Current Outpatient Medications:    buPROPion (WELLBUTRIN SR) 150 MG 12 hr tablet, Take 1 tablet by mouth daily for 3 days. Then increase to 1 tablet twice daily., Disp: 180 tablet, Rfl: 0   cariprazine (VRAYLAR) 1.5 MG capsule, Take 1 capsule (1.5 mg total) by mouth daily., Disp: 90 capsule, Rfl: 1   paragard intrauterine copper IUD IUD, 1 each by Intrauterine route once., Disp: , Rfl:    Vilazodone HCl (VIIBRYD) 40 MG TABS, Take 1 tablet (40 mg total) by mouth daily., Disp: 90 tablet, Rfl: 1   When you are getting low on your smoking cessation medications, schedule your next video appointment as previously discussed. This way we can follow-up with you, make any necessary adjustments/changes, and get next fill of medication sent in to your pharmacy.   Follow-Up: Call back or seek an in-person evaluation if the symptoms worsen or if the condition fails to improve as anticipated.  Other Instructions  Congratulations for your interest in quitting smoking!  Quitting smoking is one of the most important things you can do to protect your health.  We are here to help you! Did you know that if you quit smoking 1 pack per day you could save up to $2550.00 per year?  Medications are not appropriate for everyone depending upon your situation and any health issues you may have. If we do prescribe medication, it will be for 1 month at time and you will be required to have a follow up Video visit at 1 month to assess how you are doing and if there are any side effects.   This could be for up to a total of 3 months.    Support from your friends, family and work colleagues is very important. Please let them know that you are trying to stop smoking so that they understand your need for support in this goal!  - Remove tobacco products from your environment - Set a quit date ideally within 2 weeks. - You should totally abstain from smoking after your quit date. A single puff could hurt your progress or cause you to relapse - If there are others in your household that smoke, ask them to try to quit or abstain from smoking in your presence  You may notice nicotine withdrawal symptoms such as increased appetite and weight gain, changes in mood, insomnia, irritability and/or anxiety. These symptoms peak in the first three days after smoking cessation and subside over the next 3-4 weeks.   I will call you as soon as I get a response back from Dr. Yetta Barre regarding adjustment in your chronic medications. Then we will schedule a follow-up appt at that time.  A combination of behavioral and medication can improve the success of you quitting smoking. You may want to use the 1-800-QUIT-NOW free support line.  Also, the Department of Health and Human Services provides Smoke free apps for smartphones: SharedCustomer.fi  If you happen to break your plan and have a cigarette, keep taking your medications and continue to try to abstain.  Do not give up!  MAKE SURE YOU  Take any prescribed medications only as instructed.  If you miss a dose of medication, take the next dose when it is due and get back on schedule. DO NOT double up on medications.  Mark your calendar to do your Follow Up Smoking Cessation Visit in one month   If you have been instructed to have an in-person evaluation today at a local Urgent Care facility, please use the link below. It will take you to a list of all of our available [facility], including address, phone number and  hours of operation. Please do not delay care.  [facility]  If you or a family member do not have a primary care provider, use the link below to schedule a visit and establish care. When you choose a [facility] primary care physician or advanced practice provider, you gain a long-term partner in health. Find a Primary Care Provider  Learn more about [facility]'s in-office and virtual care options: [facility] - Get Care Now

## 2024-02-09 ENCOUNTER — Encounter: Payer: Self-pay | Admitting: Physician Assistant

## 2024-02-14 ENCOUNTER — Other Ambulatory Visit (HOSPITAL_COMMUNITY): Payer: Self-pay

## 2024-02-14 MED ORDER — BUPROPION HCL ER (XL) 300 MG PO TB24
300.0000 mg | ORAL_TABLET | Freq: Every day | ORAL | 0 refills | Status: DC
  Filled 2024-02-14: qty 30, 30d supply, fill #0

## 2024-02-14 NOTE — Addendum Note (Signed)
 Addended by: Waldon Merl on: 02/14/2024 11:42 AM   Modules accepted: Orders

## 2024-02-21 ENCOUNTER — Encounter: Payer: Self-pay | Admitting: Physician Assistant

## 2024-02-21 ENCOUNTER — Telehealth: Admitting: Physician Assistant

## 2024-02-21 DIAGNOSIS — F17210 Nicotine dependence, cigarettes, uncomplicated: Secondary | ICD-10-CM

## 2024-02-21 DIAGNOSIS — Z716 Tobacco abuse counseling: Secondary | ICD-10-CM

## 2024-02-21 MED ORDER — BUPROPION HCL ER (XL) 300 MG PO TB24
300.0000 mg | ORAL_TABLET | Freq: Every day | ORAL | 0 refills | Status: DC
  Filled 2024-02-21: qty 30, 30d supply, fill #0

## 2024-02-21 NOTE — Patient Instructions (Signed)
 Victoria Wright, thank you for joining [name], PA-C for today's virtual visit.  While this provider is not your primary care provider (PCP), if your PCP is located in our provider database this encounter information will be shared with them immediately following your visit.  Consent: (Patient) Victoria Wright provided verbal consent for this virtual visit at the beginning of the encounter.  Current Medications:  Current Outpatient Medications:  .  buPROPion (WELLBUTRIN XL) 300 MG 24 hr tablet, Take 1 tablet (300 mg total) by mouth daily., Disp: 30 tablet, Rfl: 0 .  cariprazine (VRAYLAR) 1.5 MG capsule, Take 1 capsule (1.5 mg total) by mouth daily., Disp: 90 capsule, Rfl: 1 .  paragard intrauterine copper IUD IUD, 1 each by Intrauterine route once., Disp: , Rfl:  .  Vilazodone HCl (VIIBRYD) 40 MG TABS, Take 1 tablet (40 mg total) by mouth daily., Disp: 90 tablet, Rfl: 1   When you are getting low on your smoking cessation medications, schedule your next video appointment as previously discussed. This way we can follow-up with you, make any necessary adjustments/changes, and get next fill of medication sent in to your pharmacy.   Follow-Up: Call back or seek an in-person evaluation if the symptoms worsen or if the condition fails to improve as anticipated.  Other Instructions  Congratulations for your interest in quitting smoking!  Quitting smoking is one of the most important things you can do to protect your health.  We are here to help you! Did you know that if you quit smoking 1 pack per day you could save up to $2550.00 per year?  Medications are not appropriate for everyone depending upon your situation and any health issues you may have. If we do prescribe medication, it will be for 1 month at time and you will be required to have a follow up Video visit at 1 month to assess how you are doing and if there are any side effects.  This could be for up to a total of 3  months.    Support from your friends, family and work colleagues is very important. Please let them know that you are trying to stop smoking so that they understand your need for support in this goal!  - Remove tobacco products from your environment - Set a quit date ideally within 2 weeks. - You should totally abstain from smoking after your quit date. A single puff could hurt your progress or cause you to relapse - If there are others in your household that smoke, ask them to try to quit or abstain from smoking in your presence  You may notice nicotine withdrawal symptoms such as increased appetite and weight gain, changes in mood, insomnia, irritability and/or anxiety. These symptoms peak in the first three days after smoking cessation and subside over the next 3-4 weeks.     A combination of behavioral and medication can improve the success of you quitting smoking. You may want to use the 1-800-QUIT-NOW free support line.  Also, the Department of Health and Human Services provides Smoke free apps for smartphones: SharedCustomer[employer]fi  If you happen to break your plan and have a cigarette, keep taking your medications and continue to try to abstain.  Do not give up!  MAKE SURE YOU  Take any prescribed medications only as instructed.  If you miss a dose of medication, take the next dose when it is due and get back on schedule. DO NOT double up on medications.  [name]  your calendar to do your Follow Up Smoking Cessation Visit in one month   If you have been instructed to have an in-person evaluation today at a local Urgent Care facility, please use the link below. It will take you to a list of all of our available [facility], including address, phone number and hours of operation. Please do not delay care.  [facility]  If you or a family member do not have a primary care provider, use the link below to schedule a visit and establish care. When you  choose a [facility] primary care physician or advanced practice provider, you gain a long-term partner in health. Find a Primary Care Provider  Learn more about [facility]'s in-office and virtual care options:  - Get Care Now

## 2024-02-21 NOTE — Progress Notes (Addendum)
 Virtual Visit Consent  Victoria Wright, you are scheduled for a virtual visit with a [facility] provider today. Just as with appointments in the office, your consent must be obtained to participate. Your consent will be active for this visit and any virtual visit you may have with one of our providers in the next 365 days. If you have a MyChart account, a copy of this consent can be sent to you electronically.  As this is a virtual visit, video technology does not allow for your provider to perform a traditional examination. This may limit your provider's ability to fully assess your condition. If your provider identifies any concerns that need to be evaluated in person or the need to arrange testing (such as labs, EKG, etc.), we will make arrangements to do so. Although advances in technology are sophisticated, we cannot ensure that it will always work on either your end or our end. If the connection with a video visit is poor, the visit may have to be switched to a telephone visit. With either a video or telephone visit, we are not always able to ensure that we have a secure connection.  By engaging in this virtual visit, you consent to the provision of healthcare and authorize for your insurance to be billed (if applicable) for the services provided during this visit. Depending on your insurance coverage, you may receive a charge related to this service.  I need to obtain your verbal consent now. Are you willing to proceed with your visit today? Victoria Wright has provided verbal consent on 02/21/2024 for a virtual visit (video or telephone). Piedad Climes, [place]  Date: 02/21/2024 6:09 PM  Virtual Visit via Video Note  I, Piedad Climes, connected with  Victoria Wright  ([id number], Dec 31, 1990) on 02/21/24 at  6:00 PM EDT by a video-enabled telemedicine application and verified that I am speaking with the correct person using two identifiers.  Location: Patient:  Virtual Visit Location Patient: Home Provider: Virtual Visit Location Provider: Home Office   I discussed the limitations of evaluation and management by telemedicine and the availability of in person appointments. The patient expressed understanding and agreed to proceed.    History of Present Illness: Victoria Wright is presenting for follow-up regarding smoking cessation efforts. At last visit 4/3, the dose of Wellbutrin she was taking for tobacco dependence from her PCP was adjusted to XL 300 mg to get further control over tobacco use but also her ADHD, depression.   Notes taking the medication as directed and tolerating very well. Has already noted improvement in cravings and appetite. Notes mood has been stable and noting better sleep and concentration. Is down from 10 cigarettes per day to 3 cigarettes total per day, but only smoking about 1/2 each cigarette.    Triggers: The following trigger(s) for use has/have been identified: psychological: stress  Withdrawal Symptoms: Identified withdrawal symptoms: irritable mood.   Observations/Objective: Patient is well-developed, well-nourished in no acute distress.  Resting comfortably  at home.  Head is normocephalic, atraumatic.  No labored breathing.  Speech is clear and coherent with logical content.  Patient is alert and oriented at baseline.   Assessment and Plan: Nicotine dependence, cigarettes, uncomplicated - Plan: buPROPion (WELLBUTRIN XL) 300 MG 24 hr tablet   - Patient is currently at the following State of Change: Action -- involved in a quit attempt - Reviewed impacts of smoking on patient's health. - Quit date set for one week. - Continue Wellbutrin XL daily.  -  Other resources including the [employer] and Department of Health and [employer] -- have been included in the patient's written instructions and sent to their MyChart.  - Plan for follow-up in 4 weeks.  Sooner if needed.  Follow Up Instructions: I  discussed the assessment and treatment plan with the patient. The patient was provided an opportunity to ask questions and all were answered. The patient agreed with the plan and demonstrated an understanding of the instructions.  A copy of instructions were sent to the patient via MyChart unless otherwise noted below.   Time:  I have spent 11 minutes with the patient via telehealth technology in tobacco cessation counseling.    [name], PA-C

## 2024-02-22 ENCOUNTER — Other Ambulatory Visit (HOSPITAL_COMMUNITY): Payer: Self-pay

## 2024-02-26 ENCOUNTER — Other Ambulatory Visit: Payer: Self-pay | Admitting: Internal Medicine

## 2024-02-26 DIAGNOSIS — F331 Major depressive disorder, recurrent, moderate: Secondary | ICD-10-CM

## 2024-03-05 ENCOUNTER — Encounter (HOSPITAL_COMMUNITY): Payer: Self-pay

## 2024-03-06 ENCOUNTER — Other Ambulatory Visit (HOSPITAL_COMMUNITY): Payer: Self-pay

## 2024-03-06 ENCOUNTER — Telehealth: Payer: Self-pay

## 2024-03-06 ENCOUNTER — Encounter: Payer: Self-pay | Admitting: Internal Medicine

## 2024-03-06 NOTE — Telephone Encounter (Signed)
 Copied from CRM [id number]. Topic: Clinical - Prescription Issue >> Mar 06, 2024 12:37 PM Victoria Wright wrote: Reason for CRM: PT need Vilazodone  HCl (VIIBRYD ) 40 MG TABS & Cariprazine  HCl 1.5 mg Oral Daily as she is currently out of medication

## 2024-03-07 NOTE — Telephone Encounter (Signed)
 Copied from CRM [id number]. Topic: Clinical - Prescription Issue >> Mar 06, 2024 12:37 PM Clyde Darling P wrote: Reason for CRM: PT need Vilazodone  HCl (VIIBRYD ) 40 MG TABS & Cariprazine  HCl 1.5 mg Oral Daily as she is currently out of medication >> Mar 07, 2024  2:19 PM Allyne Areola wrote: Patient is calling to follow up on the medication request. She followed up with the pharmacy and they have not received anything.

## 2024-03-08 ENCOUNTER — Other Ambulatory Visit: Payer: Self-pay | Admitting: Internal Medicine

## 2024-03-08 ENCOUNTER — Other Ambulatory Visit (HOSPITAL_COMMUNITY): Payer: Self-pay

## 2024-03-08 DIAGNOSIS — F331 Major depressive disorder, recurrent, moderate: Secondary | ICD-10-CM

## 2024-03-08 NOTE — Telephone Encounter (Signed)
This has been handled via MyChart.

## 2024-03-11 ENCOUNTER — Other Ambulatory Visit (HOSPITAL_COMMUNITY): Payer: Self-pay

## 2024-03-11 ENCOUNTER — Other Ambulatory Visit: Payer: Self-pay

## 2024-03-11 ENCOUNTER — Ambulatory Visit: Admitting: Family Medicine

## 2024-03-11 DIAGNOSIS — F331 Major depressive disorder, recurrent, moderate: Secondary | ICD-10-CM

## 2024-03-11 MED ORDER — VILAZODONE HCL 40 MG PO TABS
40.0000 mg | ORAL_TABLET | Freq: Every day | ORAL | 1 refills | Status: DC
  Filled 2024-03-11: qty 90, 90d supply, fill #0
  Filled 2024-06-03: qty 90, 90d supply, fill #1

## 2024-03-11 MED ORDER — CARIPRAZINE HCL 1.5 MG PO CAPS
1.5000 mg | ORAL_CAPSULE | Freq: Every day | ORAL | 1 refills | Status: DC
  Filled 2024-03-11: qty 90, 90d supply, fill #0
  Filled 2024-06-03: qty 90, 90d supply, fill #1

## 2024-03-11 NOTE — Progress Notes (Signed)
 Assessment & Plan:   Assessment & Plan Moderate episode of recurrent major depressive disorder (HCC) Well controlled on current regimen.  Continue Vraylar  1.5 mg daily and Viibryd  40 mg daily.   Return in about 6 months (around 09/11/2024) for chronic follow-up with PCP .  Hershel Los, MSN, APRN, FNP-C  Subjective:      HPI: Victoria Wright is a 33 y.o. female presenting on 03/11/2024 for Medical Management of Chronic Issues (Pt requesting refill of Vraylar  1.5mg  and Viibryd  40mg ) . Patient is here for a refill of Vraylar  and Viibryd .  She last saw her provider in September and was under the impression she was good for one year since she has nothing going on, however when she requested a refill she was told she was overdue for an appointment.     03/11/2024    3:34 PM 09/28/2023    3:37 PM 07/17/2023   10:59 AM  Depression screen PHQ 2/9  Decreased Interest 0 2 0  Down, Depressed, Hopeless 0 0 0  PHQ - 2 Score 0 2 0  Altered sleeping 0 0 0  Tired, decreased energy 0 2 0  Change in appetite 0 2 0  Feeling bad or failure about yourself  0 0 0  Trouble concentrating 0 0 0  Moving slowly or fidgety/restless 0 0 0  Suicidal thoughts 0 0 0  PHQ-9 Score 0 6 0  Difficult doing work/chores  Somewhat difficult       03/11/2024    3:34 PM  GAD 7 : Generalized Anxiety Score  Nervous, Anxious, on Edge 0  Control/stop worrying 0  Worry too much - different things 0  Trouble relaxing 0  Restless 0  Easily annoyed or irritable 0  Afraid - awful might happen 0  Total GAD 7 Score 0    New complaints: None   Social history:  Relevant past medical, surgical, family and social history reviewed and updated as indicated. Interim medical history since our last visit reviewed.  Allergies and medications reviewed and updated.  DATA REVIEWED: CHART IN EPIC  ROS: Negative unless specifically indicated above in HPI.    Current Outpatient Medications:    buPROPion   (WELLBUTRIN  XL) 300 MG 24 hr tablet, Take 1 tablet (300 mg total) by mouth daily., Disp: 30 tablet, Rfl: 0   paragard intrauterine copper IUD IUD, 1 each by Intrauterine route once., Disp: , Rfl:    cariprazine  (VRAYLAR ) 1.5 MG capsule, Take 1 capsule (1.5 mg total) by mouth daily., Disp: 90 capsule, Rfl: 1   Vilazodone  HCl (VIIBRYD ) 40 MG TABS, Take 1 tablet (40 mg total) by mouth daily., Disp: 90 tablet, Rfl: 1      Objective:    BP 118/70   Temp 98.8 F (37.1 C)   Ht 5\' 7"  (1.702 m)   LMP 02/19/2024   BMI 36.65 kg/m   Wt Readings from Last 3 Encounters:  09/28/23 234 lb (106.1 kg)  07/17/23 235 lb 6 oz (106.8 kg)  03/13/23 225 lb (102.1 kg)    Physical Exam Vitals reviewed.  Constitutional:      General: She is not in acute distress.    Appearance: Normal appearance. She is not ill-appearing, toxic-appearing or diaphoretic.  HENT:     Head: Normocephalic and atraumatic.  Eyes:     General: No scleral icterus.       Right eye: No discharge.        Left eye: No discharge.  Conjunctiva/sclera: Conjunctivae normal.  Cardiovascular:     Rate and Rhythm: Normal rate.  Pulmonary:     Effort: Pulmonary effort is normal. No respiratory distress.  Musculoskeletal:        General: Normal range of motion.     Cervical back: Normal range of motion.  Skin:    General: Skin is warm and dry.     Capillary Refill: Capillary refill takes less than 2 seconds.  Neurological:     General: No focal deficit present.     Mental Status: She is alert and oriented to person, place, and time. Mental status is at baseline.  Psychiatric:        Mood and Affect: Mood normal.        Behavior: Behavior normal.        Thought Content: Thought content normal.        Judgment: Judgment normal.

## 2024-03-11 NOTE — Assessment & Plan Note (Signed)
 Well controlled on current regimen.  Continue Vraylar  1.5 mg daily and Viibryd  40 mg daily.

## 2024-03-19 ENCOUNTER — Ambulatory Visit: Admitting: Internal Medicine

## 2024-03-20 ENCOUNTER — Telehealth: Admitting: Physician Assistant

## 2024-03-20 ENCOUNTER — Encounter: Payer: Self-pay | Admitting: Internal Medicine

## 2024-03-20 DIAGNOSIS — F17210 Nicotine dependence, cigarettes, uncomplicated: Secondary | ICD-10-CM

## 2024-03-20 MED ORDER — NICOTINE 14 MG/24HR TD PT24
14.0000 mg | MEDICATED_PATCH | Freq: Every day | TRANSDERMAL | 0 refills | Status: DC
Start: 2024-03-20 — End: 2024-06-14
  Filled 2024-03-20: qty 28, 28d supply, fill #0

## 2024-03-20 NOTE — Patient Instructions (Addendum)
 Victoria Wright, thank you for joining [name], PA-C for today's virtual visit.  While this provider is not your primary care provider (PCP), if your PCP is located in our provider database this encounter information will be shared with them immediately following your visit.   A [place] MyChart account gives you access to today's visit and all your visits, tests, and labs performed at [facility] " click here if you don't have a [place] MyChart account or go to [email or web address]  Consent: (Patient) Victoria Wright provided verbal consent for this virtual visit at the beginning of the encounter.  Current Medications:  Current Outpatient Medications:    buPROPion  (WELLBUTRIN  XL) 300 MG 24 hr tablet, Take 1 tablet (300 mg total) by mouth daily., Disp: 30 tablet, Rfl: 0   cariprazine  (VRAYLAR ) 1.5 MG capsule, Take 1 capsule (1.5 mg total) by mouth daily., Disp: 90 capsule, Rfl: 1   paragard intrauterine copper IUD IUD, 1 each by Intrauterine route once., Disp: , Rfl:    Vilazodone  HCl (VIIBRYD ) 40 MG TABS, Take 1 tablet (40 mg total) by mouth daily., Disp: 90 tablet, Rfl: 1   Medications ordered in this encounter:  No orders of the defined types were placed in this encounter.    *If you need refills on other medications prior to your next appointment, please contact your pharmacy*  Follow-Up: Call back or seek an in-person evaluation if the symptoms worsen or if the condition fails to improve as anticipated.  [facility] [phone]  Other Instructions   Giving < 10 cigarettes per day, will start 14 mg dose Nicoderm changing daily. Quit date to be reset as date of starting the patch.  We will plan for check-in via messaging in 2 weeks to see how things are going. Will set next formal follow-up at that time. Make sure to call your behavioral health provider as discussed.    Tobacco Use Disorder Tobacco use disorder (TUD)  occurs when a person craves, seeks, and uses tobacco, regardless of the consequences. This disorder can cause problems with mental and physical health. It can affect your ability to have healthy relationships. It can also keep you from meeting your responsibilities at work, home, or school. Tobacco products contain a dangerous chemical called nicotine. Nicotine triggers hormones that make the body feel stimulated and works on areas of the brain that make a person feel good. These effects can make the person depend on nicotine, which makes it hard to quit tobacco. Tobacco may be: Smoked as a cigarette or cigar. Inhaled using vaping devices, such as e-cigarettes. Smoked in a pipe or hookah. Chewed as smokeless tobacco. Inhaled into the nostrils as snuff. What are the causes? This condition is caused by using nicotine. Nicotine causes changes in your brain that make you want more and more. This is called addiction. This can make it hard to stop using tobacco once you start. What are the signs or symptoms? Symptoms of TUD may include: Being unable to stop your tobacco use even after planned quit attempts. Spending an abnormal amount of time getting or using tobacco. Craving tobacco. Tobacco use that: Interferes with your work, school, or home life. Interferes with your personal and social relationships. Makes you give up activities that you once enjoyed or found important. Using tobacco even though you know that it is: Dangerous or bad for your health or someone else's health. Causing problems in your life. Needing more and more of the  substance to get the same effect (developing tolerance). Using the substance to avoid unpleasant symptoms if you do not use the substance (withdrawal). How is this diagnosed? This condition may be diagnosed based on: Your current and past tobacco use. Your health care provider may ask questions about how your tobacco use affects your life. A physical exam. You  may be diagnosed with TUD if you have at least two symptoms within a 38-month period. How is this treated? This condition is treated by stopping tobacco use. Many people are unable to quit on their own and need help. Treatment may include: Nicotine replacement therapy (NRT). NRT provides nicotine without the other harmful chemicals in tobacco. NRT gradually lowers the dosage of nicotine in the body and reduces withdrawal symptoms. NRT is available as: Over-the-counter gums, lozenges, and skin patches. Prescription mouth inhalers and nasal sprays. Medicine that acts on the brain to reduce cravings and withdrawal symptoms. A type of talk therapy that examines your triggers for tobacco use, how to avoid them, and how to cope with cravings (behavioral therapy). Hypnosis. This may help with withdrawal symptoms. Joining a support group for people coping with TUD. The best treatment for TUD is usually a combination of medicine, talk therapy, and support groups. Recovery can be a long process. Many people start using tobacco again after stopping (relapse). If you relapse, it does not mean that treatment will not work. Follow these instructions at home:  Lifestyle Do not use any products that contain nicotine or tobacco. These products include cigarettes, chewing tobacco, and vaping devices, such as e-cigarettes. If you need help quitting, ask your health care provider. Avoid things that trigger tobacco use as much as you can. Triggers include people and situations that usually cause you to use tobacco. Avoid drinks that contain caffeine, including coffee. These may worsen some withdrawal symptoms. Find ways to manage stress. Wanting to smoke may cause stress, and stress can make you want to smoke. Relaxation techniques such as deep breathing, meditation, and yoga may help. Attend support groups as needed. These groups are an important part of long-term recovery for many people. General instructions Take  over-the-counter and prescription medicines only as told by your health care provider. Check with your health care provider before taking any new prescription or over-the-counter medicines. Decide on a friend, family member, or smoking quit-line (such as 1-800-QUIT-NOW in the [place] that you can call or text when you feel the urge to smoke or when you need help coping with cravings. Keep all follow-up visits. This is important. Contact a health care provider if: You are not able to take your medicines as told by your health care provider. Your symptoms get worse, even with treatment. Summary Tobacco use disorder (TUD) occurs when a person craves, seeks, and uses tobacco regardless of the consequences. This condition may be diagnosed based on your current and past tobacco use and a physical exam. Many people are unable to quit on their own and need help. Recovery can be a long process. The most effective treatment for TUD is usually a combination of medicine, talk therapy, and support groups. This information is not intended to replace advice given to you by your health care provider. Make sure you discuss any questions you have with your health care provider. Document Revised: 10/19/2021 Document Reviewed: 10/19/2021 Elsevier Patient Education  2024 [employer].   If you have been instructed to have an in-person evaluation today at a local Urgent Care facility, please use the link  below. It will take you to a list of all of our available [facility], including address, phone number and hours of operation. Please do not delay care.  [facility]  If you or a family member do not have a primary care provider, use the link below to schedule a visit and establish care. When you choose a [facility] primary care physician or advanced practice provider, you gain a long-term partner in health. Find a Primary Care Provider  Learn more about [place]'s in-office and virtual  care options: [place] - Get Care Now

## 2024-03-20 NOTE — Progress Notes (Signed)
 Virtual Visit Consent   [name], you are scheduled for a virtual visit with a [facility] provider today. Just as with appointments in the office, your consent must be obtained to participate. Your consent will be active for this visit and any virtual visit you may have with one of our providers in the next 365 days. If you have a MyChart account, a copy of this consent can be sent to you electronically.  As this is a virtual visit, video technology does not allow for your provider to perform a traditional examination. This may limit your provider's ability to fully assess your condition. If your provider identifies any concerns that need to be evaluated in person or the need to arrange testing (such as labs, EKG, etc.), we will make arrangements to do so. Although advances in technology are sophisticated, we cannot ensure that it will always work on either your end or our end. If the connection with a video visit is poor, the visit may have to be switched to a telephone visit. With either a video or telephone visit, we are not always able to ensure that we have a secure connection.  By engaging in this virtual visit, you consent to the provision of healthcare and authorize for your insurance to be billed (if applicable) for the services provided during this visit. Depending on your insurance coverage, you may receive a charge related to this service.  I need to obtain your verbal consent now. Are you willing to proceed with your visit today? Victoria Wright has provided verbal consent on 03/20/2024 for a virtual visit (video or telephone). Victoria Wright, [place]  Date: 03/20/2024 6:13 PM   Virtual Visit via Video Note   I, Victoria Wright, connected with  Cia Sharf  ([id number], 1991/04/24) on 03/20/24 at  6:00 PM EDT by a video-enabled telemedicine application and verified that I am speaking with the correct person using two identifiers.  Location: Patient:  Virtual Visit Location Patient: Home Provider: Virtual Visit Location Provider: Home Office   I discussed the limitations of evaluation and management by telemedicine and the availability of in person appointments. The patient expressed understanding and agreed to proceed.    History of Present Illness: Victoria Wright is a 33 y.o. who identifies as a female who was assigned female at birth, and is being seen today for follow-up regarding tobacco/nicotine use. At last visit she had been able to mostly quit smoking, was down to just an occasional cigarette. She was continued on her Wellbutrin . Notes over the past few weeks she started noting some brain fog and dizziness. Was concerned was related to Wellbutrin  so she stopped the medication. Notes despite stopping the medication she still notes that issue, particularly if she is slightly late taking her Vraylar  or Vilazodone . Denies SI/HI. Has not followed up with her [facility] provider regarding this. Has now increased intake up to 6-8 cigarettes per day.   BP Readings from Last 3 Encounters:  03/11/24 118/70  09/28/23 110/64  07/17/23 124/82    HPI: HPI  Problems:  Patient Active Problem List   Diagnosis Date Noted   Well woman exam with routine gynecological exam 09/28/2023   IUD (intrauterine device) in place 09/28/2023   Need for prophylactic vaccination and inoculation against influenza 07/17/2023   Melasma 07/17/2023   Cervical cancer screening 07/17/2023   Tachycardia 03/13/2023   Moderate episode of recurrent major depressive disorder (HCC) 03/13/2023   Class 2 severe obesity due  to excess calories with serious comorbidity and body mass index (BMI) of 36.0 to 36.9 in adult [facility]) 03/13/2023   Loud snoring 03/13/2023   Binge eating disorder 07/19/2022   ADHD 05/13/2022   Encounter for general adult medical examination with abnormal findings 06/28/2021   Current smoker 01/09/2018    Allergies:  Allergies  Allergen Reactions    Cephalexin  Itching   Amoxicillin Rash and Other (See Comments)    Has patient had a PCN reaction causing immediate rash, facial/tongue/throat swelling, SOB or lightheadedness with hypotension: No Has patient had a PCN reaction causing severe rash involving mucus membranes or skin necrosis: No Has patient had a PCN reaction that required hospitalization No Has patient had a PCN reaction occurring within the last 10 years: No If all of the above answers are "NO", then may proceed with Cephalosporin use.   Prednisone Rash   Medications:  Current Outpatient Medications:    nicotine (NICODERM CQ - DOSED IN MG/24 HOURS) 14 mg/24hr patch, Place 1 patch (14 mg total) onto the skin daily., Disp: 28 patch, Rfl: 0   cariprazine  (VRAYLAR ) 1.5 MG capsule, Take 1 capsule (1.5 mg total) by mouth daily., Disp: 90 capsule, Rfl: 1   paragard intrauterine copper IUD IUD, 1 each by Intrauterine route once., Disp: , Rfl:    Vilazodone  HCl (VIIBRYD ) 40 MG TABS, Take 1 tablet (40 mg total) by mouth daily., Disp: 90 tablet, Rfl: 1  Observations/Objective: Patient is well-developed, well-nourished in no acute distress.  Resting comfortably  at home.  Head is normocephalic, atraumatic.  No labored breathing.  Speech is clear and coherent with logical content.  Patient is alert and oriented at baseline.   Assessment and Plan: 1. Cigarette nicotine dependence without complication (Primary) - nicotine (NICODERM CQ - DOSED IN MG/24 HOURS) 14 mg/24hr patch; Place 1 patch (14 mg total) onto the skin daily.  Dispense: 28 patch; Refill: 0  At current state, will refrain from restarting Wellbutrin  until she can speak with her [facility] providers regarding chronic mood medications. Discussed use of long acting NRT therapy for now since she has used int he past. She is agreeable. Giving < 10 cigarettes per day, will start 14 mg dose changing daily. Quit date to be reset as date of starting NRT. Plan for check in via messaging in 2  weeks to see how things are going. Will set next formal follow-up at that time.   Follow Up Instructions: I discussed the assessment and treatment plan with the patient. The patient was provided an opportunity to ask questions and all were answered. The patient agreed with the plan and demonstrated an understanding of the instructions.  A copy of instructions were sent to the patient via MyChart unless otherwise noted below.   The patient was advised to call back or seek an in-person evaluation if the symptoms worsen or if the condition fails to improve as anticipated.    Victoria Maillard, PA-C

## 2024-03-21 ENCOUNTER — Other Ambulatory Visit: Payer: Self-pay

## 2024-03-21 ENCOUNTER — Other Ambulatory Visit (HOSPITAL_COMMUNITY): Payer: Self-pay

## 2024-06-03 ENCOUNTER — Other Ambulatory Visit (HOSPITAL_COMMUNITY): Payer: Self-pay

## 2024-06-14 ENCOUNTER — Encounter: Payer: Self-pay | Admitting: Physician Assistant

## 2024-06-14 ENCOUNTER — Ambulatory Visit: Admitting: Physician Assistant

## 2024-06-14 VITALS — BP 140/88 | HR 96 | Ht 67.0 in | Wt 230.0 lb

## 2024-06-14 DIAGNOSIS — R229 Localized swelling, mass and lump, unspecified: Secondary | ICD-10-CM | POA: Diagnosis not present

## 2024-06-14 NOTE — Progress Notes (Signed)
      Established patient visit   Patient: Victoria Wright   DOB: [birth year]   33 y.o. Female  MRN: [id number] Visit Date: 06/14/2024  Today's healthcare provider: Manuelita Flatness, PA-C   Cc. mass  Subjective     Pt had a fall, June 29th , slipped down the steps, fell on bottom. She then felt a large, tender lump on the area of her right buttock where she fell. Reports area is numb, but also painful. Has not gotten smaller. Uncomfortable to sit.    Medications: Outpatient Medications Prior to Visit  Medication Sig   cariprazine  (VRAYLAR ) 1.5 MG capsule Take 1 capsule (1.5 mg total) by mouth daily.   paragard intrauterine copper IUD IUD 1 each by Intrauterine route once.   Vilazodone  HCl (VIIBRYD ) 40 MG TABS Take 1 tablet (40 mg total) by mouth daily.   [DISCONTINUED] albuterol  (PROVENTIL  HFA;VENTOLIN  HFA) 108 (90 Base) MCG/ACT inhaler Inhale 2 puffs into the lungs every 6 (six) hours as needed for wheezing or shortness of breath. (Patient not taking: Reported on 03/11/2024)   [DISCONTINUED] Cetirizine  HCl 10 MG CAPS Take 1 capsule (10 mg total) by mouth daily. (Patient not taking: Reported on 03/11/2024)   [DISCONTINUED] etonogestrel -ethinyl estradiol  (NUVARING) 0.12-0.015 MG/24HR vaginal ring Insert vaginally and leave in place for 3 consecutive weeks, then remove for 1 week. (Patient not taking: No sig reported)   [DISCONTINUED] nicotine  (NICODERM CQ  - DOSED IN MG/24 HOURS) 14 mg/24hr patch Place 1 patch (14 mg total) onto the skin daily.   No facility-administered medications prior to visit.    Review of Systems  Constitutional:  Negative for fatigue and fever.  Respiratory:  Negative for cough and shortness of breath.   Cardiovascular:  Negative for chest pain and leg swelling.  Gastrointestinal:  Negative for abdominal pain.  Neurological:  Negative for dizziness and headaches.       Objective    BP (!) 140/88   Pulse 96   Ht 5' 7 (1.702 m)   Wt 230 lb (104.3 kg)    BMI 36.02 kg/m    Physical Exam Vitals reviewed.  Constitutional:      Appearance: She is not ill-appearing.  HENT:     Head: Normocephalic.  Eyes:     Conjunctiva/sclera: Conjunctivae normal.  Cardiovascular:     Rate and Rhythm: Normal rate.  Pulmonary:     Effort: Pulmonary effort is normal. No respiratory distress.  Skin:    Comments: Right buttock there is a large, 6-7 cm soft mass, immobile  Neurological:     Mental Status: She is alert and oriented to person, place, and time.  Psychiatric:        Mood and Affect: Mood normal.        Behavior: Behavior normal.      No results found for any visits on 06/14/24.  Assessment & Plan    Localized superficial swelling, mass, or lump -     US  SOFT TISSUE LOWER EXTREMITY LIMITED RIGHT (NON-VASCULAR)   Recommending US  to confirm hematoma vs lipoma, with likely referral to gen surg.  Return if symptoms worsen or fail to improve.       Manuelita Flatness, PA-C  [facility] at [facility] [phone] (phone) [phone] (fax)  [facility]

## 2024-06-19 ENCOUNTER — Telehealth: Payer: Self-pay | Admitting: Physician Assistant

## 2024-06-19 ENCOUNTER — Encounter: Payer: Self-pay | Admitting: Physician Assistant

## 2024-06-19 ENCOUNTER — Ambulatory Visit (INDEPENDENT_AMBULATORY_CARE_PROVIDER_SITE_OTHER): Admitting: Physician Assistant

## 2024-06-19 ENCOUNTER — Ambulatory Visit (HOSPITAL_BASED_OUTPATIENT_CLINIC_OR_DEPARTMENT_OTHER)
Admission: RE | Admit: 2024-06-19 | Discharge: 2024-06-19 | Disposition: A | Discharge status: Home / Self Care | Source: Ambulatory Visit | Attending: Physician Assistant | Admitting: Physician Assistant

## 2024-06-19 ENCOUNTER — Ambulatory Visit: Admitting: Internal Medicine

## 2024-06-19 DIAGNOSIS — R229 Localized swelling, mass and lump, unspecified: Secondary | ICD-10-CM

## 2024-06-19 DIAGNOSIS — T148XXA Other injury of unspecified body region, initial encounter: Secondary | ICD-10-CM | POA: Diagnosis not present

## 2024-06-19 DIAGNOSIS — R222 Localized swelling, mass and lump, trunk: Secondary | ICD-10-CM | POA: Diagnosis not present

## 2024-06-19 MED ORDER — KETOROLAC TROMETHAMINE 30 MG/ML IJ SOLN
30.0000 mg | Freq: Once | INTRAMUSCULAR | Status: AC
  Administered 2024-06-19 (×2): 30 mg via INTRAMUSCULAR

## 2024-06-19 NOTE — Progress Notes (Signed)
      Established patient visit   Patient: Victoria Wright   DOB: 11-21-1990   33 y.o. Female  MRN: [id number] Visit Date: 06/19/2024  Today's healthcare provider: Manuelita Flatness, PA-C   Cc. Pain, buttock mass  Subjective     Pt reports worsening pain associated with the mass on her right buttock. She has intermittently taken tylenol  with no relief. Her ultrasound is not scheduled until 06/26/24.   She denies taking any otc pain meds today.  Medications: Outpatient Medications Prior to Visit  Medication Sig   cariprazine  (VRAYLAR ) 1.5 MG capsule Take 1 capsule (1.5 mg total) by mouth daily.   paragard intrauterine copper IUD IUD 1 each by Intrauterine route once.   Vilazodone  HCl (VIIBRYD ) 40 MG TABS Take 1 tablet (40 mg total) by mouth daily.   No facility-administered medications prior to visit.    Review of Systems  Constitutional:  Negative for fatigue and fever.  Respiratory:  Negative for cough and shortness of breath.   Cardiovascular:  Negative for chest pain and leg swelling.  Gastrointestinal:  Negative for abdominal pain.  Neurological:  Negative for dizziness and headaches.       Objective    There were no vitals taken for this visit.   Physical Exam Vitals reviewed.  Constitutional:      Appearance: She is not ill-appearing.  HENT:     Head: Normocephalic.  Eyes:     Conjunctiva/sclera: Conjunctivae normal.  Cardiovascular:     Rate and Rhythm: Normal rate.  Pulmonary:     Effort: Pulmonary effort is normal. No respiratory distress.  Neurological:     Mental Status: She is alert and oriented to person, place, and time.  Psychiatric:        Mood and Affect: Mood normal.        Behavior: Behavior normal.      No results found for any visits on 06/19/24.  Assessment & Plan    Localized superficial swelling, mass, or lump -     Ketorolac  Tromethamine    Ordered stat US , appears as hematoma will wait for rad review. Deep hematoma  probable [name] lesion.  Referring to gen surg for aspiration Given IM toradol  30 mg today w/ caution given SSRI use.  Will monitor pain relief and prescribe additional pain control as needed.  Return if symptoms worsen or fail to improve.       Manuelita Flatness, PA-C  [facility] at [facility] [phone] (phone) [phone] (fax)  [facility]

## 2024-06-19 NOTE — Telephone Encounter (Signed)
 Pt called, reports worsening pain with movement now, when this area was previously just uncomfortable. Ultrasound 8/20, she feels she cannot wait that long. Ordering stat ultrasound downstairs today.   Will see if pt can be seen today for IM toradol  to give some relief.

## 2024-06-25 ENCOUNTER — Ambulatory Visit: Admitting: Internal Medicine

## 2024-06-25 ENCOUNTER — Encounter: Payer: Self-pay | Admitting: General Surgery

## 2024-06-25 DIAGNOSIS — T148XXA Other injury of unspecified body region, initial encounter: Secondary | ICD-10-CM | POA: Diagnosis not present

## 2024-06-26 ENCOUNTER — Other Ambulatory Visit

## 2024-06-27 ENCOUNTER — Telehealth (HOSPITAL_COMMUNITY): Payer: Self-pay

## 2024-06-27 ENCOUNTER — Other Ambulatory Visit (HOSPITAL_COMMUNITY): Payer: Self-pay | Admitting: General Surgery

## 2024-06-27 DIAGNOSIS — T148XXA Other injury of unspecified body region, initial encounter: Secondary | ICD-10-CM

## 2024-06-27 NOTE — Telephone Encounter (Signed)
-----   Message from Dayne Daniel Hassell sent at 06/27/2024 10:25 AM EDT ----- Regarding: RE: Aspiration? Ok  US  aspiration R gluteal collection  DDH ----- Message ----- From: Carolee Rosina BIRCH Sent: 06/26/2024  11:33 AM EDT To: [facility] Procedure Requests Subject: Aspiration?                                    Procedure: Aspiration  Dx: [name] lesion  Ordering: Dr. Richerd Silversmith [phone]  Imaging: Ultrasound in Epic  Please review.   Thanks,  [place]

## 2024-06-28 ENCOUNTER — Other Ambulatory Visit: Payer: Self-pay | Admitting: Radiology

## 2024-06-28 ENCOUNTER — Encounter: Payer: Self-pay | Admitting: Internal Medicine

## 2024-06-28 DIAGNOSIS — T148XXA Other injury of unspecified body region, initial encounter: Secondary | ICD-10-CM

## 2024-06-28 NOTE — H&P (Signed)
 Chief Complaint: [name] lesion right buttocks; referred for image guided aspiration of right buttocks lesion  Referring Provider(s): Burton,V  Supervising Physician: Luverne Aran  Patient Status: [facility] - [facility]  History of Present Illness: [facility] is a 33 y.o. female smoker with PMH sig for anxiety/depression, hypotension who slipped down the steps on June 29th of this year and developed a large tender lump on her right buttocks.  Due to persistent pain patient at site pt underwent ultrasound exam which revealed an elongated approximately 3 x 8.4 x 11.5 cm anechoic fluid collection. No discrete wall. No abnormal vascularity within. No mural nodule. Findings favor probable morel Lavallee lesion. She was seen by general surgery and recommendation was made for image guided aspiration of the lesion.  She presents today for the procedure.   Patient is Full Code  Past Medical History:  Diagnosis Date   Abnormal Pap smear    follow up was wnl   Acne    Anxiety    Depression    Hypotension    taken out of work r/t bp drops   UTI (lower urinary tract infection)    Vaginal Pap smear, abnormal     Past Surgical History:  Procedure Laterality Date   WISDOM TOOTH EXTRACTION      Allergies: Cephalexin , Amoxicillin, and Prednisone  Medications: Prior to Admission medications   Medication Sig Start Date End Date Taking? Authorizing Provider  cariprazine  (VRAYLAR ) 1.5 MG capsule Take 1 capsule (1.5 mg total) by mouth daily. 03/11/24   Merlynn Niki FALCON, FNP  paragard intrauterine copper IUD IUD 1 each by Intrauterine route once.    [provider]  Vilazodone  HCl (VIIBRYD ) 40 MG TABS Take 1 tablet (40 mg total) by mouth daily. 03/11/24   Merlynn Niki FALCON, FNP     Family History  Problem Relation Age of Onset   Diabetes Mother    Hypertension Mother    Depression Father    Anxiety disorder Father    Lung cancer Paternal Uncle        lung   Lung cancer  Maternal Grandmother        lung   Diabetes Maternal Grandmother    Prostate cancer Maternal Grandfather        prostate   Prostate cancer Paternal Grandfather        Prostate   Breast cancer Cousin    Anesthesia problems Neg Hx    Hypotension Neg Hx    Pseudochol deficiency Neg Hx    Malignant hyperthermia Neg Hx     Social History   Socioeconomic History   Marital status: Divorced    Spouse name: Not on file   Number of children: 2   Years of education: Not on file   Highest education level: Associate degree: occupational, Scientist, product/process development, or vocational program  Occupational History   Occupation: CNA    Employer: [employer]  Tobacco Use   Smoking status: Every Day    Current packs/day: 0.50    Average packs/day: 0.5 packs/day for 15.6 years (7.8 ttl pk-yrs)    Types: Cigarettes    Start date: 2010   Smokeless tobacco: Never  Vaping Use   Vaping status: Some Days   Substances: Flavoring  Substance and Sexual Activity   Alcohol use: Not Currently   Drug use: No   Sexual activity: Not Currently    Partners: Male    Birth control/protection: I.U.D.  Other Topics Concern   Not on file  Social History Narrative   Not on file   Social Drivers of Health   Financial Resource Strain: Low Risk  (06/12/2024)   Overall Financial Resource Strain (CARDIA)    Difficulty of Paying Living Expenses: Not hard at all  Food Insecurity: No Food Insecurity (06/12/2024)   Hunger Vital Sign    Worried About Running Out of Food in the Last Year: Never true    Ran Out of Food in the Last Year: Never true  Transportation Needs: No Transportation Needs (06/12/2024)   PRAPARE - Administrator, Civil Service (Medical): No    Lack of Transportation (Non-Medical): No  Physical Activity: Inactive (06/12/2024)   Exercise Vital Sign    Days of Exercise per Week: 2 days    Minutes of Exercise per Session: 0 min  Stress: No Stress Concern Present (06/12/2024)   [employer] of  Occupational Health - Occupational Stress Questionnaire    Feeling of Stress: Not at all  Social Connections: Socially Isolated (06/12/2024)   Social Connection and Isolation Panel    Frequency of Communication with Friends and Family: More than three times a week    Frequency of Social Gatherings with Friends and Family: Patient declined    Attends Religious Services: Never    Database administrator or Organizations: No    Attends Engineer, structural: Not on file    Marital Status: Divorced       Review of Systems denies fever, headache, chest pain, dyspnea, cough, abdominal pain, nausea, vomiting or bleeding.  She does have some nerve pain right buttocks region  Vital Signs: Vitals:   07/01/24 1255  BP: (!) 143/85  Pulse: 78  Resp: 20  Temp: 98.1 F (36.7 C)  SpO2: 98%      Advance Care Plan: no documents on file   Physical Exam awake, alert.  Chest clear to auscultation bilaterally.  Heart with normal rate, occasional ectopy.  Abdomen soft, positive bowel sounds, nontender.  No lower extremity edema.  Imaging: US  RT LOWER EXTREM LTD SOFT TISSUE NON VASCULAR Result Date: 06/19/2024 CLINICAL DATA:  right buttock painful mass after fall. EXAM: ULTRASOUND RIGHT LOWER EXTREMITY LIMITED TECHNIQUE: Ultrasound examination of the lower extremity soft tissues was performed in the area of clinical concern. COMPARISON:  None Available. FINDINGS: Joint Space: Not applicable. Muscles: Not well evaluated on this exam. Tendons: Not well evaluated on this exam. Other Soft Tissue Structures: In the area of concern, In the right buttock subcutaneous tissue, There is an elongated approximately 3 x 8.4 x 11.5 cm anechoic fluid collection. No discrete wall. No abnormal vascularity within. No mural nodule. Findings favor probable morel Lavallee lesion. IMPRESSION: *In the area of concern, In the right buttock subcutaneous tissue, there is an elongated approximately 3 x 8.4 x 11.5 cm anechoic  fluid collection. No discrete wall. No abnormal vascularity within. No mural nodule. Findings favor probable [name] lesion. Electronically Signed   By: Ree Molt M.D.   On: 06/19/2024 10:46    Labs:  CBC: No results for input(s): WBC, HGB, HCT, PLT in the last 8760 hours.  COAGS: No results for input(s): INR, APTT in the last 8760 hours.  BMP: No results for input(s): NA, K, CL, CO2, GLUCOSE, BUN, CALCIUM, CREATININE, GFRNONAA, GFRAA in the last 8760 hours.  Invalid input(s): CMP  LIVER FUNCTION TESTS: No results for input(s): BILITOT, AST, ALT, ALKPHOS, PROT, ALBUMIN in the last 8760 hours.  TUMOR MARKERS: No results for input(s):  AFPTM, CEA, CA199, CHROMGRNA in the last 8760 hours.  Assessment and Plan: 33 y.o. female smoker with PMH sig for anxiety/depression, hypotension who slipped down the steps on June 29th of this year and developed a large tender lump on her right buttocks.  Due to persistent pain patient at site pt underwent ultrasound exam which revealed an elongated approximately 3 x 8.4 x 11.5 cm anechoic fluid collection. No discrete wall. No abnormal vascularity within. No mural nodule. Findings favor probable morel Lavallee lesion. She was seen by general surgery and recommendation was made for image guided aspiration of the lesion.  She presents today for the procedure.Risks and benefits of procedure was discussed with the patient  including, but not limited to bleeding, infection, damage to adjacent structures or low yield requiring additional tests.  All of the questions were answered and there is agreement to proceed.  Consent signed and in chart.    Thank you for allowing our service to participate in Sussan Yamilex Borgwardt 's care.  Electronically Signed: D. Franky Rakers, PA-C   06/28/2024, 3:00 PM      I spent a total of  20 minutes    in face to face in clinical consultation, greater than  50% of which was counseling/coordinating care for image guided aspiration of right buttocks lesion

## 2024-07-01 ENCOUNTER — Ambulatory Visit (HOSPITAL_COMMUNITY)
Admission: RE | Admit: 2024-07-01 | Discharge: 2024-07-01 | Disposition: A | Discharge status: Home / Self Care | Source: Ambulatory Visit | Attending: General Surgery | Admitting: General Surgery

## 2024-07-01 ENCOUNTER — Other Ambulatory Visit: Payer: Self-pay

## 2024-07-01 DIAGNOSIS — S300XXA Contusion of lower back and pelvis, initial encounter: Secondary | ICD-10-CM | POA: Insufficient documentation

## 2024-07-01 DIAGNOSIS — W109XXA Fall (on) (from) unspecified stairs and steps, initial encounter: Secondary | ICD-10-CM | POA: Insufficient documentation

## 2024-07-01 DIAGNOSIS — T148XXA Other injury of unspecified body region, initial encounter: Secondary | ICD-10-CM | POA: Diagnosis present

## 2024-07-01 HISTORY — PX: IR US GUIDE BX ASP/DRAIN: IMG2392

## 2024-07-01 LAB — CBC WITH DIFFERENTIAL/PLATELET
Abs Immature Granulocytes: 0.01 K/uL (ref 0.00–0.07)
Basophils Absolute: 0 K/uL (ref 0.0–0.1)
Basophils Relative: 1 %
Eosinophils Absolute: 0.1 K/uL (ref 0.0–0.5)
Eosinophils Relative: 3 %
HCT: 36.8 % (ref 36.0–46.0)
Hemoglobin: 11.9 g/dL — ABNORMAL LOW (ref 12.0–15.0)
Immature Granulocytes: 0 %
Lymphocytes Relative: 39 %
Lymphs Abs: 1.8 K/uL (ref 0.7–4.0)
MCH: 28.8 pg (ref 26.0–34.0)
MCHC: 32.3 g/dL (ref 30.0–36.0)
MCV: 89.1 fL (ref 80.0–100.0)
Monocytes Absolute: 0.4 K/uL (ref 0.1–1.0)
Monocytes Relative: 8 %
Neutro Abs: 2.3 K/uL (ref 1.7–7.7)
Neutrophils Relative %: 49 %
Platelets: 282 K/uL (ref 150–400)
RBC: 4.13 MIL/uL (ref 3.87–5.11)
RDW: 12.8 % (ref 11.5–15.5)
Smear Review: NORMAL
WBC: 4.7 K/uL (ref 4.0–10.5)
nRBC: 0 % (ref 0.0–0.2)

## 2024-07-01 LAB — BASIC METABOLIC PANEL WITH GFR
Anion gap: 10 (ref 5–15)
BUN: 9 mg/dL (ref 6–20)
CO2: 23 mmol/L (ref 22–32)
Calcium: 8.9 mg/dL (ref 8.9–10.3)
Chloride: 105 mmol/L (ref 98–111)
Creatinine, Ser: 0.69 mg/dL (ref 0.44–1.00)
GFR, Estimated: >60 mL/min (ref >60)
Glucose, Bld: 94 mg/dL (ref 70–99)
Potassium: 3.7 mmol/L (ref 3.5–5.1)
Sodium: 138 mmol/L (ref 135–145)

## 2024-07-01 LAB — PROTIME-INR
INR: 1.1 (ref 0.8–1.2)
Prothrombin Time: 14.5 s (ref 11.4–15.2)

## 2024-07-01 MED ORDER — MIDAZOLAM HCL 2 MG/2ML IJ SOLN
INTRAMUSCULAR | Status: AC
Start: 2024-07-01 — End: 2024-07-01
  Filled 2024-07-01: qty 2

## 2024-07-01 MED ORDER — MIDAZOLAM HCL 2 MG/2ML IJ SOLN
INTRAMUSCULAR | Status: AC | PRN
Start: 2024-07-01 — End: 2024-07-01
  Administered 2024-07-01: 1 mg via INTRAVENOUS

## 2024-07-01 MED ORDER — SODIUM CHLORIDE 0.9 % IV SOLN: INTRAVENOUS | Status: DC

## 2024-07-01 MED ORDER — MIDAZOLAM HCL 2 MG/2ML IJ SOLN
INTRAMUSCULAR | Status: AC | PRN
  Administered 2024-07-01: 1 mg via INTRAVENOUS

## 2024-07-01 MED ORDER — FENTANYL CITRATE (PF) 100 MCG/2ML IJ SOLN
INTRAMUSCULAR | Status: AC | PRN
  Administered 2024-07-01: 50 ug via INTRAVENOUS

## 2024-07-01 MED ORDER — LIDOCAINE HCL 1 % IJ SOLN
20.0000 mL | Freq: Once | INTRAMUSCULAR | Status: AC
  Administered 2024-07-01: 10 mL via INTRADERMAL

## 2024-07-01 MED ORDER — FENTANYL CITRATE (PF) 100 MCG/2ML IJ SOLN
INTRAMUSCULAR | Status: AC
Start: 2024-07-01 — End: 2024-07-01
  Filled 2024-07-01: qty 2

## 2024-07-01 MED ORDER — LIDOCAINE HCL 1 % IJ SOLN
INTRAMUSCULAR | Status: AC
  Filled 2024-07-01: qty 20

## 2024-07-01 NOTE — Sedation Documentation (Signed)
 RN Christoffer Currier pulled 2 mg Versed  and 100 mcg Fentanyl  in IR room. Pt. Received 2 mg Versed  and 100 mcg Fentanyl  throughout the procedure.

## 2024-07-01 NOTE — Discharge Instructions (Signed)
Please call Interventional Radiology clinic [phone] with any questions or concerns.  You may remove your dressing and shower tomorrow.  After the procedure, it is common to have: Soreness Bruising Mild pain  Follow these instructions at home:  Medication: Do not use Aspirin or ibuprofen products, such as Advil or Motrin, as it may increase bleeding  You may resume your usual medications as ordered by your doctor If your doctor prescribed antibiotics, take them as directed. Do not stop taking them just because you feel better. You need to take the full course of antibiotics  Eating and drinking: Drink plenty of liquids to keep your urine pale yellow You can resume your regular diet as directed by your doctor   Care of the procedure site Wash your hands with soap and water before you change your bandage (dressing). If you cannot use soap and water, use hand sanitizer. Check your puncture site every day for signs of infection. Check for: Redness, swelling, or pain Fluid or blood Pus or a bad smell Warmth Activity Do not take baths, swim, or use a hot tub until your health care provider approves  Keep all follow-up visits as told by your doctor  Contact a doctor if you have: A fever Redness, swelling, or pain at the puncture site, and it lasts longer than a few days Fluid, blood, or pus coming from the puncture site Warmth coming from the puncture site  Get help right away if: You have a lot of bleeding from the puncture site   Moderate Conscious Sedation-Care After   This sheet gives you information about how to care for yourself after your procedure. Your health care provider may also give you more specific instructions. If you have problems or questions, contact your health care provider.  After the procedure, it is common to have: Sleepiness for several hours. Impaired judgment for several hours. Difficulty with balance. Vomiting if you eat too soon.  Follow  these instructions at home:  Rest. Do not participate in activities where you could fall or become injured. Do not drive or use machinery. Do not drink alcohol. Do not take sleeping pills or medicines that cause drowsiness. Do not make important decisions or sign legal documents. Do not take care of children on your own.  Eating and drinking Follow the diet recommended by your health care provider. Drink enough fluid to keep your urine pale yellow. If you vomit: Drink water, juice, or soup when you can drink without vomiting. Make sure you have little or no nausea before eating solid foods.  General instructions Take over-the-counter and prescription medicines only as told by your health care provider. Have a responsible adult stay with you for the time you are told. It is important to have someone help care for you until you are awake and alert. Do not smoke. Keep all follow-up visits as told by your health care provider. This is important.  Contact a health care provider if: You are still sleepy or having trouble with balance after 24 hours. You feel light-headed. You keep feeling nauseous or you keep vomiting. You develop a rash. You have a fever. You have redness or swelling around the IV site.  Get help right away if: You have trouble breathing. You have new-onset confusion at home.  This information is not intended to replace advice given to you by your health care provider. Make sure you discuss any questions you have with your healthcare provider.

## 2024-07-01 NOTE — Procedures (Signed)
 Interventional Radiology Procedure Note  Procedure: Ultrasound guided aspiration of right buttock fluid collection  Complications: None  Estimated Blood Loss: None  Findings: Right buttock subcutaneous fluid collection aspirated with 5 Fr Yueh catheter yielding 100 mL of orange colored fluid. Sample sent for culture. US  shows collapse of collection after aspiration.  [facility]. Luverne, M.D Pager:  [phone]

## 2024-07-06 LAB — AEROBIC/ANAEROBIC CULTURE W GRAM STAIN (SURGICAL/DEEP WOUND)
Culture: NO GROWTH
Gram Stain: NONE SEEN
Special Requests: NORMAL

## 2024-07-09 ENCOUNTER — Other Ambulatory Visit (HOSPITAL_BASED_OUTPATIENT_CLINIC_OR_DEPARTMENT_OTHER): Payer: Self-pay

## 2024-07-09 ENCOUNTER — Ambulatory Visit: Payer: Self-pay

## 2024-07-09 NOTE — Telephone Encounter (Signed)
 FYI Only or Action Required?: Action required by provider: medication refill request.  Patient was last seen in primary care on 03/11/2024 by Victoria Niki FALCON, FNP.  Called Nurse Triage reporting Medication Problem.  Symptoms began several months ago.  Interventions attempted: Nothing.  Symptoms are: gradually worsening.  Triage Disposition: Call PCP When Office is Open  Patient/caregiver understands and will follow disposition?: Yes   Copied from CRM #[id number]. Topic: Clinical - Medication Question >> Jul 09, 2024 12:49 PM Tysheama G wrote: Reason for CRM: Patient is calling because she would like to be placed on Lisdexamfetamine (Vyvanse ) 70MG  again and she stated when she ask on MyChart she gets no response.Can someone please contact her [phone] Reason for Disposition  Caller requesting a CONTROLLED substance prescription refill (e.g., narcotics, ADHD medicines)  Answer Assessment - Initial Assessment Questions Patient states she was recently on medication in January and requested to be off medication due to her insurance not covering the medication. Patient states she has had difficulty focusing that started 1 week after stopping the medication. Pt states she would like the medication to be [facility] [place] [facility]. Pt requesting a call back if medication is approved.    1. DRUG NAME: What medicine do you need to have refilled?     Lisdexamfetamine (Vyvanse ) 70MG      2. PRESCRIBER: Who prescribed it? Note: The prescribing doctor or group is responsible for refill approvals.SABRA Joshua Debby LITTIE MD  3. PHARMACY: Have you contacted your pharmacy (drugstore)? Note: Some pharmacies will contact the doctor (or NP/PA).      Yes  4. SYMPTOMS: Do you have any symptoms?     Difficulty focusing  Protocols used: Medication Refill and Renewal Call-A-AH

## 2024-07-11 ENCOUNTER — Encounter: Payer: Self-pay | Admitting: Family Medicine

## 2024-07-11 NOTE — Telephone Encounter (Signed)
 Patient has been scheduled for an appointment with Corean and will follow up with Dr. Joshua after seeing stephanie

## 2024-07-12 ENCOUNTER — Ambulatory Visit: Admitting: Family Medicine

## 2024-07-12 NOTE — Telephone Encounter (Signed)
 This has been handled in a telephone encounter please see encounter for information.

## 2024-07-16 ENCOUNTER — Telehealth (INDEPENDENT_AMBULATORY_CARE_PROVIDER_SITE_OTHER): Admitting: Family Medicine

## 2024-07-16 ENCOUNTER — Other Ambulatory Visit (HOSPITAL_BASED_OUTPATIENT_CLINIC_OR_DEPARTMENT_OTHER): Payer: Self-pay

## 2024-07-16 ENCOUNTER — Encounter: Payer: Self-pay | Admitting: Family Medicine

## 2024-07-16 VITALS — BP 138/80

## 2024-07-16 DIAGNOSIS — F5081 Binge eating disorder, mild: Secondary | ICD-10-CM

## 2024-07-16 DIAGNOSIS — Z79899 Other long term (current) drug therapy: Secondary | ICD-10-CM | POA: Diagnosis not present

## 2024-07-16 DIAGNOSIS — F909 Attention-deficit hyperactivity disorder, unspecified type: Secondary | ICD-10-CM

## 2024-07-16 MED ORDER — LISDEXAMFETAMINE DIMESYLATE 40 MG PO CAPS
40.0000 mg | ORAL_CAPSULE | ORAL | 0 refills | Status: DC
  Filled 2024-07-16: qty 30, 30d supply, fill #0

## 2024-07-16 NOTE — Progress Notes (Signed)
   Video Visit Note  Subjective:     Patient ID: [facility], female    DOB: [birth year], 33 y.o.   MRN: [id number]  Chief Complaint  Patient presents with   Follow-up    Has been off of Vyvanse  since January, is having difficulty paying attention, remembering things, struggles staying on task     HPI  I connected with Chuckie Nat Pringle on 07/16/24 at  4:00 PM EDT by a video enabled telemedicine application and verified that I am speaking with the correct person using two identifiers.  Patient Location: Home Provider Location: Office/Clinic  I discussed the limitations, risks, security, and privacy concerns of performing an evaluation and management service by video and the availability of in person appointments. I also discussed with the patient that there may be a patient responsible charge related to this service. The patient expressed understanding and agreed to proceed.  33 yo female presents for A/V visit to discuss restarting Vyvanse  for ADHD. She was previously on Vyvanse  70 mg once daily, had to stop this in January due to it not being covered by insurance. States she has spoken to insurance and they will cover 30-day supplies, but not 90-day supplies. Denies previous negative side effects including hypertension, tachycardia, constipation. Reports that she is having difficulty focusing, requesting to restart Vyvanse . Denies other concerns today.     ROS Per HPI      Objective:    BP 138/80 Comment: home  LMP 06/24/2024 (Exact Date)    Physical Exam Vitals and nursing note reviewed.  Constitutional:      General: She is not in acute distress.    Appearance: Normal appearance.  HENT:     Head: Normocephalic and atraumatic.  Eyes:     Extraocular Movements: Extraocular movements intact.  Pulmonary:     Effort: Pulmonary effort is normal.  Musculoskeletal:     Cervical back: Normal range of motion.  Neurological:     General: No focal deficit  present.     Mental Status: She is alert and oriented to person, place, and time.  Psychiatric:        Mood and Affect: Mood normal.        Behavior: Behavior normal.     No results found for any visits on 07/16/24.      Assessment & Plan:   Attention deficit hyperactivity disorder (ADHD), unspecified ADHD type -     Lisdexamfetamine Dimesylate ; Take 1 capsule (40 mg total) by mouth every morning.  Dispense: 30 capsule; Refill: 0  Mild binge-eating disorder -     Lisdexamfetamine Dimesylate ; Take 1 capsule (40 mg total) by mouth every morning.  Dispense: 30 capsule; Refill: 0  Medication management -     Lisdexamfetamine Dimesylate ; Take 1 capsule (40 mg total) by mouth every morning.  Dispense: 30 capsule; Refill: 0       No orders of the defined types were placed in this encounter.    Meds ordered this encounter  Medications   lisdexamfetamine (VYVANSE ) 40 MG capsule    Sig: Take 1 capsule (40 mg total) by mouth every morning.    Dispense:  30 capsule    Refill:  0    The patient was advised to call back or seek an in-person evaluation if the symptoms worsen or if the condition fails to improve as anticipated.  Return in about 4 weeks (around 08/13/2024) for meds.  Corean LITTIE Ku, FNP

## 2024-07-16 NOTE — Patient Instructions (Signed)
 I have sent in Vyvanse  40mg  for you to take once daily in the morning.  Check BP at home once daily in the mornings for the next month.   Follow up with me in a month to recheck blood pressures and medication effectiveness

## 2024-08-15 ENCOUNTER — Ambulatory Visit: Admitting: Internal Medicine

## 2024-08-15 ENCOUNTER — Other Ambulatory Visit: Payer: Self-pay

## 2024-08-15 ENCOUNTER — Encounter: Payer: Self-pay | Admitting: Internal Medicine

## 2024-08-15 ENCOUNTER — Telehealth: Payer: Self-pay

## 2024-08-15 DIAGNOSIS — Z79899 Other long term (current) drug therapy: Secondary | ICD-10-CM

## 2024-08-15 DIAGNOSIS — F909 Attention-deficit hyperactivity disorder, unspecified type: Secondary | ICD-10-CM

## 2024-08-15 DIAGNOSIS — F5081 Binge eating disorder, mild: Secondary | ICD-10-CM

## 2024-08-15 NOTE — Telephone Encounter (Signed)
 Copied from CRM [id number]. Topic: Appointments - Appointment Info/Confirmation >> Aug 15, 2024  2:34 PM [facility] wrote: Patient/patient representative is calling for information regarding an appointment. >> Aug 15, 2024  2:46 PM [name] wrote: Patient said that she is not able to get off from work and wants to know if Corean Cough can write her prescriptions for her

## 2024-08-16 ENCOUNTER — Other Ambulatory Visit: Payer: Self-pay

## 2024-08-16 ENCOUNTER — Other Ambulatory Visit: Payer: Self-pay | Admitting: Family Medicine

## 2024-08-16 ENCOUNTER — Other Ambulatory Visit (HOSPITAL_BASED_OUTPATIENT_CLINIC_OR_DEPARTMENT_OTHER): Payer: Self-pay

## 2024-08-16 DIAGNOSIS — F909 Attention-deficit hyperactivity disorder, unspecified type: Secondary | ICD-10-CM

## 2024-08-16 DIAGNOSIS — F5081 Binge eating disorder, mild: Secondary | ICD-10-CM

## 2024-08-16 DIAGNOSIS — Z79899 Other long term (current) drug therapy: Secondary | ICD-10-CM

## 2024-08-16 MED ORDER — LISDEXAMFETAMINE DIMESYLATE 40 MG PO CAPS
40.0000 mg | ORAL_CAPSULE | ORAL | 0 refills | Status: DC
Start: 2024-08-16 — End: 2024-09-11
  Filled 2024-08-16: qty 30, 30d supply, fill #0

## 2024-08-16 NOTE — Telephone Encounter (Signed)
**Note De-identified  [place] Obfuscation** Please advise 

## 2024-08-22 ENCOUNTER — Ambulatory Visit: Admitting: Internal Medicine

## 2024-09-03 ENCOUNTER — Other Ambulatory Visit: Payer: Self-pay | Admitting: Family Medicine

## 2024-09-03 DIAGNOSIS — F331 Major depressive disorder, recurrent, moderate: Secondary | ICD-10-CM

## 2024-09-04 ENCOUNTER — Telehealth: Payer: Self-pay

## 2024-09-04 NOTE — Telephone Encounter (Signed)
 Copied from CRM [id number]. Topic: Clinical - Medication Question >> Sep 04, 2024 12:15 PM [facility] wrote: Reason for CRM: Patient is waiting for a response on medication refill.    Cariprazine  HCl 1.5 mg Oral Daily Vilazodone  HCl 40 mg Oral Daily

## 2024-09-05 ENCOUNTER — Encounter: Payer: Self-pay | Admitting: Family Medicine

## 2024-09-05 ENCOUNTER — Other Ambulatory Visit (HOSPITAL_COMMUNITY): Payer: Self-pay

## 2024-09-05 MED ORDER — VILAZODONE HCL 40 MG PO TABS
40.0000 mg | ORAL_TABLET | Freq: Every day | ORAL | 0 refills | Status: DC
  Filled 2024-09-05: qty 90, 90d supply, fill #0

## 2024-09-05 MED ORDER — CARIPRAZINE HCL 1.5 MG PO CAPS
1.5000 mg | ORAL_CAPSULE | Freq: Every day | ORAL | 0 refills | Status: DC
  Filled 2024-09-05: qty 90, 90d supply, fill #0

## 2024-09-05 NOTE — Telephone Encounter (Signed)
Medications has been refilled

## 2024-09-06 ENCOUNTER — Other Ambulatory Visit (HOSPITAL_COMMUNITY): Payer: Self-pay

## 2024-09-11 ENCOUNTER — Encounter: Payer: Self-pay | Admitting: Internal Medicine

## 2024-09-11 ENCOUNTER — Other Ambulatory Visit: Payer: Self-pay

## 2024-09-11 ENCOUNTER — Other Ambulatory Visit (HOSPITAL_BASED_OUTPATIENT_CLINIC_OR_DEPARTMENT_OTHER): Payer: Self-pay

## 2024-09-11 ENCOUNTER — Ambulatory Visit: Admitting: Internal Medicine

## 2024-09-11 VITALS — BP 132/82 | HR 96 | Temp 99.2°F | Resp 16 | Ht 67.0 in | Wt 238.2 lb

## 2024-09-11 DIAGNOSIS — D539 Nutritional anemia, unspecified: Secondary | ICD-10-CM | POA: Insufficient documentation

## 2024-09-11 DIAGNOSIS — I10 Essential (primary) hypertension: Secondary | ICD-10-CM | POA: Diagnosis not present

## 2024-09-11 DIAGNOSIS — F909 Attention-deficit hyperactivity disorder, unspecified type: Secondary | ICD-10-CM

## 2024-09-11 DIAGNOSIS — T792XXA Traumatic secondary and recurrent hemorrhage and seroma, initial encounter: Secondary | ICD-10-CM | POA: Diagnosis not present

## 2024-09-11 DIAGNOSIS — F5081 Binge eating disorder, mild: Secondary | ICD-10-CM

## 2024-09-11 DIAGNOSIS — E611 Iron deficiency: Secondary | ICD-10-CM | POA: Diagnosis not present

## 2024-09-11 LAB — CBC WITH DIFFERENTIAL/PLATELET
Basophils Absolute: 0.1 K/uL (ref 0.0–0.1)
Basophils Relative: 1.3 % (ref 0.0–3.0)
Eosinophils Absolute: 0.1 K/uL (ref 0.0–0.7)
Eosinophils Relative: 2.1 % (ref 0.0–5.0)
HCT: 36.4 % (ref 36.0–46.0)
Hemoglobin: 12.5 g/dL (ref 12.0–15.0)
Lymphocytes Relative: 39.8 % (ref 12.0–46.0)
Lymphs Abs: 2 K/uL (ref 0.7–4.0)
MCHC: 34.3 g/dL (ref 30.0–36.0)
MCV: 88 fl (ref 78.0–100.0)
Monocytes Absolute: 0.3 K/uL (ref 0.1–1.0)
Monocytes Relative: 5.1 % (ref 3.0–12.0)
Neutro Abs: 2.7 K/uL (ref 1.4–7.7)
Neutrophils Relative %: 51.7 % (ref 43.0–77.0)
Platelets: 288 K/uL (ref 150.0–400.0)
RBC: 4.14 Mil/uL (ref 3.87–5.11)
RDW: 13.2 % (ref 11.5–15.5)
WBC: 5.1 K/uL (ref 4.0–10.5)

## 2024-09-11 LAB — HEPATIC FUNCTION PANEL
ALT: 19 U/L (ref 0–35)
AST: 20 U/L (ref 0–37)
Albumin: 4.5 g/dL (ref 3.5–5.2)
Alkaline Phosphatase: 59 U/L (ref 39–117)
Bilirubin, Direct: 0.1 mg/dL (ref 0.0–0.3)
Total Bilirubin: 0.3 mg/dL (ref 0.2–1.2)
Total Protein: 7.5 g/dL (ref 6.0–8.3)

## 2024-09-11 LAB — IBC + FERRITIN
Ferritin: 19.9 ng/mL (ref 10.0–291.0)
Iron: 35 ug/dL — ABNORMAL LOW (ref 42–145)
Saturation Ratios: 9 % — ABNORMAL LOW (ref 20.0–50.0)
TIBC: 390.6 ug/dL (ref 250.0–450.0)
Transferrin: 279 mg/dL (ref 212.0–360.0)

## 2024-09-11 LAB — C-REACTIVE PROTEIN: CRP: <0.5 mg/dL (ref 0.5–20.0)

## 2024-09-11 LAB — TSH: TSH: 2.45 u[IU]/mL (ref 0.35–5.50)

## 2024-09-11 MED ORDER — LISDEXAMFETAMINE DIMESYLATE 50 MG PO CAPS
50.0000 mg | ORAL_CAPSULE | Freq: Every day | ORAL | 0 refills | Status: DC
  Filled 2024-09-11 – 2024-09-16 (×2): qty 30, 30d supply, fill #0

## 2024-09-11 NOTE — Progress Notes (Unsigned)
 Subjective:  Patient ID: Victoria Wright, female    DOB: 1991/03/16  Age: 33 y.o. MRN: [id number]  CC: Medication Follow Up (Patient seen corean ku , FNP back in September. ), FMLA (Paperwork. ), Anemia, and Hypertension   HPI Victoria Wright presents for f/up ----  Discussed the use of AI scribe software for clinical note transcription with the patient, who gave verbal consent to proceed.  History of Present Illness Victoria Wright is a 33 year old female who presents with a recurrent seroma on her right buttock and concerns about Vyvanse  efficacy.  She has a lump on her right buttock that developed following a fall on May 05, 2024. An initial ultrasound revealed fluid accumulation, and 20 ccs of fluid were drained by interventional radiology. The fluid was described as 'orange type fluid' and tested without concerning results. Despite the drainage, the lump has returned and is painful, but she reports no fever or chills.  She has been taking Vyvanse  30 mg for approximately two months to aid in focus and productivity at work. The medication is not lasting throughout the day, with effects wearing off around noon, leading to feelings of sluggishness. She experiences occasional palpitations, which began when she first started the medication a year ago but resolved when she temporarily stopped due to insurance issues. No other side effects such as headache, blurred vision, dizziness, or lightheadedness are reported.  She monitors her blood pressure at home and reports normal readings, although a high reading was noted during the visit. She is currently using ParaGard copper for contraception and her last menstrual period was on August 29, 2024.     Outpatient Medications Prior to Visit  Medication Sig Dispense Refill   cariprazine  (VRAYLAR ) 1.5 MG capsule Take 1 capsule (1.5 mg total) by mouth daily. 90 capsule 0   paragard intrauterine copper IUD IUD 1 each by  Intrauterine route once.     Vilazodone  HCl (VIIBRYD ) 40 MG TABS Take 1 tablet (40 mg total) by mouth daily. 90 tablet 0   lisdexamfetamine (VYVANSE ) 40 MG capsule Take 1 capsule (40 mg total) by mouth every morning. 30 capsule 0   No facility-administered medications prior to visit.    ROS Review of Systems  Objective:  BP 132/82 (BP Location: Left Arm, Patient Position: Sitting, Cuff Size: Normal)   Pulse 96   Temp 99.2 F (37.3 C) (Oral)   Resp 16   Ht 5' 7 (1.702 m)   Wt 238 lb 3.2 oz (108 kg)   LMP 08/29/2024   SpO2 99%   BMI 37.31 kg/m   BP Readings from Last 3 Encounters:  09/11/24 132/82  07/16/24 138/80  07/01/24 129/70    Wt Readings from Last 3 Encounters:  09/11/24 238 lb 3.2 oz (108 kg)  07/01/24 230 lb (104.3 kg)  06/14/24 230 lb (104.3 kg)    Physical Exam  Lab Results  Component Value Date   WBC 5.1 09/11/2024   HGB 12.5 09/11/2024   HCT 36.4 09/11/2024   PLT 288.0 09/11/2024   GLUCOSE 94 07/01/2024   CHOL 160 06/28/2021   TRIG 177.0 (H) 06/28/2021   HDL 40.40 06/28/2021   LDLCALC 84 06/28/2021   ALT 19 09/11/2024   AST 20 09/11/2024   NA 138 07/01/2024   K 3.7 07/01/2024   CL 105 07/01/2024   CREATININE 0.69 07/01/2024   BUN 9 07/01/2024   CO2 23 07/01/2024   TSH 2.45 09/11/2024   INR 1.1 07/01/2024  HGBA1C 5.8 06/28/2021    IR US  Guide Bx Asp/Drain Result Date: 07/01/2024 INDICATION: Fall down steps approximately 2 months ago with right buttock injury and some persistent discomfort and palpable lump. Ultrasound recently demonstrated an elongated subcutaneous fluid collection EXAM: ULTRASOUND-GUIDED ASPIRATION RIGHT BUTTOCK SUBCUTANEOUS FLUID COLLECTION MEDICATIONS: None ANESTHESIA/SEDATION: Moderate (conscious) sedation was employed during this procedure. A total of Versed  2.0 mg and Fentanyl  100 mcg was administered intravenously by the radiology nurse. Total intra-service moderate Sedation Time: 12 minutes. The patient's level of  consciousness and vital signs were monitored continuously by radiology nursing throughout the procedure under my direct supervision. COMPLICATIONS: None immediate. PROCEDURE: Informed written consent was obtained from the patient after a thorough discussion of the procedural risks, benefits and alternatives. All questions were addressed. Maximal Sterile Barrier Technique was utilized including caps, mask, sterile gowns, sterile gloves, sterile drape, hand hygiene and skin antiseptic. Local anesthesia was provided with 1% lidocaine . A timeout was performed prior to the initiation of the procedure. After localizing a right buttock subcutaneous fluid collection, a 5 French Yueh centesis catheter was inserted into the fluid collection under ultrasound guidance. Aspiration drainage was then performed through the 5 French catheter into an evacuation bag. A 20 mL fluid sample was sent for culture analysis. Additional ultrasound was performed after aspiration drainage. FINDINGS: Similar to the recent diagnostic ultrasound, an elongated mildly complex fluid collection was identified within the subcutaneous fat of the right buttock region. Aspiration yielded orange colored fluid with a total volume of approximately 100 mL of fluid aspirated. This resulted in complete decompression of the fluid collection by ultrasound. IMPRESSION: Ultrasound-guided aspiration drainage of elongated subcutaneous fluid collection in the right buttock region yielding 100 mL of orange colored fluid and resulting in complete decompression. A fluid sample was sent for culture analysis. Electronically Signed   By: Marcey Moan M.D.   On: 07/01/2024 16:52    Assessment & Plan:  Seroma, post-traumatic -     Ambulatory referral to General Surgery -     CBC with Differential/Platelet; Future -     C-reactive protein; Future  Mild binge-eating disorder -     Lisdexamfetamine Dimesylate ; Take 1 capsule (50 mg total) by mouth daily.  Dispense:  30 capsule; Refill: 0  Attention deficit hyperactivity disorder (ADHD), unspecified ADHD type -     Lisdexamfetamine Dimesylate ; Take 1 capsule (50 mg total) by mouth daily.  Dispense: 30 capsule; Refill: 0  Deficiency anemia -     IBC + Ferritin; Future -     CBC with Differential/Platelet; Future  Primary hypertension -     TSH; Future -     Hepatic function panel; Future     Follow-up: Return in about 3 months (around 12/12/2024).  Debby Molt, MD

## 2024-09-11 NOTE — Patient Instructions (Signed)

## 2024-09-12 ENCOUNTER — Ambulatory Visit: Payer: Self-pay | Admitting: Internal Medicine

## 2024-09-12 DIAGNOSIS — I10 Essential (primary) hypertension: Secondary | ICD-10-CM | POA: Insufficient documentation

## 2024-09-12 DIAGNOSIS — E611 Iron deficiency: Secondary | ICD-10-CM | POA: Insufficient documentation

## 2024-09-12 MED ORDER — ACCRUFER 30 MG PO CAPS: 1.0000 | ORAL_CAPSULE | Freq: Two times a day (BID) | ORAL | 0 refills | Status: DC

## 2024-09-16 ENCOUNTER — Other Ambulatory Visit (HOSPITAL_BASED_OUTPATIENT_CLINIC_OR_DEPARTMENT_OTHER): Payer: Self-pay

## 2024-09-24 ENCOUNTER — Telehealth: Payer: Self-pay

## 2024-09-24 NOTE — Telephone Encounter (Signed)
 Spoke with patient about FMLA paperwork questions, as she was needing it more for her appointments than for actual work accommodations.

## 2024-09-24 NOTE — Telephone Encounter (Signed)
 Spoke with patient about her FMLA paperwork, as it is due today. Patient was not aware that we needed additional details about her care outside of her PCP, as we are following but she is also getting treatment at other specialty care.   We talked through her needs as she has many appointments that have happened and additional ones to come.  We discussed the details if she has to have a JP drain placed that it will likely need additional FMLA for that continues time, but this will be intermittent for her appointments and care so far and to come.  Patient documents completed, sent to [employer], and copy sent to patient via MyChart.

## 2024-09-25 DIAGNOSIS — T148XXA Other injury of unspecified body region, initial encounter: Secondary | ICD-10-CM | POA: Diagnosis not present

## 2024-09-30 ENCOUNTER — Other Ambulatory Visit: Payer: Self-pay

## 2024-09-30 ENCOUNTER — Encounter: Payer: Self-pay | Admitting: Internal Medicine

## 2024-09-30 ENCOUNTER — Ambulatory Visit: Admitting: Internal Medicine

## 2024-09-30 ENCOUNTER — Other Ambulatory Visit (HOSPITAL_COMMUNITY): Payer: Self-pay | Admitting: General Surgery

## 2024-09-30 ENCOUNTER — Other Ambulatory Visit (HOSPITAL_COMMUNITY): Payer: Self-pay | Admitting: Radiology

## 2024-09-30 VITALS — BP 132/84 | HR 88 | Temp 98.7°F | Resp 16 | Ht 67.0 in | Wt 237.8 lb

## 2024-09-30 DIAGNOSIS — Z0000 Encounter for general adult medical examination without abnormal findings: Secondary | ICD-10-CM | POA: Diagnosis not present

## 2024-09-30 DIAGNOSIS — Z6837 Body mass index (BMI) 37.0-37.9, adult: Secondary | ICD-10-CM | POA: Diagnosis not present

## 2024-09-30 DIAGNOSIS — Z0001 Encounter for general adult medical examination with abnormal findings: Secondary | ICD-10-CM

## 2024-09-30 DIAGNOSIS — I10 Essential (primary) hypertension: Secondary | ICD-10-CM

## 2024-09-30 DIAGNOSIS — E66812 Obesity, class 2: Secondary | ICD-10-CM | POA: Diagnosis not present

## 2024-09-30 DIAGNOSIS — T148XXA Other injury of unspecified body region, initial encounter: Secondary | ICD-10-CM

## 2024-09-30 MED ORDER — CONTRAVE 8-90 MG PO TB12
ORAL_TABLET | ORAL | 3 refills | Status: AC
Start: 2024-09-30 — End: ?

## 2024-09-30 NOTE — Progress Notes (Signed)
 Received request for drain for recurrent seroma. See review below.  Victoria Sieving, MD  Victoria Wright  US  drain of R gluteal collection (recurrent) No sedation Possible Morel-Lavalle seroma  [facility]

## 2024-09-30 NOTE — Patient Instructions (Signed)

## 2024-09-30 NOTE — Progress Notes (Signed)
 "  Subjective:  Patient ID: Victoria Wright, female    DOB: 10-12-1991  Age: 33 y.o. MRN: [id number]  CC: Annual Exam (No lab work ) and Hypertension   HPI Victoria Wright presents for a CPX and f/up ---  Discussed the use of AI scribe software for clinical note transcription with the patient, who gave verbal consent to proceed.  History of Present Illness Victoria Wright is a 33 year old female who presents with concerns about her menstrual cycle and blood pressure monitoring.  She experiences fatigue associated with her menstrual cycle, which began yesterday. She describes this as 'just that time of the month' and denies any abnormal symptoms such as long or heavy periods.  She has been monitoring her blood pressure at home and notes that it does not go below 135/82 mmHg. No symptoms such as headaches, blurred vision, or other high blood pressure symptoms are present.  Her current medications include Vraylar , Vyvanse , and vilazodone . She reports no side effects from these medications when taken on time. However, she experiences vivid dreams and auditory hallucinations if she misses a dose of Vyvanse  by about three hours, which her son has also noticed.  She has received a flu shot and her last tetanus shot was seven years ago. She does not receive COVID-19 vaccinations. She has an IUD in place and is not at risk of pregnancy.  No constipation, diarrhea, pain, or swelling. She denies experiencing jitteriness, insomnia, or anxiety.     Outpatient Medications Prior to Visit  Medication Sig Dispense Refill   cariprazine  (VRAYLAR ) 1.5 MG capsule Take 1 capsule (1.5 mg total) by mouth daily. 90 capsule 0   lisdexamfetamine  (VYVANSE ) 50 MG capsule Take 1 capsule (50 mg total) by mouth daily. 30 capsule 0   paragard intrauterine copper IUD IUD 1 each by Intrauterine route once.     Vilazodone  HCl (VIIBRYD ) 40 MG TABS Take 1 tablet (40 mg total) by mouth daily. 90 tablet 0    Ferric Maltol  (ACCRUFER ) 30 MG CAPS Take 1 capsule (30 mg total) by mouth in the morning and at bedtime. 180 capsule 0   No facility-administered medications prior to visit.    ROS Review of Systems  Constitutional:  Positive for fatigue and unexpected weight change (wt gain). Negative for appetite change, chills, diaphoresis and fever.  HENT: Negative.  Negative for trouble swallowing.   Eyes: Negative.   Respiratory: Negative.  Negative for cough, chest tightness, shortness of breath and wheezing.   Cardiovascular:  Negative for chest pain, palpitations and leg swelling.  Gastrointestinal: Negative.  Negative for abdominal pain, constipation, diarrhea, nausea and vomiting.  Endocrine: Negative.   Genitourinary: Negative.  Negative for difficulty urinating and dysuria.  Musculoskeletal: Negative.  Negative for arthralgias and myalgias.  Neurological: Negative.  Negative for dizziness, weakness and light-headedness.  Hematological:  Negative for adenopathy. Does not bruise/bleed easily.  Psychiatric/Behavioral:  Positive for dysphoric mood. Negative for behavioral problems, confusion, decreased concentration, sleep disturbance and suicidal ideas. The patient is not nervous/anxious.     Objective:  BP 132/84 (BP Location: Left Arm, Patient Position: Sitting, Cuff Size: Normal)   Pulse 88   Temp 98.7 F (37.1 C) (Oral)   Resp 16   Ht 5' 7 (1.702 m)   Wt 237 lb 12.8 oz (107.9 kg)   LMP 09/29/2024 (Exact Date)   SpO2 97%   BMI 37.24 kg/m   BP Readings from Last 3 Encounters:  09/30/24 132/84  09/11/24 132/82  07/16/24 138/80    Wt Readings from Last 3 Encounters:  09/30/24 237 lb 12.8 oz (107.9 kg)  09/11/24 238 lb 3.2 oz (108 kg)  07/01/24 230 lb (104.3 kg)    Physical Exam Vitals reviewed.  Constitutional:      Appearance: Normal appearance.  HENT:     Nose: Nose normal.     Mouth/Throat:     Mouth: Mucous membranes are moist.  Eyes:     General: No scleral  icterus.    Conjunctiva/sclera: Conjunctivae normal.  Cardiovascular:     Rate and Rhythm: Normal rate and regular rhythm.     Heart sounds: No murmur heard.    No friction rub. No gallop.  Pulmonary:     Effort: Pulmonary effort is normal.     Breath sounds: No stridor. No wheezing, rhonchi or rales.  Abdominal:     General: Abdomen is flat.     Palpations: There is no mass.     Tenderness: There is no abdominal tenderness. There is no guarding.     Hernia: No hernia is present.  Musculoskeletal:        General: Normal range of motion.     Cervical back: Neck supple.     Right lower leg: No edema.     Left lower leg: No edema.  Lymphadenopathy:     Cervical: No cervical adenopathy.  Skin:    General: Skin is warm and dry.  Neurological:     Mental Status: She is alert. Mental status is at baseline.  Psychiatric:        Mood and Affect: Mood normal.        Behavior: Behavior normal.        Thought Content: Thought content normal.        Judgment: Judgment normal.     Lab Results  Component Value Date   WBC 5.1 09/11/2024   HGB 12.5 09/11/2024   HCT 36.4 09/11/2024   PLT 288.0 09/11/2024   GLUCOSE 94 07/01/2024   CHOL 160 06/28/2021   TRIG 177.0 (H) 06/28/2021   HDL 40.40 06/28/2021   LDLCALC 84 06/28/2021   ALT 19 09/11/2024   AST 20 09/11/2024   NA 138 07/01/2024   K 3.7 07/01/2024   CL 105 07/01/2024   CREATININE 0.69 07/01/2024   BUN 9 07/01/2024   CO2 23 07/01/2024   TSH 2.45 09/11/2024   INR 1.1 07/01/2024   HGBA1C 5.8 06/28/2021    IR US  Guide Bx Asp/Drain Result Date: 07/01/2024 INDICATION: Fall down steps approximately 2 months ago with right buttock injury and some persistent discomfort and palpable lump. Ultrasound recently demonstrated an elongated subcutaneous fluid collection EXAM: ULTRASOUND-GUIDED ASPIRATION RIGHT BUTTOCK SUBCUTANEOUS FLUID COLLECTION MEDICATIONS: None ANESTHESIA/SEDATION: Moderate (conscious) sedation was employed during this  procedure. A total of Versed  2.0 mg and Fentanyl  100 mcg was administered intravenously by the radiology nurse. Total intra-service moderate Sedation Time: 12 minutes. The patient's level of consciousness and vital signs were monitored continuously by radiology nursing throughout the procedure under my direct supervision. COMPLICATIONS: None immediate. PROCEDURE: Informed written consent was obtained from the patient after a thorough discussion of the procedural risks, benefits and alternatives. All questions were addressed. Maximal Sterile Barrier Technique was utilized including caps, mask, sterile gowns, sterile gloves, sterile drape, hand hygiene and skin antiseptic. Local anesthesia was provided with 1% lidocaine . A timeout was performed prior to the initiation of the procedure. After localizing a right buttock subcutaneous fluid collection, a 5 French  Yueh centesis catheter was inserted into the fluid collection under ultrasound guidance. Aspiration drainage was then performed through the 5 French catheter into an evacuation bag. A 20 mL fluid sample was sent for culture analysis. Additional ultrasound was performed after aspiration drainage. FINDINGS: Similar to the recent diagnostic ultrasound, an elongated mildly complex fluid collection was identified within the subcutaneous fat of the right buttock region. Aspiration yielded orange colored fluid with a total volume of approximately 100 mL of fluid aspirated. This resulted in complete decompression of the fluid collection by ultrasound. IMPRESSION: Ultrasound-guided aspiration drainage of elongated subcutaneous fluid collection in the right buttock region yielding 100 mL of orange colored fluid and resulting in complete decompression. A fluid sample was sent for culture analysis. Electronically Signed   By: Marcey Moan M.D.   On: 07/01/2024 16:52    Assessment & Plan:   Primary hypertension- Her BP is well controlled.  Class 2 severe obesity due  to excess calories with serious comorbidity and body mass index (BMI) of 37.0 to 37.9 in adult -     Contrave ; Start 1 tablet every morning for 7 days, then 1 tablet twice daily for 7 days, then 2 tablets every morning and one every evening  Dispense: 120 tablet; Refill: 3  Encounter for general adult medical examination with abnormal findings- Exam completed, labs reviewed, vaccines reviewed, cancer screenings are UTD, pt ed material was given.      Follow-up: Return in about 6 months (around 03/30/2025).  Debby Molt, MD "

## 2024-10-04 ENCOUNTER — Ambulatory Visit (HOSPITAL_COMMUNITY)
Admission: RE | Admit: 2024-10-04 | Discharge: 2024-10-04 | Disposition: A | Source: Ambulatory Visit | Attending: General Surgery | Admitting: General Surgery

## 2024-10-04 ENCOUNTER — Other Ambulatory Visit: Payer: Self-pay

## 2024-10-04 DIAGNOSIS — Z01818 Encounter for other preprocedural examination: Secondary | ICD-10-CM

## 2024-10-04 DIAGNOSIS — T148XXA Other injury of unspecified body region, initial encounter: Secondary | ICD-10-CM | POA: Insufficient documentation

## 2024-10-04 MED ORDER — LIDOCAINE HCL (PF) 1 % IJ SOLN
10.0000 mL | Freq: Once | INTRAMUSCULAR | Status: AC
  Administered 2024-10-04: 10 mL via INTRADERMAL

## 2024-10-04 NOTE — Procedures (Signed)
 Pre procedural Dx: right gluteal seroma Post procedural Dx: Same  Technically successful US  guided aspiration of the gluteal seroma  yielding 15mL clear yellow fluid.       EBL: Trace Complications: None immediate  KANDICE Banner, MD Pager #: [phone]

## 2024-10-13 ENCOUNTER — Other Ambulatory Visit: Payer: Self-pay | Admitting: Internal Medicine

## 2024-10-13 DIAGNOSIS — F909 Attention-deficit hyperactivity disorder, unspecified type: Secondary | ICD-10-CM

## 2024-10-13 DIAGNOSIS — F5081 Binge eating disorder, mild: Secondary | ICD-10-CM

## 2024-10-14 ENCOUNTER — Other Ambulatory Visit (HOSPITAL_BASED_OUTPATIENT_CLINIC_OR_DEPARTMENT_OTHER): Payer: Self-pay

## 2024-10-14 MED ORDER — LISDEXAMFETAMINE DIMESYLATE 50 MG PO CAPS
50.0000 mg | ORAL_CAPSULE | Freq: Every day | ORAL | 0 refills | Status: DC
  Filled 2024-10-14 – 2024-10-15 (×2): qty 30, 30d supply, fill #0

## 2024-10-15 ENCOUNTER — Other Ambulatory Visit (HOSPITAL_BASED_OUTPATIENT_CLINIC_OR_DEPARTMENT_OTHER): Payer: Self-pay

## 2024-11-11 ENCOUNTER — Encounter: Payer: Self-pay | Admitting: Internal Medicine

## 2024-11-13 ENCOUNTER — Other Ambulatory Visit: Payer: Self-pay

## 2024-11-13 ENCOUNTER — Other Ambulatory Visit (HOSPITAL_COMMUNITY): Payer: Self-pay

## 2024-11-13 ENCOUNTER — Other Ambulatory Visit: Payer: Self-pay | Admitting: Internal Medicine

## 2024-11-13 DIAGNOSIS — F5081 Binge eating disorder, mild: Secondary | ICD-10-CM

## 2024-11-13 DIAGNOSIS — F909 Attention-deficit hyperactivity disorder, unspecified type: Secondary | ICD-10-CM

## 2024-11-13 DIAGNOSIS — F331 Major depressive disorder, recurrent, moderate: Secondary | ICD-10-CM

## 2024-11-13 MED ORDER — VILAZODONE HCL 20 MG PO TABS
1.0000 | ORAL_TABLET | Freq: Every day | ORAL | 0 refills | Status: DC
  Filled 2024-11-13: qty 90, 90d supply, fill #0

## 2024-11-13 NOTE — Telephone Encounter (Signed)
 Lower the dose to 20 mg per day We can slowly taper off

## 2024-11-14 ENCOUNTER — Other Ambulatory Visit: Payer: Self-pay | Admitting: Internal Medicine

## 2024-11-14 ENCOUNTER — Other Ambulatory Visit (HOSPITAL_COMMUNITY): Payer: Self-pay

## 2024-11-14 ENCOUNTER — Other Ambulatory Visit: Payer: Self-pay

## 2024-11-14 DIAGNOSIS — F5081 Binge eating disorder, mild: Secondary | ICD-10-CM

## 2024-11-14 MED ORDER — LISDEXAMFETAMINE DIMESYLATE 60 MG PO CAPS
60.0000 mg | ORAL_CAPSULE | ORAL | 0 refills | Status: AC
  Filled 2024-11-14: qty 30, 30d supply, fill #0

## 2024-11-18 ENCOUNTER — Other Ambulatory Visit: Payer: Self-pay

## 2024-11-25 ENCOUNTER — Telehealth: Payer: Self-pay

## 2024-11-25 ENCOUNTER — Encounter: Payer: Self-pay | Admitting: Family Medicine

## 2024-11-25 ENCOUNTER — Other Ambulatory Visit: Payer: Self-pay | Admitting: Family Medicine

## 2024-11-25 ENCOUNTER — Other Ambulatory Visit: Payer: Self-pay | Admitting: Internal Medicine

## 2024-11-25 ENCOUNTER — Other Ambulatory Visit (HOSPITAL_BASED_OUTPATIENT_CLINIC_OR_DEPARTMENT_OTHER): Payer: Self-pay

## 2024-11-25 ENCOUNTER — Ambulatory Visit: Admitting: Family Medicine

## 2024-11-25 VITALS — BP 128/80 | HR 100 | Temp 98.0°F | Resp 16 | Ht 67.0 in | Wt 237.0 lb

## 2024-11-25 DIAGNOSIS — Z23 Encounter for immunization: Secondary | ICD-10-CM | POA: Diagnosis not present

## 2024-11-25 DIAGNOSIS — F339 Major depressive disorder, recurrent, unspecified: Secondary | ICD-10-CM | POA: Diagnosis not present

## 2024-11-25 DIAGNOSIS — W540XXA Bitten by dog, initial encounter: Secondary | ICD-10-CM | POA: Diagnosis not present

## 2024-11-25 DIAGNOSIS — F331 Major depressive disorder, recurrent, moderate: Secondary | ICD-10-CM

## 2024-11-25 MED ORDER — VORTIOXETINE HBR 5 MG PO TABS
5.0000 mg | ORAL_TABLET | Freq: Every day | ORAL | 2 refills | Status: DC
  Filled 2024-11-25: qty 30, 30d supply, fill #0

## 2024-11-25 MED ORDER — DOXYCYCLINE HYCLATE 100 MG PO TABS
100.0000 mg | ORAL_TABLET | Freq: Two times a day (BID) | ORAL | 0 refills | Status: DC
  Filled 2024-11-25: qty 20, 10d supply, fill #0

## 2024-11-25 MED ORDER — SERTRALINE HCL 25 MG PO TABS
25.0000 mg | ORAL_TABLET | Freq: Every day | ORAL | 3 refills | Status: AC
  Filled 2024-11-25: qty 30, 30d supply, fill #0

## 2024-11-25 NOTE — Progress Notes (Signed)
 Chief Complaint  Patient presents with   Animal Bite    Right Finger Dog Bite    Victoria Wright is a 34 y.o. female here for a skin complaint.  Duration: 1 week Location: R hand Pruritic? No Painful? Yes Drainage? Yes- green pus Trauma? Yes- bit by her puppy (UTD w vaccinations) Other associated symptoms: no fevers or spreading redness Therapies tried thus far: peroxide  Depression History depression currently on Vraylar  1.5 mg daily.  Compliant, no adverse effects.  She has failed Wellbutrin , Lexapro , Cymbalta , BuSpar , and most recently Viibryd .  Viibryd  gave her vivid dreams though did work well.  When she stopped it, she was not put on any alternative.  No homicidal or suicidal ideation.  No self-medication.  She is not seeing a therapist.  She is not exercising routinely.  Past Medical History:  Diagnosis Date   Abnormal Pap smear    follow up was wnl   Acne    Anxiety    Depression    Hypotension    taken out of work r/t bp drops   UTI (lower urinary tract infection)    Vaginal Pap smear, abnormal     BP 128/80 (BP Location: Left Arm, Patient Position: Sitting)   Pulse 100   Temp 98 F (36.7 C) (Oral)   Resp 16   Ht 5' 7 (1.702 m)   Wt 237 lb (107.5 kg)   SpO2 96%   BMI 37.12 kg/m  Gen: awake, alert, appearing stated age Lungs: No accessory muscle use Skin: Excoriated lacerations and puncture wound on the right second digit on the dorsum.  Surrounding tissue is purplish without erythema, excessive warmth.  Mild TTP.SABRA No drainage, or fluctuance. Psych: Age appropriate judgment and insight  Depression, recurrent - Plan: vortioxetine  HBr (TRINTELLIX ) 5 MG TABS tablet  Dog bite, initial encounter - Plan: doxycycline  (VIBRA -TABS) 100 MG tablet  Chronic, not controlled.  Start Trintellix  5 mg daily and follow-up in 6 weeks.  Counseled on exercise.  Counseling information provided.  Continue Vraylar  1.5 mg daily.  She will let me know if there are any  issues. 10 days of doxycycline  for prophylaxis.  Aftercare/warning signs verbalized and written down.  Tetanus shot updated today.  No concerns of rabies and the dog as she was the owner. The patient voiced understanding and agreement to the plan.  Mabel Mt Hepzibah, DO 11/25/24 10:49 AM

## 2024-11-25 NOTE — Telephone Encounter (Signed)
 done

## 2024-11-25 NOTE — Patient Instructions (Addendum)
 When you do wash it, use only soap and water. Do not vigorously scrub. Keep the area clean and dry.   Things to look out for: increasing pain not relieved by ibuprofen /acetaminophen , fevers, spreading redness, drainage of pus, or foul odor.  Aim to do some physical exertion for 150 minutes per week. This is typically divided into 5 days per week, 30 minutes per day. The activity should be enough to get your heart rate up. Anything is better than nothing if you have time constraints.  Please consider counseling. Contact [phone] to schedule an appointment or inquire about cost/insurance coverage.  [facility] located at [address], [place], [place].  Phone number = [phone].  Dr. Verdel Silk - Adult Psychiatry.    [facility] located at [address] [place], [place], [place]. Phone number = [phone].   [facility] located at [address] [place], [place].  Phone number = [phone].   [facility] located at [address] [place], [place], [place].  Phone number = [phone].  Let us  know if you need anything.

## 2024-11-27 ENCOUNTER — Other Ambulatory Visit (HOSPITAL_BASED_OUTPATIENT_CLINIC_OR_DEPARTMENT_OTHER): Payer: Self-pay

## 2024-11-27 ENCOUNTER — Other Ambulatory Visit (HOSPITAL_COMMUNITY): Payer: Self-pay

## 2024-11-27 ENCOUNTER — Other Ambulatory Visit: Payer: Self-pay

## 2024-11-27 ENCOUNTER — Other Ambulatory Visit: Payer: Self-pay | Admitting: Family Medicine

## 2024-11-27 MED ORDER — CARIPRAZINE HCL 1.5 MG PO CAPS
1.5000 mg | ORAL_CAPSULE | Freq: Every day | ORAL | 0 refills | Status: AC
  Filled 2024-11-27: qty 90, 90d supply, fill #0

## 2024-11-27 MED ORDER — SULFAMETHOXAZOLE-TRIMETHOPRIM 800-160 MG PO TABS
1.0000 | ORAL_TABLET | Freq: Two times a day (BID) | ORAL | 0 refills | Status: AC
  Filled 2024-11-27: qty 14, 7d supply, fill #0

## 2024-12-02 ENCOUNTER — Other Ambulatory Visit (HOSPITAL_BASED_OUTPATIENT_CLINIC_OR_DEPARTMENT_OTHER): Payer: Self-pay

## 2024-12-12 ENCOUNTER — Ambulatory Visit: Admitting: Internal Medicine

## 2024-12-12 ENCOUNTER — Other Ambulatory Visit (HOSPITAL_BASED_OUTPATIENT_CLINIC_OR_DEPARTMENT_OTHER): Payer: Self-pay
